# Patient Record
Sex: Female | Born: 1975
Health system: Southern US, Community
[De-identification: ages and names within clinical notes are randomized; demographics above are authoritative.]

## PROBLEM LIST (undated history)

## (undated) DIAGNOSIS — K661 Hemoperitoneum: Secondary | ICD-10-CM

## (undated) DIAGNOSIS — O00101 Right tubal pregnancy without intrauterine pregnancy: Secondary | ICD-10-CM

## (undated) DIAGNOSIS — I889 Nonspecific lymphadenitis, unspecified: Principal | ICD-10-CM

## (undated) HISTORY — PX: TONSILLECTOMY: SUR1361

## (undated) HISTORY — DX: Nonspecific lymphadenitis, unspecified: I88.9

## (undated) HISTORY — PX: BREAST SURGERY: SHX581

---

## 2005-10-31 ENCOUNTER — Ambulatory Visit (HOSPITAL_COMMUNITY): Admission: RE | Admit: 2005-10-31 | Discharge: 2005-10-31 | Payer: Self-pay | Admitting: Obstetrics and Gynecology

## 2005-11-21 ENCOUNTER — Encounter: Admission: RE | Admit: 2005-11-21 | Discharge: 2005-11-21 | Payer: Self-pay | Admitting: Obstetrics and Gynecology

## 2006-05-11 ENCOUNTER — Inpatient Hospital Stay (HOSPITAL_COMMUNITY): Admission: AD | Admit: 2006-05-11 | Discharge: 2006-05-11 | Payer: Self-pay | Admitting: Obstetrics and Gynecology

## 2006-05-14 ENCOUNTER — Inpatient Hospital Stay (HOSPITAL_COMMUNITY): Admission: AD | Admit: 2006-05-14 | Discharge: 2006-05-14 | Payer: Self-pay | Admitting: *Deleted

## 2006-12-10 ENCOUNTER — Ambulatory Visit (HOSPITAL_COMMUNITY): Admission: RE | Admit: 2006-12-10 | Discharge: 2006-12-10 | Payer: Self-pay | Admitting: Obstetrics and Gynecology

## 2007-01-07 ENCOUNTER — Ambulatory Visit (HOSPITAL_COMMUNITY): Admission: RE | Admit: 2007-01-07 | Discharge: 2007-01-07 | Payer: Self-pay | Admitting: Obstetrics and Gynecology

## 2007-03-17 ENCOUNTER — Inpatient Hospital Stay (HOSPITAL_COMMUNITY): Admission: AD | Admit: 2007-03-17 | Discharge: 2007-03-17 | Payer: Self-pay | Admitting: Obstetrics and Gynecology

## 2007-12-02 ENCOUNTER — Inpatient Hospital Stay (HOSPITAL_COMMUNITY): Admission: AD | Admit: 2007-12-02 | Discharge: 2007-12-02 | Payer: Self-pay | Admitting: Obstetrics and Gynecology

## 2007-12-13 ENCOUNTER — Encounter: Admission: RE | Admit: 2007-12-13 | Discharge: 2007-12-13 | Payer: Self-pay | Admitting: Family Medicine

## 2010-01-23 ENCOUNTER — Emergency Department (HOSPITAL_COMMUNITY): Admission: EM | Admit: 2010-01-23 | Discharge: 2010-01-23 | Payer: Self-pay | Admitting: Emergency Medicine

## 2010-08-07 ENCOUNTER — Encounter: Payer: Self-pay | Admitting: Obstetrics and Gynecology

## 2011-04-13 LAB — POCT PREGNANCY, URINE: Preg Test, Ur: NEGATIVE

## 2011-04-13 LAB — URINALYSIS, ROUTINE W REFLEX MICROSCOPIC
Bilirubin Urine: NEGATIVE
Bilirubin Urine: NEGATIVE
Glucose, UA: NEGATIVE
Ketones, ur: NEGATIVE
Ketones, ur: NEGATIVE
Nitrite: NEGATIVE
Urobilinogen, UA: 0.2
pH: 5.5
pH: 6.5

## 2011-04-13 LAB — URINE MICROSCOPIC-ADD ON

## 2011-04-13 LAB — RAPID STREP SCREEN (MED CTR MEBANE ONLY)

## 2012-10-27 ENCOUNTER — Emergency Department (HOSPITAL_COMMUNITY)
Admission: EM | Admit: 2012-10-27 | Discharge: 2012-10-27 | Disposition: A | Discharge status: Home / Self Care | Payer: 59 | Attending: Emergency Medicine | Admitting: Emergency Medicine

## 2012-10-27 ENCOUNTER — Encounter (HOSPITAL_COMMUNITY): Payer: Self-pay | Admitting: Emergency Medicine

## 2012-10-27 ENCOUNTER — Emergency Department (HOSPITAL_COMMUNITY): Payer: 59

## 2012-10-27 DIAGNOSIS — R509 Fever, unspecified: Secondary | ICD-10-CM | POA: Insufficient documentation

## 2012-10-27 DIAGNOSIS — Z3202 Encounter for pregnancy test, result negative: Secondary | ICD-10-CM | POA: Insufficient documentation

## 2012-10-27 DIAGNOSIS — R071 Chest pain on breathing: Secondary | ICD-10-CM | POA: Insufficient documentation

## 2012-10-27 DIAGNOSIS — R0789 Other chest pain: Secondary | ICD-10-CM

## 2012-10-27 DIAGNOSIS — R630 Anorexia: Secondary | ICD-10-CM | POA: Insufficient documentation

## 2012-10-27 LAB — BASIC METABOLIC PANEL
BUN: 6 mg/dL (ref 6–23)
Chloride: 97 mEq/L (ref 96–112)
Creatinine, Ser: 0.79 mg/dL (ref 0.50–1.10)
GFR calc Af Amer: >90 mL/min (ref >90)
Glucose, Bld: 101 mg/dL — ABNORMAL HIGH (ref 70–99)

## 2012-10-27 LAB — CBC WITH DIFFERENTIAL/PLATELET
Basophils Absolute: 0 10*3/uL (ref 0.0–0.1)
Basophils Relative: 1 % (ref 0–1)
Eosinophils Absolute: 0 10*3/uL (ref 0.0–0.7)
Eosinophils Relative: 0 % (ref 0–5)
HCT: 36.3 % (ref 36.0–46.0)
MCH: 28.4 pg (ref 26.0–34.0)
MCHC: 35.5 g/dL (ref 30.0–36.0)
Monocytes Absolute: 0.5 10*3/uL (ref 0.1–1.0)
Neutro Abs: 1.3 10*3/uL — ABNORMAL LOW (ref 1.7–7.7)
RDW: 13 % (ref 11.5–15.5)

## 2012-10-27 MED ORDER — IOHEXOL 300 MG/ML  SOLN
100.0000 mL | Freq: Once | INTRAMUSCULAR | Status: AC | PRN
  Administered 2012-10-27: 100 mL via INTRAVENOUS

## 2012-10-27 MED ORDER — ACETAMINOPHEN 500 MG PO TABS
1000.0000 mg | ORAL_TABLET | Freq: Once | ORAL | Status: AC
  Administered 2012-10-27: 1000 mg via ORAL
  Filled 2012-10-27: qty 2

## 2012-10-27 NOTE — ED Notes (Signed)
Pt. Stated, i've had a lump to come up on the rt. Side of my neck since Tuesday.  I've been running a fever , no appetite.

## 2012-10-27 NOTE — ED Notes (Signed)
CT called to pick up pt. IV placed.

## 2012-10-27 NOTE — ED Notes (Signed)
Attempted to place SL x 1.  Pt cried for 15 minutes.  Unable to find access greater than 22.  IV team paged.

## 2012-10-27 NOTE — ED Provider Notes (Signed)
History     CSN: [id number]  Arrival date & time 10/27/12  1044   First MD Initiated Contact with Patient 10/27/12 1124      Chief Complaint  Patient presents with  . Mass    (Consider location/radiation/quality/duration/timing/severity/associated sxs/prior treatment) HPI.....Marland Kitchensensation of lump in the right superior medial chest since Tuesday with associated fever.  No sore throat, earache, stiff neck, neuro deficits, cough, anterior chest pain.  Palpation makes symptoms worse. Severity is moderate. Patient has diminished appetite but is ambulatory and alert.  History reviewed. No pertinent past medical history.  Past Surgical History  Procedure Laterality Date  . Breast surgery      breast reduction    No family history on file.  History  Substance Use Topics  . Smoking status: Not on file  . Smokeless tobacco: Not on file  . Alcohol Use: Yes    OB History   Grav Para Term Preterm Abortions TAB SAB Ect Mult Living                  Review of Systems  All other systems reviewed and are negative.    Allergies  Penicillins  Home Medications   Current Outpatient Rx  Name  Route  Sig  Dispense  Refill  . cephALEXin (KEFLEX) 500 MG capsule   Oral   Take 500 mg by mouth 3 (three) times daily. For 7 days started 4.7.14 ending 4.14.14         . cholecalciferol (VITAMIN D) 1000 UNITS tablet   Oral   Take 1,000 Units by mouth daily.         . Prenatal Vit-Fe Fumarate-FA (PRENATAL MULTIVITAMIN) TABS   Oral   Take 1 tablet by mouth daily at 12 noon.           BP 107/78  Pulse 123  Temp(Src) 99 F (37.2 C) (Oral)  Resp 20  SpO2 99%  LMP 10/13/2012  Physical Exam  Nursing note and vitals reviewed. Constitutional: She is oriented to person, place, and time. She appears well-developed and well-nourished.  HENT:  Head: Normocephalic and atraumatic.  Eyes: Conjunctivae and EOM are normal. Pupils are equal, round, and reactive to light.  Neck: Normal  range of motion. Neck supple.  Cardiovascular: Normal rate, regular rhythm and normal heart sounds.   Pulmonary/Chest: Effort normal and breath sounds normal.  Chest wall examined.   Site of tenderness is just lateral to the right sternocleidomastoid muscle at the insertion to the clavicle and posterior to the clavicle.  Abdominal: Soft. Bowel sounds are normal.  Musculoskeletal: Normal range of motion.  Neurological: She is alert and oriented to person, place, and time.  Skin: Skin is warm and dry.  Psychiatric: She has a normal mood and affect.    ED Course  Procedures (including critical care time)  Labs Reviewed  BASIC METABOLIC PANEL - Abnormal; Notable for the following:    Sodium 132 (*)    Glucose, Bld 101 (*)    All other components within normal limits  CBC WITH DIFFERENTIAL - Abnormal; Notable for the following:    Neutrophils Relative 32 (*)    Neutro Abs 1.3 (*)    Lymphocytes Relative 56 (*)    All other components within normal limits  PREGNANCY, URINE   Ct Soft Tissue Neck W Contrast  10/27/2012  *RADIOLOGY REPORT*  Clinical Data: Right-sided neck swelling.  CT NECK WITH CONTRAST  Technique:  Multidetector CT imaging of the neck was performed with  intravenous contrast.  Contrast: OMNIPAQUE IOHEXOL 300 MG/ML  SOLN  Comparison: None.  Findings: Major and minor salivary glands unremarkable.  No mucosal lesion or fluid accumulation in the neck.  No pathologic adenopathy.  No vascular occlusion.  No significant cellulitis or visible thrombophlebitis.  No acute osseous findings.  Retention cyst left sphenoid sinus 8 x 13 mm without air-fluid level. Normal thyroid No acute sinus disease.  No acute osseous findings.  IMPRESSION: Unremarkable CT neck with contrast.  No mass or significant inflammatory process is evident.   Original Report Authenticated By: Davonna Belling, M.D.    Ct Chest W Contrast  10/27/2012  *RADIOLOGY REPORT*  Clinical Data: Soft tissue mass involving the  superior medial right chest.  Right sided neck swelling.  CT CHEST WITH CONTRAST  Technique:  Multidetector CT imaging of the chest was performed following the standard protocol during bolus administration of intravenous contrast.  Contrast: OMNIPAQUE IOHEXOL 300 MG/ML IV.  Comparison: No prior chest CT.  Neck CT performed concurrently.  Findings: Pulmonary parenchyma clear without evidence of localized airspace consolidation, interstitial disease, or parenchymal nodules or masses.  No pleural effusions.  Central airways patent without significant bronchial wall thickening.  Heart size normal.  No pericardial effusion.  No visible coronary atherosclerosis.  No visible atherosclerosis involving the thoracic or upper abdominal aorta or their visualized branches.  No significant mediastinal, hilar, or axillary lymphadenopathy. Visualized thyroid gland normal in appearance.  Beam hardening streak artifact involving the upper abdomen as the patient was unable to raise the arms.  Despite this limitation, visualized upper abdomen unremarkable.  Bone window images unremarkable.  IMPRESSION: Normal examination.   Original Report Authenticated By: Hulan Saas, M.D.      1. Fever   2. Chest wall tenderness       MDM  White count is normal.   CT scan of the neck and chest were negative for mass or abscess.  Discussed case with Dr. Cliffton Asters infectious disease. Patient will followup in his clinic. Patient understands to return to emergency department anytime if worse.        Donnetta Hutching, MD 10/27/12 [phone]

## 2012-10-29 ENCOUNTER — Ambulatory Visit (INDEPENDENT_AMBULATORY_CARE_PROVIDER_SITE_OTHER): Payer: 59 | Admitting: Infectious Diseases

## 2012-10-29 ENCOUNTER — Encounter: Payer: Self-pay | Admitting: Infectious Diseases

## 2012-10-29 VITALS — BP 114/81 | HR 125 | Temp 98.6°F | Ht 70.0 in | Wt 216.0 lb

## 2012-10-29 DIAGNOSIS — R509 Fever, unspecified: Secondary | ICD-10-CM

## 2012-10-29 LAB — RPR

## 2012-10-29 LAB — COMPREHENSIVE METABOLIC PANEL
ALT: 32 U/L (ref 0–35)
Alkaline Phosphatase: 32 U/L — ABNORMAL LOW (ref 39–117)
Creat: 0.92 mg/dL (ref 0.50–1.10)
Sodium: 135 mEq/L (ref 135–145)
Total Bilirubin: 0.3 mg/dL (ref 0.3–1.2)
Total Protein: 8 g/dL (ref 6.0–8.3)

## 2012-10-29 LAB — TSH: TSH: 0.86 u[IU]/mL (ref 0.350–4.500)

## 2012-10-29 NOTE — Progress Notes (Signed)
  Subjective:    Patient ID: Nicole Alexander, female    DOB: 1976-03-21, 37 y.o.   MRN: [id number]  HPI  37yo F with FUO for the last week. Had fever up to 103 this AM. She had a R cervical LN noted and was seen in urgent care on 4-9. Had influenza/strep thoat (-) and was put on Kephlex. Swelling in neck has improved but LN still hurts.   Was seen in ED on -12 and had CT neck and chest (-) as well as lab working revealing  Mild decrease in NA and mild increase in Lymphocytes.   Review of Systems  Constitutional: Positive for fever, appetite change and unexpected weight change.  HENT: Negative for mouth sores and trouble swallowing.   Respiratory: Negative for cough and shortness of breath.   Gastrointestinal: Positive for diarrhea. Negative for nausea.  Genitourinary: Negative for difficulty urinating and menstrual problem.  Musculoskeletal: Negative for myalgias and arthralgias.  Hematological: Positive for adenopathy.  lost 7# Feels like she has a "coated tongue"     Objective:   Physical Exam  Constitutional: She appears well-developed and well-nourished.  HENT:  Mouth/Throat: No oropharyngeal exudate.  Eyes: EOM are normal. Pupils are equal, round, and reactive to light.  Neck: Neck supple.  Cardiovascular: Normal rate, regular rhythm and normal heart sounds.   Pulmonary/Chest: Effort normal and breath sounds normal.  Abdominal: Soft. Bowel sounds are normal. There is no tenderness.  Musculoskeletal: Normal range of motion. She exhibits no edema and no tenderness.  Lymphadenopathy:    She has cervical adenopathy.       Right cervical: Superficial cervical adenopathy present.    She has axillary adenopathy.       Right axillary: Pectoral adenopathy present.       Left axillary: No pectoral and no lateral adenopathy present.         Assessment & Plan:

## 2012-10-29 NOTE — Addendum Note (Signed)
Addended by: Jersey Espinoza C on: 10/29/2012 02:41 PM   Modules accepted: Orders

## 2012-10-29 NOTE — Addendum Note (Signed)
Addended by: Mariea Clonts D on: 10/29/2012 03:25 PM   Modules accepted: Orders

## 2012-10-29 NOTE — Assessment & Plan Note (Signed)
The etiology of her syndrome is unclear. There are several syndromes which can give fever and LAN- acute HIV, Mono (EBV, CMV, Toxo), SLE. She states she has had HIV (-) recently, will repeat this. Will screen her broadly for these as well as check BCx. Explained to her if above w/u is (-), would consider LN bx. rtc 1-2 weeks

## 2012-10-30 LAB — ANA: Anti Nuclear Antibody(ANA): NEGATIVE

## 2012-10-30 LAB — EPSTEIN-BARR VIRUS VCA, IGM: EBV VCA IgM: <10 U/mL (ref <36.0)

## 2012-10-30 LAB — CBC WITH DIFFERENTIAL/PLATELET
Basophils Relative: 1 % (ref 0–1)
Eosinophils Absolute: 0 10*3/uL (ref 0.0–0.7)
Eosinophils Relative: 0 % (ref 0–5)
MCH: 27.2 pg (ref 26.0–34.0)
MCHC: 33.7 g/dL (ref 30.0–36.0)
MCV: 80.8 fL (ref 78.0–100.0)
Neutrophils Relative %: 26 % — ABNORMAL LOW (ref 43–77)
Platelets: 237 10*3/uL (ref 150–400)
RDW: 13.8 % (ref 11.5–15.5)

## 2012-10-30 LAB — EPSTEIN-BARR VIRUS VCA, IGG: EBV VCA IgG: >750 U/mL — ABNORMAL HIGH (ref <18.0)

## 2012-10-30 LAB — EPSTEIN-BARR VIRUS EARLY D ANTIGEN ANTIBODY, IGG: EBV EA IgG: <5 U/mL (ref <9.0)

## 2012-10-30 LAB — HIV-1 RNA QUANT-NO REFLEX-BLD: HIV-1 RNA Quant, Log: <1.3 {Log} (ref <1.30)

## 2012-10-31 LAB — CMV IGM: CMV IgM: <8 AU/mL (ref <30.00)

## 2012-11-04 LAB — CULTURE, BLOOD (SINGLE): Organism ID, Bacteria: NO GROWTH

## 2012-11-05 ENCOUNTER — Telehealth: Payer: Self-pay | Admitting: *Deleted

## 2012-11-05 ENCOUNTER — Encounter: Payer: Self-pay | Admitting: *Deleted

## 2012-11-05 NOTE — Telephone Encounter (Signed)
Pt's labs are unremarkable with the exception of past exposure to EBV (mono virus). If she still has swollen lymphnode would send her for biopsy.

## 2012-11-05 NOTE — Telephone Encounter (Signed)
Patient calling for lab results from last Monday.  Per patient, her symptoms remain (temperature of 101-102, no appetite, weak but drinking fluids, tongue still coated).  Patient notified that her cultures were negative.  Please advise if she needs additional lab results read to her. Andree Coss, RN

## 2012-11-06 NOTE — Telephone Encounter (Signed)
Patient notified.  Lymph nodes are tender, but no longer swollen.  Will f/u with her previously scheduled appointment Monday.

## 2012-11-12 ENCOUNTER — Encounter: Payer: Self-pay | Admitting: Infectious Diseases

## 2012-11-12 ENCOUNTER — Ambulatory Visit (INDEPENDENT_AMBULATORY_CARE_PROVIDER_SITE_OTHER): Payer: 59 | Admitting: Infectious Diseases

## 2012-11-12 VITALS — BP 118/86 | HR 84 | Temp 98.5°F | Ht 69.0 in | Wt 217.0 lb

## 2012-11-12 DIAGNOSIS — R509 Fever, unspecified: Secondary | ICD-10-CM

## 2012-11-12 LAB — CBC WITH DIFFERENTIAL/PLATELET
Basophils Absolute: 0.1 10*3/uL (ref 0.0–0.1)
Basophils Relative: 1 % (ref 0–1)
Eosinophils Absolute: 0.3 10*3/uL (ref 0.0–0.7)
Eosinophils Relative: 3 % (ref 0–5)
HCT: 34.8 % — ABNORMAL LOW (ref 36.0–46.0)
MCHC: 33.9 g/dL (ref 30.0–36.0)
MCV: 80.2 fL (ref 78.0–100.0)
Monocytes Absolute: 0.7 10*3/uL (ref 0.1–1.0)
Neutro Abs: 3.5 10*3/uL (ref 1.7–7.7)
RDW: 14 % (ref 11.5–15.5)

## 2012-11-12 NOTE — Progress Notes (Signed)
  Subjective:    Patient ID: Nicole Alexander, female    DOB: 1975/09/06, 37 y.o.   MRN: [id number]  HPI 36yo F with FUO. Had fever up to 103 prior to eval in ID clinic on 10-29-12. She had a R cervical LN noted and was seen in urgent care on 4-9. Had influenza/strep thoat (-) and was put on Kephlex. Swelling in neck has improved but LN still hurts.  Was seen in ED on 4-12 and had CT neck and chest (-) as well as lab working revealing Mild decrease in neutrophils and mild increase in Lymphocytes.  Today she feels better except she has no appetite. Her LN has resolved. Low energy levels.  Sleeping +/-. Restless.   Had mammo earlier this year.   Review of Systems  Constitutional: Negative for fever and chills.  Gastrointestinal: Negative for diarrhea and constipation.  Genitourinary: Negative for difficulty urinating.       Objective:   Physical Exam  Constitutional: She appears well-developed and well-nourished. No distress.  HENT:  Mouth/Throat: No oropharyngeal exudate.  Eyes: EOM are normal. Pupils are equal, round, and reactive to light.  Neck: Neck supple.  Cardiovascular: Normal rate, regular rhythm and normal heart sounds.   Pulmonary/Chest: Effort normal and breath sounds normal.  Abdominal: Soft. Bowel sounds are normal. There is no tenderness.  Lymphadenopathy:    She has no cervical adenopathy.          Assessment & Plan:

## 2012-11-12 NOTE — Assessment & Plan Note (Signed)
She has improved. The only thing I have to blame are her very elevated EBV Ig (although not EA and not IgM). It is possible she could have a primary blood d/o (her ANC was 900). Will repeat her CBC with diff today. If still abn, will give her anbx in the am for her dental procedure. Will also have her seen by heme.

## 2012-11-13 ENCOUNTER — Telehealth: Payer: Self-pay | Admitting: Licensed Clinical Social Worker

## 2012-11-13 NOTE — Telephone Encounter (Signed)
Her WBC has returned to normal. She can procede with surgery, normal peri-procedure anbx

## 2012-11-13 NOTE — Telephone Encounter (Signed)
Patient called wanting lab results and to know if she can have dental surgery. Please advise

## 2012-11-24 ENCOUNTER — Emergency Department (HOSPITAL_COMMUNITY): Payer: 59

## 2012-11-24 ENCOUNTER — Inpatient Hospital Stay (HOSPITAL_COMMUNITY)
Admission: EM | Admit: 2012-11-24 | Discharge: 2012-11-29 | DRG: 418 | Disposition: A | Discharge status: Home / Self Care | Payer: 59 | Attending: General Surgery | Admitting: General Surgery

## 2012-11-24 ENCOUNTER — Encounter (HOSPITAL_COMMUNITY): Payer: Self-pay | Admitting: Emergency Medicine

## 2012-11-24 DIAGNOSIS — K8042 Calculus of bile duct with acute cholecystitis without obstruction: Secondary | ICD-10-CM | POA: Diagnosis present

## 2012-11-24 DIAGNOSIS — K810 Acute cholecystitis: Secondary | ICD-10-CM

## 2012-11-24 DIAGNOSIS — K859 Acute pancreatitis without necrosis or infection, unspecified: Principal | ICD-10-CM | POA: Diagnosis present

## 2012-11-24 DIAGNOSIS — R509 Fever, unspecified: Secondary | ICD-10-CM | POA: Diagnosis present

## 2012-11-24 LAB — COMPREHENSIVE METABOLIC PANEL
AST: 198 U/L — ABNORMAL HIGH (ref 0–37)
Albumin: 3.6 g/dL (ref 3.5–5.2)
Calcium: 9.2 mg/dL (ref 8.4–10.5)
Creatinine, Ser: 0.77 mg/dL (ref 0.50–1.10)
Total Protein: 7.7 g/dL (ref 6.0–8.3)

## 2012-11-24 LAB — POCT PREGNANCY, URINE: Preg Test, Ur: NEGATIVE

## 2012-11-24 LAB — URINALYSIS, ROUTINE W REFLEX MICROSCOPIC
Nitrite: NEGATIVE
Specific Gravity, Urine: 1.01 (ref 1.005–1.030)
Urobilinogen, UA: 1 mg/dL (ref 0.0–1.0)

## 2012-11-24 LAB — URINE MICROSCOPIC-ADD ON

## 2012-11-24 LAB — LACTIC ACID, PLASMA: Lactic Acid, Venous: 1.6 mmol/L (ref 0.5–2.2)

## 2012-11-24 MED ORDER — MORPHINE SULFATE 4 MG/ML IJ SOLN
4.0000 mg | Freq: Once | INTRAMUSCULAR | Status: DC
  Administered 2012-11-24: 4 mg via INTRAVENOUS
  Filled 2012-11-24: qty 1

## 2012-11-24 MED ORDER — SODIUM CHLORIDE 0.9 % IV BOLUS (SEPSIS)
1000.0000 mL | Freq: Once | INTRAVENOUS | Status: AC
  Administered 2012-11-25: 1000 mL via INTRAVENOUS

## 2012-11-24 MED ORDER — ONDANSETRON HCL 4 MG/2ML IJ SOLN
4.0000 mg | Freq: Once | INTRAMUSCULAR | Status: AC
  Administered 2012-11-24: 4 mg via INTRAVENOUS
  Filled 2012-11-24: qty 2

## 2012-11-24 MED ORDER — HYDROMORPHONE HCL PF 1 MG/ML IJ SOLN
1.0000 mg | Freq: Once | INTRAMUSCULAR | Status: DC
  Filled 2012-11-24: qty 1

## 2012-11-24 NOTE — ED Notes (Signed)
States she went to PCP yesterday due to abdominal pain and acid reflux. Lab work was draw, PCP office called her today and told her that her liver enzymes were elevated, to come to ED if pain worsens.

## 2012-11-24 NOTE — ED Provider Notes (Signed)
History     CSN: [id number]  Arrival date & time 11/24/12  1900   First MD Initiated Contact with Patient 11/24/12 2021      Chief Complaint  Patient presents with  . Abdominal Pain    (Consider location/radiation/quality/duration/timing/severity/associated sxs/prior treatment) The history is provided by the patient and medical records. No language interpreter was used.    Nicole Alexander is a 37 y.o. female  with a no medical hx presents to the Emergency Department complaining of gradual, persistent, progressively worsening RUQ abd pain onset 5 days ago with worsening today.  Pt saw PCP yesterday and was given medications for GERD.  PCP contacted patient last night to inform patient that her liver enzymes were elevated and if her pain increased she was to present to the emergency department. Patient with increasing pain today located in the right upper quadrant and epigastric area, rated at a 10 out of 10 without radiation. Patient with associated nausea and vomiting for several days and states she is able to hold down light foods but heavy meals and decrease and his vomiting almost immediately.  By the time patient presented to the emergency Department nothing makes her pain better. Pt denies fever, chills, headache, neck pain chest pain, shortness of breath, diarrhea, weakness, dizziness, syncope, dysuria, hematuria, vaginal discharge, vaginal bleeding.    History reviewed. No pertinent past medical history.  Past Surgical History  Procedure Laterality Date  . Breast surgery      breast reduction    Family History  Problem Relation Age of Onset  . Heart disease Mother     PTCA/stent  . Hypertension Mother   . Diabetes Mother   . Hyperlipidemia Mother   . Cancer Father     lung  . Hypertension Father   . Diabetes Father   . Hyperlipidemia Father   . Hypertension Brother     History  Substance Use Topics  . Smoking status: Never Smoker   . Smokeless tobacco:  Never Used  . Alcohol Use: Yes     Comment: occasional    OB History   Grav Para Term Preterm Abortions TAB SAB Ect Mult Living                  Review of Systems  Constitutional: Negative for fever, diaphoresis, appetite change, fatigue and unexpected weight change.  HENT: Negative for mouth sores, trouble swallowing, neck pain and neck stiffness.   Respiratory: Negative for cough, chest tightness, shortness of breath, wheezing and stridor.   Cardiovascular: Negative for chest pain and palpitations.  Gastrointestinal: Positive for nausea, vomiting and abdominal pain. Negative for diarrhea, constipation, blood in stool, abdominal distention and rectal pain.  Genitourinary: Negative for dysuria, urgency, frequency, hematuria, flank pain and difficulty urinating.  Musculoskeletal: Negative for back pain.  Skin: Negative for rash.  Neurological: Negative for weakness.  Hematological: Negative for adenopathy.  Psychiatric/Behavioral: Negative for confusion.  All other systems reviewed and are negative.    Allergies  Penicillins  Home Medications   Current Outpatient Rx  Name  Route  Sig  Dispense  Refill  . cholecalciferol (VITAMIN D) 1000 UNITS tablet   Oral   Take 1,000 Units by mouth daily.         Marland Kitchen lubiprostone (AMITIZA) 8 MCG capsule   Oral   Take 8 mcg by mouth 2 (two) times daily with a meal.         . Prenatal Vit-Fe Fumarate-FA (PRENATAL MULTIVITAMIN) TABS  Oral   Take 1 tablet by mouth daily at 12 noon.         . Probiotic Product (ALIGN) 4 MG CAPS   Oral   Take 1 capsule by mouth 2 (two) times daily.           BP 128/78  Pulse 83  Temp(Src) 97.8 F (36.6 C) (Oral)  Resp 16  SpO2 100%  LMP 11/17/2012  Physical Exam  Nursing note and vitals reviewed. Constitutional: She appears well-developed and well-nourished. She appears distressed.  Ill appearing   HENT:  Head: Normocephalic and atraumatic.  Mouth/Throat: Oropharynx is clear and  moist.  Eyes: Conjunctivae are normal. No scleral icterus.  Cardiovascular: Normal rate, regular rhythm, normal heart sounds and intact distal pulses.   No murmur heard. Pulmonary/Chest: Effort normal and breath sounds normal. No respiratory distress. She has no wheezes. She has no rales. She exhibits no tenderness.  Abdominal: Soft. Normal appearance and bowel sounds are normal. She exhibits no distension and no mass. There is tenderness in the right upper quadrant, epigastric area and left upper quadrant. There is guarding and positive Murphy's sign. There is no rigidity, no rebound, no CVA tenderness and no tenderness at McBurney's point.  Significant tenderness to the RUQ and epigastric region on palpation  Neurological: She is alert.  Skin: Skin is warm and dry.  Psychiatric: She has a normal mood and affect.    ED Course  Procedures (including critical care time)  Labs Reviewed  COMPREHENSIVE METABOLIC PANEL - Abnormal; Notable for the following:    Glucose, Bld 165 (*)    BUN 4 (*)    AST 198 (*)    ALT 590 (*)    Alkaline Phosphatase 133 (*)    Total Bilirubin 2.1 (*)    All other components within normal limits  LIPASE, BLOOD - Abnormal; Notable for the following:    Lipase >3000 (*)    All other components within normal limits  URINALYSIS, ROUTINE W REFLEX MICROSCOPIC - Abnormal; Notable for the following:    Color, Urine AMBER (*)    APPearance CLOUDY (*)    Bilirubin Urine MODERATE (*)    Leukocytes, UA SMALL (*)    All other components within normal limits  URINE MICROSCOPIC-ADD ON - Abnormal; Notable for the following:    Squamous Epithelial / LPF FEW (*)    Bacteria, UA FEW (*)    All other components within normal limits  URINE CULTURE  LACTIC ACID, PLASMA  POCT PREGNANCY, URINE   Dg Chest 2 View  11/24/2012  *RADIOLOGY REPORT*  Clinical Data: Upper abdominal pain.  CHEST - 2 VIEW  Comparison: CT chest 10/27/2012.  Findings: Lungs are clear.  Heart size is  normal.  No pneumothorax or pleural fluid.  IMPRESSION: Negative chest.   Original Report Authenticated By: Holley Dexter, M.D.    US Abdomen Complete  11/25/2012  *RADIOLOGY REPORT*  Clinical Data:  Abdominal pain especially right upper quadrant, nausea, vomiting  ULTRASOUND ABDOMEN:  Technique:  Sonography of upper abdominal structures was performed.  Comparison:  None  Gallbladder:  Multiple shadowing calculi within gallbladder up to 13 mm diameter.  Mild gallbladder wall thickening.  No definite pericholecystic fluid.  Sonographic Eulah Pont sign is present.  Common bile duct:  Dilated 10 mm diameter.  No discrete visualized intraluminal stone is seen.  Liver:  Echogenic, likely fatty infiltration, though this can be seen with cirrhosis and certain infiltrative disorders.  Question mild focal sparing adjacent to  the gallbladder fossa.  No discrete hepatic mass or nodularity.  Hepatopetal portal venous flow.  IVC:  Normal appearance  Pancreas:  Normal appearance  Spleen:  Limited visualization due to high position under diaphragm, approximately 4 cm length.  Right kidney:  11.7 cm length. Normal morphology without mass or hydronephrosis.  Left kidney:  11.2 cm length. Normal morphology without mass or hydronephrosis.  Aorta:  Predominately obscured by bowel gas  Other:  No free fluid  IMPRESSION: Cholelithiasis or gallbladder wall thickening and presence of a sonographic Murphy sign consistent with acute cholecystitis. Dilated CBD 10 mm diameter, cannot exclude distal obstruction by mass or calculus; recommend correlation with LFTs. Probable fatty infiltration of liver as above.   Original Report Authenticated By: Ulyses Southward, M.D.      1. Acute cholecystitis   2. Pancreatitis       MDM  Nicole Alexander presents with abdominal pain, RUQ in nature.  Pt with intense pain, significant TTP. Hx and PE concerning for gallstones, pancreatitis, and/or acute cholecystitis.  Pt afebrile, without  tachycardia or hypoxia on exam.  Will give pain control, antiemetics and keep NPO pending labs and imaging.  Pt with lipase >3000 indicative of pancreatitis.  CBC with mildly elevated white blood cell count of 11.5. CMP with elevated transaminases, alk phos and bilirubin.  [place] abd with evidence of acute cholecystitis along with a dilated common bile duct concerning for choledocolithiasis.  UA without evidence of a UTI, lactic acid WNL.  CXR unremarkable.  I personally reviewed the imaging tests through PACS system.  I reviewed available ER/hospitalization records through the EMR.  Pt will need admission and general surgery consult.  Pt does not meet SIRS or Sepsis criteria.    Pt pain controlled at this time.  Discussed with Dr Magnus Ivan of general surgery who will admit.            Dahlia Client Kobie Whidby, PA-C 11/25/12 0127  Dierdre Forth, PA-C 11/25/12 1610

## 2012-11-24 NOTE — ED Notes (Signed)
Pt states abdominal pain bilateral upper quadrants underneath ribs.

## 2012-11-25 DIAGNOSIS — K859 Acute pancreatitis without necrosis or infection, unspecified: Secondary | ICD-10-CM

## 2012-11-25 DIAGNOSIS — K8020 Calculus of gallbladder without cholecystitis without obstruction: Secondary | ICD-10-CM

## 2012-11-25 LAB — COMPREHENSIVE METABOLIC PANEL
ALT: 500 U/L — ABNORMAL HIGH (ref 0–35)
Calcium: 9.1 mg/dL (ref 8.4–10.5)
Creatinine, Ser: 0.63 mg/dL (ref 0.50–1.10)
GFR calc Af Amer: >90 mL/min (ref >90)
Glucose, Bld: 118 mg/dL — ABNORMAL HIGH (ref 70–99)
Sodium: 137 mEq/L (ref 135–145)
Total Protein: 7 g/dL (ref 6.0–8.3)

## 2012-11-25 LAB — CBC WITH DIFFERENTIAL/PLATELET
Basophils Absolute: 0 10*3/uL (ref 0.0–0.1)
Basophils Relative: 0 % (ref 0–1)
Hemoglobin: 11.5 g/dL — ABNORMAL LOW (ref 12.0–15.0)
MCHC: 33.3 g/dL (ref 30.0–36.0)
Monocytes Relative: 8 % (ref 3–12)
Neutro Abs: 7.8 10*3/uL — ABNORMAL HIGH (ref 1.7–7.7)
Neutrophils Relative %: 68 % (ref 43–77)

## 2012-11-25 LAB — CBC
Hemoglobin: 10.8 g/dL — ABNORMAL LOW (ref 12.0–15.0)
MCH: 27.5 pg (ref 26.0–34.0)
Platelets: 270 10*3/uL (ref 150–400)
RBC: 3.93 MIL/uL (ref 3.87–5.11)
WBC: 10.8 10*3/uL — ABNORMAL HIGH (ref 4.0–10.5)

## 2012-11-25 MED ORDER — CIPROFLOXACIN IN D5W 400 MG/200ML IV SOLN
400.0000 mg | Freq: Two times a day (BID) | INTRAVENOUS | Status: DC
  Administered 2012-11-25 – 2012-11-29 (×9): 400 mg via INTRAVENOUS
  Filled 2012-11-25 (×10): qty 200

## 2012-11-25 MED ORDER — ONDANSETRON HCL 4 MG/2ML IJ SOLN
4.0000 mg | Freq: Four times a day (QID) | INTRAMUSCULAR | Status: DC | PRN
  Administered 2012-11-26 – 2012-11-28 (×4): 4 mg via INTRAVENOUS
  Filled 2012-11-25 (×3): qty 2

## 2012-11-25 MED ORDER — ENOXAPARIN SODIUM 40 MG/0.4ML ~~LOC~~ SOLN
40.0000 mg | SUBCUTANEOUS | Status: DC
  Administered 2012-11-26 – 2012-11-27 (×2): 40 mg via SUBCUTANEOUS
  Filled 2012-11-25 (×4): qty 0.4

## 2012-11-25 MED ORDER — PANTOPRAZOLE SODIUM 40 MG IV SOLR
40.0000 mg | Freq: Every day | INTRAVENOUS | Status: DC
  Administered 2012-11-25 – 2012-11-28 (×4): 40 mg via INTRAVENOUS
  Filled 2012-11-25 (×6): qty 40

## 2012-11-25 MED ORDER — HYDROMORPHONE HCL PF 1 MG/ML IJ SOLN
1.0000 mg | INTRAMUSCULAR | Status: DC | PRN
  Administered 2012-11-25 – 2012-11-29 (×12): 1 mg via INTRAVENOUS
  Filled 2012-11-25 (×11): qty 1

## 2012-11-25 MED ORDER — DIPHENHYDRAMINE HCL 50 MG/ML IJ SOLN
12.5000 mg | Freq: Four times a day (QID) | INTRAMUSCULAR | Status: DC | PRN
  Administered 2012-11-25 – 2012-11-28 (×8): 25 mg via INTRAVENOUS
  Filled 2012-11-25 (×8): qty 1

## 2012-11-25 MED ORDER — POTASSIUM CHLORIDE IN NACL 20-0.9 MEQ/L-% IV SOLN
INTRAVENOUS | Status: DC
  Administered 2012-11-25 – 2012-11-26 (×5): via INTRAVENOUS
  Administered 2012-11-26: 1000 mL via INTRAVENOUS
  Administered 2012-11-27 (×3): via INTRAVENOUS
  Filled 2012-11-25 (×19): qty 1000

## 2012-11-25 MED ORDER — HYDROMORPHONE HCL PF 1 MG/ML IJ SOLN
1.0000 mg | Freq: Once | INTRAMUSCULAR | Status: AC
  Administered 2012-11-25: 1 mg via INTRAVENOUS

## 2012-11-25 NOTE — H&P (Signed)
Nicole Alexander is an 37 y.o. female.   Chief Complaint: Right upper quadrant and epigastric abdominal pain HPI: This is a pleasant 37 year old female who presents with a several-day history of sharp and crampy right upper quadrant and epigastric abdominal pain. She has had some nausea and vomiting. She has also had fevers. She has just gotten over mononucleosis. Bowel movements are normal. The pain refers through to the back this moderate in intensity  History reviewed. No pertinent past medical history.  Past Surgical History  Procedure Laterality Date  . Breast surgery      breast reduction    Family History  Problem Relation Age of Onset  . Heart disease Mother     PTCA/stent  . Hypertension Mother   . Diabetes Mother   . Hyperlipidemia Mother   . Cancer Father     lung  . Hypertension Father   . Diabetes Father   . Hyperlipidemia Father   . Hypertension Brother    Social History:  reports that she has never smoked. She has never used smokeless tobacco. She reports that  drinks alcohol. She reports that she does not use illicit drugs.  Allergies:  Allergies  Allergen Reactions  . Penicillins Swelling    "swole up like a balloon"    Medications Prior to Admission  Medication Sig Dispense Refill  . cholecalciferol (VITAMIN D) 1000 UNITS tablet Take 1,000 Units by mouth daily.      Marland Kitchen lubiprostone (AMITIZA) 8 MCG capsule Take 8 mcg by mouth 2 (two) times daily with a meal.      . Prenatal Vit-Fe Fumarate-FA (PRENATAL MULTIVITAMIN) TABS Take 1 tablet by mouth daily at 12 noon.      . Probiotic Product (ALIGN) 4 MG CAPS Take 1 capsule by mouth 2 (two) times daily.        Results for orders placed during the hospital encounter of 11/24/12 (from the past 48 hour(s))  COMPREHENSIVE METABOLIC PANEL     Status: Abnormal   Collection Time    11/24/12  8:02 PM      Result Value Range   Sodium 139  135 - 145 mEq/L   Potassium 3.6  3.5 - 5.1 mEq/L   Chloride 103  96 - 112  mEq/L   CO2 28  19 - 32 mEq/L   Glucose, Bld 165 (*) 70 - 99 mg/dL   BUN 4 (*) 6 - 23 mg/dL   Creatinine, Ser 4.09  0.50 - 1.10 mg/dL   Calcium 9.2  8.4 - 81.1 mg/dL   Total Protein 7.7  6.0 - 8.3 g/dL   Albumin 3.6  3.5 - 5.2 g/dL   AST 914 (*) 0 - 37 U/L   ALT 590 (*) 0 - 35 U/L   Alkaline Phosphatase 133 (*) 39 - 117 U/L   Total Bilirubin 2.1 (*) 0.3 - 1.2 mg/dL   GFR calc non Af Amer >90  >90 mL/min   GFR calc Af Amer >90  >90 mL/min   Comment:            The eGFR has been calculated     using the CKD EPI equation.     This calculation has not been     validated in all clinical     situations.     eGFR's persistently     <90 mL/min signify     possible Chronic Kidney Disease.  LIPASE, BLOOD     Status: Abnormal   Collection Time  11/24/12  8:02 PM      Result Value Range   Lipase >3000 (*) 11 - 59 U/L  LACTIC ACID, PLASMA     Status: None   Collection Time    11/24/12  8:03 PM      Result Value Range   Lactic Acid, Venous 1.6  0.5 - 2.2 mmol/L  POCT PREGNANCY, URINE     Status: None   Collection Time    11/24/12  8:20 PM      Result Value Range   Preg Test, Ur NEGATIVE  NEGATIVE   Comment:            THE SENSITIVITY OF THIS     METHODOLOGY IS >24 mIU/mL  URINALYSIS, ROUTINE W REFLEX MICROSCOPIC     Status: Abnormal   Collection Time    11/24/12  9:49 PM      Result Value Range   Color, Urine AMBER (*) YELLOW   Comment: BIOCHEMICALS MAY BE AFFECTED BY COLOR   APPearance CLOUDY (*) CLEAR   Specific Gravity, Urine 1.010  1.005 - 1.030   pH 5.5  5.0 - 8.0   Glucose, UA NEGATIVE  NEGATIVE mg/dL   Hgb urine dipstick NEGATIVE  NEGATIVE   Bilirubin Urine MODERATE (*) NEGATIVE   Ketones, ur NEGATIVE  NEGATIVE mg/dL   Protein, ur NEGATIVE  NEGATIVE mg/dL   Urobilinogen, UA 1.0  0.0 - 1.0 mg/dL   Nitrite NEGATIVE  NEGATIVE   Leukocytes, UA SMALL (*) NEGATIVE  URINE MICROSCOPIC-ADD ON     Status: Abnormal   Collection Time    11/24/12  9:49 PM      Result Value  Range   Squamous Epithelial / LPF FEW (*) RARE   WBC, UA 3-6  <3 WBC/hpf   RBC / HPF 0-2  <3 RBC/hpf   Bacteria, UA FEW (*) RARE  CBC WITH DIFFERENTIAL     Status: Abnormal   Collection Time    11/25/12  1:35 AM      Result Value Range   WBC 11.5 (*) 4.0 - 10.5 K/uL   RBC 4.19  3.87 - 5.11 MIL/uL   Hemoglobin 11.5 (*) 12.0 - 15.0 g/dL   HCT 40.9 (*) 81.1 - 91.4 %   MCV 82.3  78.0 - 100.0 fL   MCH 27.4  26.0 - 34.0 pg   MCHC 33.3  30.0 - 36.0 g/dL   RDW 78.2  95.6 - 21.3 %   Platelets 324  150 - 400 K/uL   Neutrophils Relative 68  43 - 77 %   Neutro Abs 7.8 (*) 1.7 - 7.7 K/uL   Lymphocytes Relative 22  12 - 46 %   Lymphs Abs 2.5  0.7 - 4.0 K/uL   Monocytes Relative 8  3 - 12 %   Monocytes Absolute 0.9  0.1 - 1.0 K/uL   Eosinophils Relative 2  0 - 5 %   Eosinophils Absolute 0.2  0.0 - 0.7 K/uL   Basophils Relative 0  0 - 1 %   Basophils Absolute 0.0  0.0 - 0.1 K/uL   Dg Chest 2 View  11/24/2012  *RADIOLOGY REPORT*  Clinical Data: Upper abdominal pain.  CHEST - 2 VIEW  Comparison: CT chest 10/27/2012.  Findings: Lungs are clear.  Heart size is normal.  No pneumothorax or pleural fluid.  IMPRESSION: Negative chest.   Original Report Authenticated By: Holley Dexter, M.D.    US Abdomen Complete  11/25/2012  *RADIOLOGY REPORT*  Clinical Data:  Abdominal pain especially right upper quadrant, nausea, vomiting  ULTRASOUND ABDOMEN:  Technique:  Sonography of upper abdominal structures was performed.  Comparison:  None  Gallbladder:  Multiple shadowing calculi within gallbladder up to 13 mm diameter.  Mild gallbladder wall thickening.  No definite pericholecystic fluid.  Sonographic Eulah Pont sign is present.  Common bile duct:  Dilated 10 mm diameter.  No discrete visualized intraluminal stone is seen.  Liver:  Echogenic, likely fatty infiltration, though this can be seen with cirrhosis and certain infiltrative disorders.  Question mild focal sparing adjacent to the gallbladder fossa.  No  discrete hepatic mass or nodularity.  Hepatopetal portal venous flow.  IVC:  Normal appearance  Pancreas:  Normal appearance  Spleen:  Limited visualization due to high position under diaphragm, approximately 4 cm length.  Right kidney:  11.7 cm length. Normal morphology without mass or hydronephrosis.  Left kidney:  11.2 cm length. Normal morphology without mass or hydronephrosis.  Aorta:  Predominately obscured by bowel gas  Other:  No free fluid  IMPRESSION: Cholelithiasis or gallbladder wall thickening and presence of a sonographic Murphy sign consistent with acute cholecystitis. Dilated CBD 10 mm diameter, cannot exclude distal obstruction by mass or calculus; recommend correlation with LFTs. Probable fatty infiltration of liver as above.   Original Report Authenticated By: Ulyses Southward, M.D.     Review of Systems  Constitutional: Positive for fever.  HENT: Negative.   Eyes: Negative.   Respiratory: Negative.   Cardiovascular: Negative.   Gastrointestinal: Positive for nausea, vomiting and abdominal pain.  Genitourinary: Negative.   Musculoskeletal: Negative.   Skin: Negative.   Neurological: Negative.   Endo/Heme/Allergies: Negative.   Psychiatric/Behavioral: Negative.     Blood pressure 130/86, pulse 84, temperature 98.1 F (36.7 C), temperature source Oral, resp. rate 18, height 5\' 9"  (1.753 m), weight 212 lb 6.4 oz (96.344 kg), last menstrual period 11/17/2012, SpO2 99.00%. Physical Exam  Constitutional: She is oriented to person, place, and time. She appears well-developed and well-nourished. No distress.  HENT:  Head: Normocephalic and atraumatic.  Right Ear: External ear normal.  Left Ear: External ear normal.  Nose: Nose normal.  Mouth/Throat: Oropharynx is clear and moist. No oropharyngeal exudate.  Eyes: Conjunctivae are normal. Pupils are equal, round, and reactive to light. Right eye exhibits no discharge. Left eye exhibits no discharge. No scleral icterus.  Neck: Normal  range of motion. Neck supple. No tracheal deviation present.  Cardiovascular: Normal rate, regular rhythm, normal heart sounds and intact distal pulses.   No murmur heard. Respiratory: Effort normal and breath sounds normal. No respiratory distress. She has no wheezes. She has no rales.  GI: Soft. Bowel sounds are normal. There is tenderness. There is guarding.  There is mild tenderness with guarding in the epigastrium and right upper quadrant. The abdomen is fairly soft  Musculoskeletal: Normal range of motion. She exhibits no edema and no tenderness.  Lymphadenopathy:    She has no cervical adenopathy.  Neurological: She is alert and oriented to person, place, and time.  Skin: Skin is warm and dry. No rash noted. She is not diaphoretic. No erythema.  Psychiatric: Her behavior is normal. Judgment normal.     Assessment/Plan Gallstone pancreatitis with acute cholecystitis  She'll be admitted and started on IV antibiotics and IV rehydration along with bowel rest. She may need a gastroenterology consultation if her liver function tests remain elevated to see if an ERCP is warranted preoperatively. Once her pancreatitis has improved, we will proceed with  a laparoscopic cholecystectomy With cholangiogram. She understands is in agreement.  Himani Corona A 11/25/2012, 6:30 AM

## 2012-11-26 LAB — CBC
HCT: 29.6 % — ABNORMAL LOW (ref 36.0–46.0)
Hemoglobin: 9.9 g/dL — ABNORMAL LOW (ref 12.0–15.0)
MCV: 82.9 fL (ref 78.0–100.0)
RBC: 3.57 MIL/uL — ABNORMAL LOW (ref 3.87–5.11)
RDW: 14.8 % (ref 11.5–15.5)
WBC: 11.2 10*3/uL — ABNORMAL HIGH (ref 4.0–10.5)

## 2012-11-26 LAB — COMPREHENSIVE METABOLIC PANEL
Alkaline Phosphatase: 120 U/L — ABNORMAL HIGH (ref 39–117)
BUN: 5 mg/dL — ABNORMAL LOW (ref 6–23)
CO2: 23 mEq/L (ref 19–32)
Chloride: 105 mEq/L (ref 96–112)
Creatinine, Ser: 0.61 mg/dL (ref 0.50–1.10)
GFR calc Af Amer: >90 mL/min (ref >90)
GFR calc non Af Amer: >90 mL/min (ref >90)
Glucose, Bld: 78 mg/dL (ref 70–99)
Potassium: 3.9 mEq/L (ref 3.5–5.1)
Total Bilirubin: 0.7 mg/dL (ref 0.3–1.2)

## 2012-11-26 LAB — URINE CULTURE

## 2012-11-26 LAB — LIPASE, BLOOD: Lipase: 638 U/L — ABNORMAL HIGH (ref 11–59)

## 2012-11-26 NOTE — Progress Notes (Signed)
Patient ID: Nicole Alexander, female   DOB: [birth year], 37 y.o.   MRN: [id number]    Subjective: Pt feels better today.  Some pain, but much less.  Minimal nausea, but improved  Objective: Vital signs in last 24 hours: Temp:  [97.6 F (36.4 C)-98.2 F (36.8 C)] 97.7 F (36.5 C) (05/12 0500) Pulse Rate:  [75-88] 75 (05/12 0500) Resp:  [18-19] 18 (05/12 0500) BP: (114-135)/(72-84) 135/80 mmHg (05/12 0500) SpO2:  [100 %] 100 % (05/12 0500) Last BM Date: 11/24/12  Intake/Output from previous day: 05/11 0701 - 05/12 0700 In: 3895 [I.V.:3895] Out: 1500 [Urine:1500] Intake/Output this shift: Total I/O In: -  Out: 800 [Urine:800]  PE: Abd: soft, minimal epigastric pain, +BS, ND Heart: regular Lungs: CTAB  Lab Results:   Recent Labs  11/25/12 0931 11/26/12 0644  WBC 10.8* 11.2*  HGB 10.8* 9.9*  HCT 31.8* 29.6*  PLT 270 285   BMET  Recent Labs  11/25/12 0500 11/26/12 0644  NA 137 138  K 3.6 3.9  CL 102 105  CO2 26 23  GLUCOSE 118* 78  BUN 4* 5*  CREATININE 0.63 0.61  CALCIUM 9.1 8.7   PT/INR No results found for this basename: LABPROT, INR,  in the last 72 hours CMP     Component Value Date/Time   NA 138 11/26/2012 0644   K 3.9 11/26/2012 0644   CL 105 11/26/2012 0644   CO2 23 11/26/2012 0644   GLUCOSE 78 11/26/2012 0644   BUN 5* 11/26/2012 0644   CREATININE 0.61 11/26/2012 0644   CREATININE 0.92 10/29/2012 1519   CALCIUM 8.7 11/26/2012 0644   PROT 6.6 11/26/2012 0644   ALBUMIN 2.8* 11/26/2012 0644   AST 63* 11/26/2012 0644   ALT 316* 11/26/2012 0644   ALKPHOS 120* 11/26/2012 0644   BILITOT 0.7 11/26/2012 0644   GFRNONAA >90 11/26/2012 0644   GFRAA >90 11/26/2012 0644   Lipase     Component Value Date/Time   LIPASE 638* 11/26/2012 0644       Studies/Results: Dg Chest 2 View  11/24/2012  *RADIOLOGY REPORT*  Clinical Data: Upper abdominal pain.  CHEST - 2 VIEW  Comparison: CT chest 10/27/2012.  Findings: Lungs are clear.  Heart size is normal.  No  pneumothorax or pleural fluid.  IMPRESSION: Negative chest.   Original Report Authenticated By: Holley Dexter, M.D.    US Abdomen Complete  11/25/2012  *RADIOLOGY REPORT*  Clinical Data:  Abdominal pain especially right upper quadrant, nausea, vomiting  ULTRASOUND ABDOMEN:  Technique:  Sonography of upper abdominal structures was performed.  Comparison:  None  Gallbladder:  Multiple shadowing calculi within gallbladder up to 13 mm diameter.  Mild gallbladder wall thickening.  No definite pericholecystic fluid.  Sonographic Eulah Pont sign is present.  Common bile duct:  Dilated 10 mm diameter.  No discrete visualized intraluminal stone is seen.  Liver:  Echogenic, likely fatty infiltration, though this can be seen with cirrhosis and certain infiltrative disorders.  Question mild focal sparing adjacent to the gallbladder fossa.  No discrete hepatic mass or nodularity.  Hepatopetal portal venous flow.  IVC:  Normal appearance  Pancreas:  Normal appearance  Spleen:  Limited visualization due to high position under diaphragm, approximately 4 cm length.  Right kidney:  11.7 cm length. Normal morphology without mass or hydronephrosis.  Left kidney:  11.2 cm length. Normal morphology without mass or hydronephrosis.  Aorta:  Predominately obscured by bowel gas  Other:  No free fluid  IMPRESSION: Cholelithiasis or  gallbladder wall thickening and presence of a sonographic Murphy sign consistent with acute cholecystitis. Dilated CBD 10 mm diameter, cannot exclude distal obstruction by mass or calculus; recommend correlation with LFTs. Probable fatty infiltration of liver as above.   Original Report Authenticated By: Ulyses Southward, M.D.     Anti-infectives: Anti-infectives   Start     Dose/Rate Route Frequency Ordered Stop   11/25/12 0215  ciprofloxacin (CIPRO) IVPB 400 mg     400 mg 200 mL/hr over 60 Minutes Intravenous Every 12 hours 11/25/12 0203         Assessment/Plan  1. Gallstone pancreatitis  Plan: 1.  Will allow clear liquids today.  NPO after midnight tonight for possible OR tomorrow. 2. TB fell from 2 to 0.6 today.  Suspect she passed a stone.    LOS: 2 days    Isidra Mings E 11/26/2012, 9:18 AM Pager: [phone]

## 2012-11-26 NOTE — Progress Notes (Signed)
Feeling better, lipase improved somewhat.  Plan lap chole once pancreatitis sufficiently resolved.  Plan D/W patient. Patient examined and I agree with the assessment and plan  Violeta Gelinas, MD, MPH, FACS Pager: [phone]  11/26/2012 2:59 PM

## 2012-11-26 NOTE — Progress Notes (Signed)
Nutrition Brief Note  Patient identified on the Malnutrition Screening Tool (MST) Report  Body mass index is 31.35 kg/(m^2). Patient meets criteria for obesity based on current BMI.   Current diet order is clear liquids for surgery tomorrow. Labs and medications reviewed.   Pt admitted with gallstone pancreatitis with acute cholecystitis, planning for surgery tomorrow.  Pt has lost 4 lbs in the past 1 month (2% in 1 month) which is not clinically significant.  No nutrition interventions warranted at this time. If nutrition issues arise, please consult RD. RD to monitor for diet progression s/p resolve of pancreatitis.  Loyce Dys, MS RD LDN Clinical Inpatient Dietitian Pager: [phone] Weekend/After hours pager: [phone]

## 2012-11-26 NOTE — ED Provider Notes (Signed)
Medical screening examination/treatment/procedure(s) were performed by non-physician practitioner and as supervising physician I was immediately available for consultation/collaboration.   Carleene Cooper III, MD 11/26/12 [phone]

## 2012-11-27 LAB — LIPASE, BLOOD: Lipase: 199 U/L — ABNORMAL HIGH (ref 11–59)

## 2012-11-27 MED ORDER — ENOXAPARIN SODIUM 40 MG/0.4ML ~~LOC~~ SOLN
40.0000 mg | SUBCUTANEOUS | Status: DC
  Administered 2012-11-29: 40 mg via SUBCUTANEOUS
  Filled 2012-11-27 (×2): qty 0.4

## 2012-11-27 NOTE — Progress Notes (Signed)
Patient seen and examined.  Feeling better.  Still with some epigastric tenderness.  Lipase improving.

## 2012-11-27 NOTE — Progress Notes (Signed)
Patient ID: Nicole Alexander, female   DOB: 04/30/1976, 37 y.o.   MRN: [id number]    Subjective: Pt still with a little epigastric tenderness, tolerated clear liquids well yesterday.  Objective: Vital signs in last 24 hours: Temp:  [98.1 F (36.7 C)-98.6 F (37 C)] 98.6 F (37 C) (05/13 0622) Pulse Rate:  [73-78] 73 (05/13 0622) Resp:  [16-18] 16 (05/13 0622) BP: (115-146)/(82-90) 146/90 mmHg (05/13 0622) SpO2:  [100 %] 100 % (05/13 0622) Last BM Date: 11/24/12  Intake/Output from previous day: 05/12 0701 - 05/13 0700 In: 4025.8 [P.O.:420; I.V.:3405.8; IV Piggyback:200] Out: 5900 [Urine:5900] Intake/Output this shift:    PE: Abd: soft, mild epigastric and LUQ tenderness, +BS, ND Heart: regular Lungs: CTAB  Lab Results:   Recent Labs  11/25/12 0931 11/26/12 0644  WBC 10.8* 11.2*  HGB 10.8* 9.9*  HCT 31.8* 29.6*  PLT 270 285   BMET  Recent Labs  11/25/12 0500 11/26/12 0644  NA 137 138  K 3.6 3.9  CL 102 105  CO2 26 23  GLUCOSE 118* 78  BUN 4* 5*  CREATININE 0.63 0.61  CALCIUM 9.1 8.7   PT/INR No results found for this basename: LABPROT, INR,  in the last 72 hours CMP     Component Value Date/Time   NA 138 11/26/2012 0644   K 3.9 11/26/2012 0644   CL 105 11/26/2012 0644   CO2 23 11/26/2012 0644   GLUCOSE 78 11/26/2012 0644   BUN 5* 11/26/2012 0644   CREATININE 0.61 11/26/2012 0644   CREATININE 0.92 10/29/2012 1519   CALCIUM 8.7 11/26/2012 0644   PROT 6.6 11/26/2012 0644   ALBUMIN 2.8* 11/26/2012 0644   AST 63* 11/26/2012 0644   ALT 316* 11/26/2012 0644   ALKPHOS 120* 11/26/2012 0644   BILITOT 0.7 11/26/2012 0644   GFRNONAA >90 11/26/2012 0644   GFRAA >90 11/26/2012 0644   Lipase     Component Value Date/Time   LIPASE 199* 11/27/2012 0500       Studies/Results: No results found.  Anti-infectives: Anti-infectives   Start     Dose/Rate Route Frequency Ordered Stop   11/25/12 0215  ciprofloxacin (CIPRO) IVPB 400 mg     400 mg 200 mL/hr over 60  Minutes Intravenous Every 12 hours 11/25/12 0203         Assessment/Plan  1. Gallstone pancreatitis  Plan: 1. Lipase is still 199 and she has some tenderness still.  Likely needs another day of rest.  Will allow clear liquids, then make NPO p MN in anticipation for OR tomorrow.     LOS: 3 days    Anna Livers E 11/27/2012, 9:21 AM Pager: [phone]

## 2012-11-28 ENCOUNTER — Encounter (HOSPITAL_COMMUNITY): Payer: Self-pay | Admitting: Critical Care Medicine

## 2012-11-28 ENCOUNTER — Inpatient Hospital Stay (HOSPITAL_COMMUNITY): Payer: 59 | Admitting: Critical Care Medicine

## 2012-11-28 ENCOUNTER — Inpatient Hospital Stay (HOSPITAL_COMMUNITY): Payer: 59

## 2012-11-28 ENCOUNTER — Encounter (HOSPITAL_COMMUNITY): Admission: EM | Disposition: A | Discharge status: Home / Self Care | Payer: Self-pay | Source: Home / Self Care

## 2012-11-28 DIAGNOSIS — K8010 Calculus of gallbladder with chronic cholecystitis without obstruction: Secondary | ICD-10-CM

## 2012-11-28 HISTORY — PX: CHOLECYSTECTOMY: SHX55

## 2012-11-28 LAB — SURGICAL PCR SCREEN: Staphylococcus aureus: NEGATIVE

## 2012-11-28 SURGERY — LAPAROSCOPIC CHOLECYSTECTOMY WITH INTRAOPERATIVE CHOLANGIOGRAM
Anesthesia: General | Site: Abdomen | Wound class: Clean Contaminated

## 2012-11-28 MED ORDER — PROPOFOL 10 MG/ML IV BOLUS
INTRAVENOUS | Status: DC | PRN
  Administered 2012-11-28: 20 mg via INTRAVENOUS
  Administered 2012-11-28: 160 mg via INTRAVENOUS
  Administered 2012-11-28: 40 mg via INTRAVENOUS

## 2012-11-28 MED ORDER — HYDROMORPHONE HCL PF 1 MG/ML IJ SOLN
INTRAMUSCULAR | Status: AC
  Administered 2012-11-28: 0.5 mg via INTRAVENOUS
  Filled 2012-11-28: qty 1

## 2012-11-28 MED ORDER — BUPIVACAINE-EPINEPHRINE 0.25% -1:200000 IJ SOLN
INTRAMUSCULAR | Status: DC | PRN
  Administered 2012-11-28: 17 mL

## 2012-11-28 MED ORDER — 0.9 % SODIUM CHLORIDE (POUR BTL) OPTIME
TOPICAL | Status: DC | PRN
  Administered 2012-11-28: 1000 mL

## 2012-11-28 MED ORDER — OXYCODONE HCL 5 MG PO TABS
ORAL_TABLET | ORAL | Status: AC
  Filled 2012-11-28: qty 1

## 2012-11-28 MED ORDER — NEOSTIGMINE METHYLSULFATE 1 MG/ML IJ SOLN
INTRAMUSCULAR | Status: DC | PRN
  Administered 2012-11-28: 4 mg via INTRAVENOUS

## 2012-11-28 MED ORDER — OXYCODONE HCL 5 MG/5ML PO SOLN: 5.0000 mg | Freq: Once | ORAL | Status: AC | PRN

## 2012-11-28 MED ORDER — OXYCODONE HCL 5 MG PO TABS
5.0000 mg | ORAL_TABLET | ORAL | Status: DC | PRN
  Administered 2012-11-28 – 2012-11-29 (×5): 10 mg via ORAL
  Filled 2012-11-28 (×5): qty 2

## 2012-11-28 MED ORDER — SODIUM CHLORIDE 0.9 % IV SOLN
INTRAVENOUS | Status: DC | PRN
  Administered 2012-11-28: 11:00:00

## 2012-11-28 MED ORDER — LIDOCAINE HCL (CARDIAC) 20 MG/ML IV SOLN
INTRAVENOUS | Status: DC | PRN
  Administered 2012-11-28: 50 mg via INTRAVENOUS

## 2012-11-28 MED ORDER — SODIUM CHLORIDE 0.9 % IR SOLN
Status: DC | PRN
  Administered 2012-11-28: 1000 mL

## 2012-11-28 MED ORDER — MIDAZOLAM HCL 5 MG/5ML IJ SOLN
INTRAMUSCULAR | Status: DC | PRN
  Administered 2012-11-28: 2 mg via INTRAVENOUS

## 2012-11-28 MED ORDER — FENTANYL CITRATE 0.05 MG/ML IJ SOLN
INTRAMUSCULAR | Status: DC | PRN
  Administered 2012-11-28: 150 ug via INTRAVENOUS
  Administered 2012-11-28: 50 ug via INTRAVENOUS
  Administered 2012-11-28: 25 ug via INTRAVENOUS
  Administered 2012-11-28 (×3): 50 ug via INTRAVENOUS

## 2012-11-28 MED ORDER — BUPIVACAINE-EPINEPHRINE PF 0.25-1:200000 % IJ SOLN
INTRAMUSCULAR | Status: AC
  Filled 2012-11-28: qty 30

## 2012-11-28 MED ORDER — GLYCOPYRROLATE 0.2 MG/ML IJ SOLN
INTRAMUSCULAR | Status: DC | PRN
  Administered 2012-11-28: 0.6 mg via INTRAVENOUS

## 2012-11-28 MED ORDER — DEXAMETHASONE SODIUM PHOSPHATE 4 MG/ML IJ SOLN
INTRAMUSCULAR | Status: DC | PRN
  Administered 2012-11-28: 4 mg via INTRAVENOUS

## 2012-11-28 MED ORDER — ONDANSETRON HCL 4 MG/2ML IJ SOLN
INTRAMUSCULAR | Status: AC
  Filled 2012-11-28: qty 2

## 2012-11-28 MED ORDER — ROCURONIUM BROMIDE 100 MG/10ML IV SOLN
INTRAVENOUS | Status: DC | PRN
  Administered 2012-11-28: 50 mg via INTRAVENOUS

## 2012-11-28 MED ORDER — LACTATED RINGERS IV SOLN
INTRAVENOUS | Status: DC
  Administered 2012-11-28: 10:00:00 via INTRAVENOUS

## 2012-11-28 MED ORDER — OXYCODONE HCL 5 MG PO TABS
5.0000 mg | ORAL_TABLET | Freq: Once | ORAL | Status: AC | PRN
  Administered 2012-11-28: 5 mg via ORAL

## 2012-11-28 MED ORDER — ONDANSETRON HCL 4 MG/2ML IJ SOLN
INTRAMUSCULAR | Status: DC | PRN
  Administered 2012-11-28: 4 mg via INTRAVENOUS

## 2012-11-28 MED ORDER — HYDROMORPHONE HCL PF 1 MG/ML IJ SOLN
0.2500 mg | INTRAMUSCULAR | Status: DC | PRN
  Administered 2012-11-28 (×2): 0.5 mg via INTRAVENOUS

## 2012-11-28 SURGICAL SUPPLY — 48 items
ADH SKN CLS APL DERMABOND .7 (GAUZE/BANDAGES/DRESSINGS) ×1
APPLIER CLIP 5 13 M/L LIGAMAX5 (MISCELLANEOUS) ×4
APPLIER CLIP ROT 10 11.4 M/L (STAPLE)
APR CLP MED LRG 11.4X10 (STAPLE)
APR CLP MED LRG 5 ANG JAW (MISCELLANEOUS) ×2
BAG SPEC RTRVL LRG 6X4 10 (ENDOMECHANICALS) ×1
BLADE SURG ROTATE 9660 (MISCELLANEOUS) IMPLANT
CANISTER SUCTION 2500CC (MISCELLANEOUS) ×2 IMPLANT
CHLORAPREP W/TINT 26ML (MISCELLANEOUS) ×2 IMPLANT
CLIP APPLIE 5 13 M/L LIGAMAX5 (MISCELLANEOUS) ×1 IMPLANT
CLIP APPLIE ROT 10 11.4 M/L (STAPLE) IMPLANT
CLOTH BEACON ORANGE TIMEOUT ST (SAFETY) ×2 IMPLANT
COVER MAYO STAND STRL (DRAPES) ×2 IMPLANT
COVER SURGICAL LIGHT HANDLE (MISCELLANEOUS) ×2 IMPLANT
DECANTER SPIKE VIAL GLASS SM (MISCELLANEOUS) ×3 IMPLANT
DERMABOND ADVANCED (GAUZE/BANDAGES/DRESSINGS) ×1
DERMABOND ADVANCED .7 DNX12 (GAUZE/BANDAGES/DRESSINGS) ×1 IMPLANT
DRAPE C-ARM 42X72 X-RAY (DRAPES) ×2 IMPLANT
DRAPE UTILITY 15X26 W/TAPE STR (DRAPE) ×4 IMPLANT
ELECT REM PT RETURN 9FT ADLT (ELECTROSURGICAL) ×2
ELECTRODE REM PT RTRN 9FT ADLT (ELECTROSURGICAL) ×1 IMPLANT
FILTER SMOKE EVAC LAPAROSHD (FILTER) ×1 IMPLANT
GLOVE BIO SURGEON STRL SZ7.5 (GLOVE) ×1 IMPLANT
GLOVE BIO SURGEON STRL SZ8 (GLOVE) ×2 IMPLANT
GLOVE BIOGEL PI IND STRL 7.0 (GLOVE) IMPLANT
GLOVE BIOGEL PI IND STRL 8 (GLOVE) ×1 IMPLANT
GLOVE BIOGEL PI INDICATOR 7.0 (GLOVE) ×3
GLOVE BIOGEL PI INDICATOR 8 (GLOVE) ×2
GLOVE SURG SS PI 7.0 STRL IVOR (GLOVE) ×2 IMPLANT
GOWN PREVENTION PLUS XLARGE (GOWN DISPOSABLE) ×2 IMPLANT
GOWN STRL NON-REIN LRG LVL3 (GOWN DISPOSABLE) ×6 IMPLANT
KIT BASIN OR (CUSTOM PROCEDURE TRAY) ×2 IMPLANT
KIT ROOM TURNOVER OR (KITS) ×2 IMPLANT
NS IRRIG 1000ML POUR BTL (IV SOLUTION) ×2 IMPLANT
PAD ARMBOARD 7.5X6 YLW CONV (MISCELLANEOUS) ×2 IMPLANT
POUCH SPECIMEN RETRIEVAL 10MM (ENDOMECHANICALS) ×2 IMPLANT
SCISSORS LAP 5X35 DISP (ENDOMECHANICALS) ×1 IMPLANT
SET CHOLANGIOGRAPH 5 50 .035 (SET/KITS/TRAYS/PACK) ×2 IMPLANT
SET IRRIG TUBING LAPAROSCOPIC (IRRIGATION / IRRIGATOR) ×2 IMPLANT
SLEEVE ENDOPATH XCEL 5M (ENDOMECHANICALS) ×4 IMPLANT
SPECIMEN JAR SMALL (MISCELLANEOUS) ×2 IMPLANT
SUT VIC AB 4-0 PS2 27 (SUTURE) ×2 IMPLANT
TOWEL OR 17X24 6PK STRL BLUE (TOWEL DISPOSABLE) ×2 IMPLANT
TOWEL OR 17X26 10 PK STRL BLUE (TOWEL DISPOSABLE) ×2 IMPLANT
TRAY LAPAROSCOPIC (CUSTOM PROCEDURE TRAY) ×2 IMPLANT
TROCAR XCEL BLUNT TIP 100MML (ENDOMECHANICALS) ×2 IMPLANT
TROCAR XCEL NON-BLD 5MMX100MML (ENDOMECHANICALS) ×2 IMPLANT
WATER STERILE IRR 1000ML POUR (IV SOLUTION) IMPLANT

## 2012-11-28 NOTE — Anesthesia Procedure Notes (Signed)
Procedure Name: Intubation Date/Time: 11/28/2012 10:28 AM Performed by: Elon Alas Pre-anesthesia Checklist: Patient identified, Timeout performed, Emergency Drugs available, Suction available and Patient being monitored Patient Re-evaluated:Patient Re-evaluated prior to inductionOxygen Delivery Method: Circle system utilized Preoxygenation: Pre-oxygenation with 100% oxygen Intubation Type: IV induction Ventilation: Mask ventilation without difficulty Laryngoscope Size: Mac and 4 Grade View: Grade I Tube type: Oral Tube size: 7.0 mm Number of attempts: 1 Airway Equipment and Method: Stylet Placement Confirmation: positive ETCO2,  ETT inserted through vocal cords under direct vision and breath sounds checked- equal and bilateral Secured at: 22 cm Tube secured with: Tape Dental Injury: Teeth and Oropharynx as per pre-operative assessment

## 2012-11-28 NOTE — Anesthesia Postprocedure Evaluation (Signed)
  Anesthesia Post-op Note  Patient: Nicole Alexander  Procedure(s) Performed: Procedure(s): LAPAROSCOPIC CHOLECYSTECTOMY WITH INTRAOPERATIVE CHOLANGIOGRAM (N/A)  Patient Location: PACU  Anesthesia Type:General  Level of Consciousness: awake, alert  and oriented  Airway and Oxygen Therapy: Patient Spontanous Breathing  Post-op Pain: mild  Post-op Assessment: Post-op Vital signs reviewed, Patient's Cardiovascular Status Stable, Respiratory Function Stable, Patent Airway and No signs of Nausea or vomiting  Post-op Vital Signs: Reviewed and stable  Complications: No apparent anesthesia complications

## 2012-11-28 NOTE — Op Note (Signed)
11/24/2012 - 11/28/2012  11:27 AM  PATIENT:  Nicole Alexander  37 y.o. female  PRE-OPERATIVE DIAGNOSIS:  gallstone pancreatitis  POST-OPERATIVE DIAGNOSIS:  gallstone pancreatitis  PROCEDURE:  Procedure(s): LAPAROSCOPIC CHOLECYSTECTOMY WITH INTRAOPERATIVE CHOLANGIOGRAM  SURGEON:  Surgeon(s): Liz Malady, MD  PHYSICIAN ASSISTANT:   ASSISTANTS: Zola Button, PAC   ANESTHESIA:   local and general  EBL:  Total I/O In: 700 [I.V.:700] Out: 25 [Blood:25]  BLOOD ADMINISTERED:none  DRAINS: none   SPECIMEN:  Excision  DISPOSITION OF SPECIMEN:  PATHOLOGY  COUNTS:  YES  DICTATION: Reubin Milan Dictation  Patient has Biliary pancreatitis. Her pancreatitis has significantly resolved. She presents for cholecystectomy. She was identified in the preop holding area. Informed consent was obtained. She is on antibiotic protocol. She was brought to the operating room and general endotracheal anesthesia was administered by the anesthesia staff.  Abdomen was prepped and draped in sterile fashion. Time out procedure was done. Infra-umbilical region was infiltrated with quarter percent Marcaine. Infra-umbilical incision was made. Subcutaneous tissues were dissected down revealing the anterior fascia. This was divided along the midline. Peritoneal cavity was entered under direct vision. 0 Vicryl pressuring suture was placed on the fascial opening. Hassan trocar was inserted into the abdomen. Abdomen was insufflated with carbon dioxide in standard fashion. Under direct vision, 2 right-sided 5 mm ports were placed in a 5 mm epigastric port were placed. Local was used at each port site. Dome the gallbladder was retracted superior medially. The infundibulum was retracted inferolaterally. Dissection began laterally and progressed medially. Cystic duct was identified. Dissection continued until critical view was obtained between the cystic duct, the liver, and gallbladder. Once excellent visualization was  obtained, clip was placed on the infundibular cystic duct junction. Small nick was made in the cystic duct. Several small stones were milked back out of the cystic duct. Cook cholangiogram catheter was inserted. Intraoperative cholangiogram Was obtained demonstrating no common bile duct filling defects and good flow of contrast into the duodenum. Cholangiocatheter was removed. 3 clips were placed proximal the cystic duct and it was divided. Further dissection revealed cystic artery. This was clipped twice proximally once distally and divided. Gallbladder was taken off the liver bed with cautery. We did encounter a posterior branch of the cystic artery which was clipped twice proximally and divided distally with cautery. Gallbladder was taken to [facility] off the liver bed and placed in Endo Catch bag. It was removed via the infraumbilical port site. Liver bed was cauterized to get good hemostasis. Clips all remain in good position. Irrigation fluid returned clear. Ports were removed under direct vision. Pneumoperitoneum was released. Informed local fascia was closed by tying the 0 Vicryl pursestring suture with a secure closure. All 4 ones were copiously irrigated and the skin of each was closed with running 4 Vicryl subcuticular followed by Dermabond. All counts were correct. Patient tolerated procedure well without apparent complication was taken recovery in stable condition.  PATIENT DISPOSITION:  PACU - hemodynamically stable.   Delay start of Pharmacological VTE agent (>24hrs) due to surgical blood loss or risk of bleeding:  no  Violeta Gelinas, MD, MPH, FACS Pager: [phone]  5/14/201411:27 AM

## 2012-11-28 NOTE — Progress Notes (Signed)
  Subjective: Feels better, NPO since MN and ready for surgery today.  Objective: Vital signs in last 24 hours: Temp:  [97.7 F (36.5 C)-98.4 F (36.9 C)] 98.1 F (36.7 C) (05/14 0604) Pulse Rate:  [70-77] 71 (05/14 0604) Resp:  [16-18] 16 (05/14 0604) BP: (125-129)/(82-97) 125/82 mmHg (05/14 0604) SpO2:  [100 %] 100 % (05/14 0604) Last BM Date: 11/24/12  Afebrile, VSS, Lipase, 156  Intake/Output from previous day: 05/13 0701 - 05/14 0700 In: 480 [P.O.:480] Out: 5100 [Urine:5100] Intake/Output this shift:    General appearance: alert, cooperative and no distress GI: soft, non-tender; bowel sounds normal; no masses,  no organomegaly  Lab Results:   Recent Labs  11/25/12 0931 11/26/12 0644  WBC 10.8* 11.2*  HGB 10.8* 9.9*  HCT 31.8* 29.6*  PLT 270 285    BMET  Recent Labs  11/26/12 0644  NA 138  K 3.9  CL 105  CO2 23  GLUCOSE 78  BUN 5*  CREATININE 0.61  CALCIUM 8.7   PT/INR No results found for this basename: LABPROT, INR,  in the last 72 hours   Recent Labs Lab 11/24/12 2002 11/25/12 0500 11/26/12 0644  AST 198* 157* 63*  ALT 590* 500* 316*  ALKPHOS 133* 133* 120*  BILITOT 2.1* 2.2* 0.7  PROT 7.7 7.0 6.6  ALBUMIN 3.6 3.1* 2.8*     Lipase     Component Value Date/Time   LIPASE 156* 11/28/2012 0507     Studies/Results: No results found.  Medications: . ciprofloxacin  400 mg Intravenous Q12H  . enoxaparin (LOVENOX) injection  40 mg Subcutaneous Q24H  . pantoprazole (PROTONIX) IV  40 mg Intravenous QHS    Assessment/Plan 1. Gallstone pancreatitis  Plan:  Dr. Janee Morn will see and make a decision about surgery today.  LOS: 4 days    Sher Shampine 11/28/2012

## 2012-11-28 NOTE — Anesthesia Preprocedure Evaluation (Addendum)
Anesthesia Evaluation  Patient identified by MRN, date of birth, ID band Patient awake    Reviewed: Allergy & Precautions, H&P , NPO status , Patient's Chart, lab work & pertinent test results  Airway Mallampati: II TM Distance: >3 FB Neck ROM: Full    Dental no notable dental hx. (+) Dental Advisory Given and Teeth Intact   Pulmonary neg pulmonary ROS,  breath sounds clear to auscultation  Pulmonary exam normal       Cardiovascular negative cardio ROS  Rhythm:Regular Rate:Normal     Neuro/Psych negative neurological ROS  negative psych ROS   GI/Hepatic negative GI ROS, Neg liver ROS,   Endo/Other  negative endocrine ROSMorbid obesity  Renal/GU negative Renal ROS  negative genitourinary   Musculoskeletal   Abdominal   Peds  Hematology negative hematology ROS (+)   Anesthesia Other Findings   Reproductive/Obstetrics negative OB ROS                         Anesthesia Physical Anesthesia Plan  ASA: II  Anesthesia Plan: General   Post-op Pain Management:    Induction: Intravenous  Airway Management Planned: Oral ETT  Additional Equipment:   Intra-op Plan:   Post-operative Plan: Extubation in OR  Informed Consent: I have reviewed the patients History and Physical, chart, labs and discussed the procedure including the risks, benefits and alternatives for the proposed anesthesia with the patient or authorized representative who has indicated his/her understanding and acceptance.   Dental advisory given  Plan Discussed with: Anesthesiologist and Surgeon  Anesthesia Plan Comments:        Anesthesia Quick Evaluation

## 2012-11-28 NOTE — Preoperative (Signed)
Beta Blockers   Reason not to administer Beta Blockers:Not Applicable 

## 2012-11-28 NOTE — Transfer of Care (Signed)
Immediate Anesthesia Transfer of Care Note  Patient: Nicole Alexander  Procedure(s) Performed: Procedure(s): LAPAROSCOPIC CHOLECYSTECTOMY WITH INTRAOPERATIVE CHOLANGIOGRAM (N/A)  Patient Location: PACU  Anesthesia Type:General  Level of Consciousness: awake, alert  and oriented  Airway & Oxygen Therapy: Patient Spontanous Breathing and Patient connected to nasal cannula oxygen  Post-op Assessment: Report given to PACU RN, Post -op Vital signs reviewed and stable and Patient moving all extremities X 4  Post vital signs: Reviewed and stable  Complications: No apparent anesthesia complications

## 2012-11-28 NOTE — Progress Notes (Signed)
For lap chole IOC this AM. I discussed the procedure in detail.  We discussed the risks and benefits of a laparoscopic cholecystectomy and possible cholangiogram including, but not limited to bleeding, infection, injury to surrounding structures such as the intestine or liver, bile leak, retained gallstones, need to convert to an open procedure, prolonged diarrhea, blood clots such as  DVT, common bile duct injury, anesthesia risks, and possible need for additional procedures.  The likelihood of improvement in symptoms and return to the patient's normal status is good. We discussed the typical post-operative recovery course. She agrees

## 2012-11-29 MED ORDER — OXYCODONE HCL 5 MG PO TABS: 5.0000 mg | ORAL_TABLET | ORAL | Status: DC | PRN

## 2012-11-29 MED ORDER — PANTOPRAZOLE SODIUM 40 MG PO TBEC: 40.0000 mg | DELAYED_RELEASE_TABLET | Freq: Every day | ORAL | Status: DC

## 2012-11-29 MED ORDER — CIPROFLOXACIN HCL 500 MG PO TABS: 500.0000 mg | ORAL_TABLET | Freq: Two times a day (BID) | ORAL | Status: DC

## 2012-11-29 NOTE — Discharge Summary (Signed)
Nicole Scharf, MD, MPH, FACS Pager: [phone]  

## 2012-11-29 NOTE — Progress Notes (Signed)
Agree, plan for D/C Violeta Gelinas, MD, MPH, FACS Pager: [phone]

## 2012-11-29 NOTE — Progress Notes (Signed)
1 Day Post-Op  Subjective: Reports soreness, particularly around umbilical incision.  Tolerating clears well.  No nausea or vomiting.  Passing gas.  No bowel movement.  Walking to restroom and in hallway.  Objective: Vital signs in last 24 hours: Temp:  [97.6 F (36.4 C)-98.5 F (36.9 C)] 98.4 F (36.9 C) (05/15 0521) Pulse Rate:  [63-94] 94 (05/15 0521) Resp:  [8-31] 17 (05/15 0521) BP: (129-152)/(76-128) 132/85 mmHg (05/15 0521) SpO2:  [96 %-100 %] 98 % (05/15 0521) Last BM Date: 11/24/12   Intake/Output from previous day: 05/14 0701 - 05/15 0700 In: 2991.8 [P.O.:760; I.V.:1431.8; IV Piggyback:800] Out: 3925 [Urine:3900; Blood:25] Intake/Output this shift: Total I/O In: -  Out: 600 [Urine:600]  General appearance: alert, cooperative and no distress GI: soft, non-tender; bowel sounds normal; no masses,  no organomegaly, incisions clean, dry and intact  Lab Results:  No results found for this basename: WBC, HGB, HCT, PLT,  in the last 72 hours  BMET No results found for this basename: NA, K, CL, CO2, GLUCOSE, BUN, CREATININE, CALCIUM,  in the last 72 hours PT/INR No results found for this basename: LABPROT, INR,  in the last 72 hours   Recent Labs Lab 11/24/12 2002 11/25/12 0500 11/26/12 0644  AST 198* 157* 63*  ALT 590* 500* 316*  ALKPHOS 133* 133* 120*  BILITOT 2.1* 2.2* 0.7  PROT 7.7 7.0 6.6  ALBUMIN 3.6 3.1* 2.8*     Lipase     Component Value Date/Time   LIPASE 156* 11/28/2012 0507     Studies/Results: Dg Cholangiogram Operative  11/28/2012   *RADIOLOGY REPORT*  Clinical Data: Gallstone pancreatitis.  INTRAOPERATIVE CHOLANGIOGRAM  Technique:  Multiple fluoroscopic spot radiographs were obtained during intraoperative cholangiogram and are submitted for interpretation post-operatively.  Comparison: 11/24/2012.  Findings: The gallbladder has been removed and the cystic duct cannulated.  There   is good opacification of the common bile duct, common hepatic  duct, and right and left intrahepatic ducts.  There is prompt passage of contrast into the duodenum. Slight retrograde filling of the proximal pancreatic duct.  IMPRESSION: Negative operative cholangiogram.   No obstruction or filling defects to suggest residual CBD stones.   Original Report Authenticated By: Davonna Belling, M.D.    Medications: . ciprofloxacin  400 mg Intravenous Q12H  . enoxaparin (LOVENOX) injection  40 mg Subcutaneous Q24H  . pantoprazole (PROTONIX) IV  40 mg Intravenous QHS    Assessment/Plan 1. Gallstone pancreatitis - s/p laparoscopic cholecystectomy 5/14  Plan:  - Ambulate as tolerated - will give full liquid tray now and advance to regular diet for lunch - On day 5 of cipro, consider transitioning to oral cipro to complete 7 day course Patient anxious to go home.  She is passing gas and abdomen is not distended.  If she tolerates her regular diet at lunch, she could potentially go home this afternoon.  Discussed this with the patient and final decision will be made after lunch.   LOS: 5 days    BOOTH, Wilhemenia Camba 11/29/2012

## 2012-11-29 NOTE — Discharge Summary (Signed)
Physician Discharge Summary  Patient ID: SHALIE SCHREMP MRN: [id number] DOB/AGE: 22-Apr-1976 37 y.o.  Admit date: 11/24/2012 Discharge date: 11/29/2012  Admitting Diagnosis: Gallstone pancreatitis with acute cholecystitis  Discharge Diagnosis Patient Active Problem List   Diagnosis Date Noted  . FUO (fever of unknown origin) 10/29/2012  Gallstone pancreatitis, s/p lap chole  Consultants None  Imaging: Dg Cholangiogram Operative  11/28/2012   *RADIOLOGY REPORT*  Clinical Data: Gallstone pancreatitis.  INTRAOPERATIVE CHOLANGIOGRAM  Technique:  Multiple fluoroscopic spot radiographs were obtained during intraoperative cholangiogram and are submitted for interpretation post-operatively.  Comparison: 11/24/2012.  Findings: The gallbladder has been removed and the cystic duct cannulated.  There   is good opacification of the common bile duct, common hepatic duct, and right and left intrahepatic ducts.  There is prompt passage of contrast into the duodenum. Slight retrograde filling of the proximal pancreatic duct.  IMPRESSION: Negative operative cholangiogram.   No obstruction or filling defects to suggest residual CBD stones.   Original Report Authenticated By: Davonna Belling, M.D.    Procedures Laparoscopic cholecystectomy with intraoperative cholangiogram  Hospital Course:  Ms. Olazabal is a 37yo female with no significant past medical history who presented to [facility] with RUQ/epigastric abdominal pain, nausea/vomiting and fever.  Abdominal ultrasound revealed cholelithiasis and acute cholecystitis and dilated CBD 10mm diameter.  Patient was admitted and underwent procedure listed above.  Tolerated procedure well and was transferred to the floor.  Diet was advanced as tolerated.  On POD1, the patient was voiding well, tolerating diet, ambulating well, pain well controlled, vital signs stable, incisions c/d/i and felt stable for discharge home.  Patient will follow up in our office in 2  weeks and knows to call with questions or concerns.     Medication List    ASK your doctor about these medications       ALIGN 4 MG Caps  Take 1 capsule by mouth 2 (two) times daily.     cholecalciferol 1000 UNITS tablet  Commonly known as:  VITAMIN D  Take 1,000 Units by mouth daily.     lubiprostone 8 MCG capsule  Commonly known as:  AMITIZA  Take 8 mcg by mouth 2 (two) times daily with a meal.     prenatal multivitamin Tabs  Take 1 tablet by mouth daily at 12 noon.       Follow-Up: [facility] 12/18/12 at 11:00AM   Signed: Marchelle Folks A. Fabienne Bruns, PA-S [facility] [phone]  11/29/2012, 9:53 AM

## 2012-11-30 ENCOUNTER — Encounter (HOSPITAL_COMMUNITY): Payer: Self-pay | Admitting: General Surgery

## 2012-12-11 ENCOUNTER — Telehealth (INDEPENDENT_AMBULATORY_CARE_PROVIDER_SITE_OTHER): Payer: Self-pay

## 2012-12-11 NOTE — Telephone Encounter (Signed)
Patient states she is going to bring in her [employer] papers to be filled with her work today. She states she is doing good denies temp, drainage Advised to call if any changes.

## 2012-12-18 ENCOUNTER — Ambulatory Visit (INDEPENDENT_AMBULATORY_CARE_PROVIDER_SITE_OTHER): Payer: 59 | Admitting: Internal Medicine

## 2012-12-18 ENCOUNTER — Encounter (INDEPENDENT_AMBULATORY_CARE_PROVIDER_SITE_OTHER): Payer: Self-pay | Admitting: Internal Medicine

## 2012-12-18 ENCOUNTER — Encounter (INDEPENDENT_AMBULATORY_CARE_PROVIDER_SITE_OTHER): Payer: Self-pay | Admitting: General Surgery

## 2012-12-18 ENCOUNTER — Encounter (INDEPENDENT_AMBULATORY_CARE_PROVIDER_SITE_OTHER): Payer: 59

## 2012-12-18 VITALS — BP 116/72 | HR 96 | Temp 97.7°F | Resp 16 | Ht 69.0 in | Wt 209.0 lb

## 2012-12-18 DIAGNOSIS — K851 Biliary acute pancreatitis without necrosis or infection: Secondary | ICD-10-CM

## 2012-12-18 DIAGNOSIS — K859 Acute pancreatitis without necrosis or infection, unspecified: Secondary | ICD-10-CM

## 2012-12-18 NOTE — Progress Notes (Signed)
  Subjective: Pt returns to the clinic today after undergoing laparoscopic cholecystectomy on 11/28/12 by Dr. Janee Morn due to gallstone pancreatitis.  The patient is tolerating their diet well and is having no severe pain.  She does have tenderness at the umbilicus.  Bowel function is good.  No problems with the wounds.  Objective: Vital signs in last 24 hours: Reviewed  PE: Abd: soft, non-tender, +bs, incisions well healed, No redness around umbilicus, appears to be scar tissue around suture.  Lab Results:  No results found for this basename: WBC, HGB, HCT, PLT,  in the last 72 hours BMET No results found for this basename: NA, K, CL, CO2, GLUCOSE, BUN, CREATININE, CALCIUM,  in the last 72 hours PT/INR No results found for this basename: LABPROT, INR,  in the last 72 hours CMP     Component Value Date/Time   NA 138 11/26/2012 0644   K 3.9 11/26/2012 0644   CL 105 11/26/2012 0644   CO2 23 11/26/2012 0644   GLUCOSE 78 11/26/2012 0644   BUN 5* 11/26/2012 0644   CREATININE 0.61 11/26/2012 0644   CREATININE 0.92 10/29/2012 1519   CALCIUM 8.7 11/26/2012 0644   PROT 6.6 11/26/2012 0644   ALBUMIN 2.8* 11/26/2012 0644   AST 63* 11/26/2012 0644   ALT 316* 11/26/2012 0644   ALKPHOS 120* 11/26/2012 0644   BILITOT 0.7 11/26/2012 0644   GFRNONAA >90 11/26/2012 0644   GFRAA >90 11/26/2012 0644   Lipase     Component Value Date/Time   LIPASE 156* 11/28/2012 0507       Studies/Results: No results found.  Anti-infectives: Anti-infectives   None       Assessment/Plan  1.  S/P Laparoscopic Cholecystectomy: doing well, reassured her of her symptoms ok, likely just healing ridge, may resume regular activity without restrictions, Pt will follow up with [place] PRN and knows to call with questions or concerns.     WHITE, ELIZABETH 12/18/2012

## 2012-12-18 NOTE — Patient Instructions (Addendum)
May resume regular activity without restrictions. Follow up as needed. Call with questions or concerns. May return to work 12/20/12

## 2013-05-23 ENCOUNTER — Other Ambulatory Visit: Payer: Self-pay

## 2013-08-25 ENCOUNTER — Encounter (HOSPITAL_COMMUNITY): Payer: Self-pay | Admitting: Emergency Medicine

## 2013-08-25 ENCOUNTER — Emergency Department (HOSPITAL_COMMUNITY): Payer: 59

## 2013-08-25 ENCOUNTER — Inpatient Hospital Stay (HOSPITAL_COMMUNITY)
Admission: EM | Admit: 2013-08-25 | Discharge: 2013-09-04 | DRG: 871 | Disposition: A | Discharge status: Home / Self Care | Payer: 59 | Attending: Internal Medicine | Admitting: Internal Medicine

## 2013-08-25 ENCOUNTER — Other Ambulatory Visit (HOSPITAL_COMMUNITY): Payer: 59

## 2013-08-25 DIAGNOSIS — Z880 Allergy status to penicillin: Secondary | ICD-10-CM

## 2013-08-25 DIAGNOSIS — R7881 Bacteremia: Secondary | ICD-10-CM

## 2013-08-25 DIAGNOSIS — Z833 Family history of diabetes mellitus: Secondary | ICD-10-CM

## 2013-08-25 DIAGNOSIS — M25569 Pain in unspecified knee: Secondary | ICD-10-CM | POA: Diagnosis present

## 2013-08-25 DIAGNOSIS — M25529 Pain in unspecified elbow: Secondary | ICD-10-CM | POA: Diagnosis present

## 2013-08-25 DIAGNOSIS — D649 Anemia, unspecified: Secondary | ICD-10-CM | POA: Diagnosis not present

## 2013-08-25 DIAGNOSIS — B955 Unspecified streptococcus as the cause of diseases classified elsewhere: Secondary | ICD-10-CM | POA: Diagnosis present

## 2013-08-25 DIAGNOSIS — R6520 Severe sepsis without septic shock: Secondary | ICD-10-CM

## 2013-08-25 DIAGNOSIS — E876 Hypokalemia: Secondary | ICD-10-CM | POA: Diagnosis present

## 2013-08-25 DIAGNOSIS — E872 Acidosis, unspecified: Secondary | ICD-10-CM | POA: Diagnosis present

## 2013-08-25 DIAGNOSIS — R7401 Elevation of levels of liver transaminase levels: Secondary | ICD-10-CM

## 2013-08-25 DIAGNOSIS — R740 Nonspecific elevation of levels of transaminase and lactic acid dehydrogenase [LDH]: Secondary | ICD-10-CM

## 2013-08-25 DIAGNOSIS — IMO0002 Reserved for concepts with insufficient information to code with codable children: Secondary | ICD-10-CM

## 2013-08-25 DIAGNOSIS — N390 Urinary tract infection, site not specified: Secondary | ICD-10-CM | POA: Diagnosis present

## 2013-08-25 DIAGNOSIS — J029 Acute pharyngitis, unspecified: Secondary | ICD-10-CM

## 2013-08-25 DIAGNOSIS — E871 Hypo-osmolality and hyponatremia: Secondary | ICD-10-CM | POA: Diagnosis present

## 2013-08-25 DIAGNOSIS — B370 Candidal stomatitis: Secondary | ICD-10-CM | POA: Diagnosis not present

## 2013-08-25 DIAGNOSIS — D72829 Elevated white blood cell count, unspecified: Secondary | ICD-10-CM | POA: Diagnosis present

## 2013-08-25 DIAGNOSIS — E8729 Other acidosis: Secondary | ICD-10-CM | POA: Diagnosis present

## 2013-08-25 DIAGNOSIS — J189 Pneumonia, unspecified organism: Secondary | ICD-10-CM

## 2013-08-25 DIAGNOSIS — Z87891 Personal history of nicotine dependence: Secondary | ICD-10-CM

## 2013-08-25 DIAGNOSIS — R630 Anorexia: Secondary | ICD-10-CM | POA: Diagnosis present

## 2013-08-25 DIAGNOSIS — N179 Acute kidney failure, unspecified: Secondary | ICD-10-CM | POA: Diagnosis present

## 2013-08-25 DIAGNOSIS — I2690 Septic pulmonary embolism without acute cor pulmonale: Secondary | ICD-10-CM | POA: Diagnosis present

## 2013-08-25 DIAGNOSIS — Z8249 Family history of ischemic heart disease and other diseases of the circulatory system: Secondary | ICD-10-CM

## 2013-08-25 DIAGNOSIS — M25521 Pain in right elbow: Secondary | ICD-10-CM | POA: Diagnosis present

## 2013-08-25 DIAGNOSIS — A419 Sepsis, unspecified organism: Principal | ICD-10-CM | POA: Diagnosis present

## 2013-08-25 DIAGNOSIS — J9600 Acute respiratory failure, unspecified whether with hypoxia or hypercapnia: Secondary | ICD-10-CM | POA: Diagnosis present

## 2013-08-25 DIAGNOSIS — J020 Streptococcal pharyngitis: Secondary | ICD-10-CM | POA: Diagnosis present

## 2013-08-25 DIAGNOSIS — M542 Cervicalgia: Secondary | ICD-10-CM

## 2013-08-25 DIAGNOSIS — R000 Tachycardia, unspecified: Secondary | ICD-10-CM

## 2013-08-25 DIAGNOSIS — E860 Dehydration: Secondary | ICD-10-CM | POA: Diagnosis present

## 2013-08-25 DIAGNOSIS — I808 Phlebitis and thrombophlebitis of other sites: Secondary | ICD-10-CM | POA: Diagnosis present

## 2013-08-25 DIAGNOSIS — R6521 Severe sepsis with septic shock: Secondary | ICD-10-CM

## 2013-08-25 LAB — COMPREHENSIVE METABOLIC PANEL
ALT: 119 U/L — ABNORMAL HIGH (ref 0–35)
AST: 127 U/L — AB (ref 0–37)
Albumin: 2.3 g/dL — ABNORMAL LOW (ref 3.5–5.2)
Alkaline Phosphatase: 176 U/L — ABNORMAL HIGH (ref 39–117)
BUN: 43 mg/dL — AB (ref 6–23)
CALCIUM: 8.4 mg/dL (ref 8.4–10.5)
CO2: 17 meq/L — AB (ref 19–32)
CREATININE: 1.98 mg/dL — AB (ref 0.50–1.10)
Chloride: 88 mEq/L — ABNORMAL LOW (ref 96–112)
GFR, EST AFRICAN AMERICAN: 36 mL/min — AB (ref >90)
GFR, EST NON AFRICAN AMERICAN: 31 mL/min — AB (ref >90)
Glucose, Bld: 145 mg/dL — ABNORMAL HIGH (ref 70–99)
Potassium: 2.5 mEq/L — CL (ref 3.7–5.3)
Sodium: 128 mEq/L — ABNORMAL LOW (ref 137–147)
Total Bilirubin: 3 mg/dL — ABNORMAL HIGH (ref 0.3–1.2)
Total Protein: 7.3 g/dL (ref 6.0–8.3)

## 2013-08-25 LAB — CG4 I-STAT (LACTIC ACID): LACTIC ACID, VENOUS: 3.69 mmol/L — AB (ref 0.5–2.2)

## 2013-08-25 LAB — URINALYSIS, ROUTINE W REFLEX MICROSCOPIC
Glucose, UA: NEGATIVE mg/dL
Ketones, ur: 15 mg/dL — AB
Nitrite: NEGATIVE
PROTEIN: 100 mg/dL — AB
Specific Gravity, Urine: 1.022 (ref 1.005–1.030)
UROBILINOGEN UA: 4 mg/dL — AB (ref 0.0–1.0)
pH: 5 (ref 5.0–8.0)

## 2013-08-25 LAB — CBC
HCT: 27.7 % — ABNORMAL LOW (ref 36.0–46.0)
HEMATOCRIT: 30.6 % — AB (ref 36.0–46.0)
Hemoglobin: 11.3 g/dL — ABNORMAL LOW (ref 12.0–15.0)
Hemoglobin: 10.3 g/dL — ABNORMAL LOW (ref 12.0–15.0)
MCH: 28.9 pg (ref 26.0–34.0)
MCH: 29.1 pg (ref 26.0–34.0)
MCHC: 36.9 g/dL — AB (ref 30.0–36.0)
MCHC: 37.2 g/dL — ABNORMAL HIGH (ref 30.0–36.0)
MCV: 78.3 fL (ref 78.0–100.0)
MCV: 77.8 fL — ABNORMAL LOW (ref 78.0–100.0)
PLATELETS: 130 10*3/uL — AB (ref 150–400)
Platelets: 127 10*3/uL — ABNORMAL LOW (ref 150–400)
RBC: 3.91 MIL/uL (ref 3.87–5.11)
RBC: 3.56 MIL/uL — ABNORMAL LOW (ref 3.87–5.11)
RDW: 13.3 % (ref 11.5–15.5)
RDW: 13.4 % (ref 11.5–15.5)
WBC: 27.5 10*3/uL — ABNORMAL HIGH (ref 4.0–10.5)
WBC: 26.8 10*3/uL — ABNORMAL HIGH (ref 4.0–10.5)

## 2013-08-25 LAB — URINE MICROSCOPIC-ADD ON

## 2013-08-25 LAB — PROTIME-INR
INR: 1.25 (ref 0.00–1.49)
Prothrombin Time: 15.4 seconds — ABNORMAL HIGH (ref 11.6–15.2)

## 2013-08-25 LAB — CREATININE, SERUM
CREATININE: 1.44 mg/dL — AB (ref 0.50–1.10)
GFR calc Af Amer: 53 mL/min — ABNORMAL LOW (ref >90)
GFR calc non Af Amer: 46 mL/min — ABNORMAL LOW (ref >90)

## 2013-08-25 LAB — MRSA PCR SCREENING: MRSA BY PCR: NEGATIVE

## 2013-08-25 LAB — RAPID STREP SCREEN (MED CTR MEBANE ONLY): STREPTOCOCCUS, GROUP A SCREEN (DIRECT): NEGATIVE

## 2013-08-25 MED ORDER — ACETAMINOPHEN 650 MG RE SUPP: 650.0000 mg | Freq: Four times a day (QID) | RECTAL | Status: DC | PRN

## 2013-08-25 MED ORDER — LEVOFLOXACIN IN D5W 750 MG/150ML IV SOLN
750.0000 mg | Freq: Once | INTRAVENOUS | Status: AC
  Administered 2013-08-25: 750 mg via INTRAVENOUS
  Filled 2013-08-25: qty 150

## 2013-08-25 MED ORDER — HYDROCODONE-ACETAMINOPHEN 5-325 MG PO TABS
1.0000 | ORAL_TABLET | ORAL | Status: AC | PRN
  Administered 2013-08-25 (×2): 1 via ORAL
  Filled 2013-08-25 (×2): qty 1

## 2013-08-25 MED ORDER — ONDANSETRON HCL 4 MG PO TABS: 4.0000 mg | ORAL_TABLET | Freq: Four times a day (QID) | ORAL | Status: DC | PRN

## 2013-08-25 MED ORDER — HYDROCORTISONE NA SUCCINATE PF 100 MG IJ SOLR
50.0000 mg | Freq: Four times a day (QID) | INTRAMUSCULAR | Status: DC
  Administered 2013-08-25 – 2013-08-26 (×3): 50 mg via INTRAVENOUS
  Filled 2013-08-25 (×7): qty 1

## 2013-08-25 MED ORDER — ACETAMINOPHEN 500 MG PO TABS
1000.0000 mg | ORAL_TABLET | Freq: Once | ORAL | Status: AC
  Administered 2013-08-25: 1000 mg via ORAL
  Filled 2013-08-25: qty 2

## 2013-08-25 MED ORDER — DEXTROSE 5 % IV SOLN
1.0000 g | Freq: Once | INTRAVENOUS | Status: AC
  Administered 2013-08-25: 1 g via INTRAVENOUS
  Filled 2013-08-25: qty 10

## 2013-08-25 MED ORDER — SODIUM CHLORIDE 0.9 % IV BOLUS (SEPSIS): 1000.0000 mL | Freq: Once | INTRAVENOUS | Status: DC

## 2013-08-25 MED ORDER — POLYETHYLENE GLYCOL 3350 17 G PO PACK
17.0000 g | PACK | Freq: Every day | ORAL | Status: DC | PRN
  Filled 2013-08-25: qty 1

## 2013-08-25 MED ORDER — AZITHROMYCIN 500 MG PO TABS
500.0000 mg | ORAL_TABLET | ORAL | Status: DC
  Administered 2013-08-26 – 2013-08-27 (×2): 500 mg via ORAL
  Filled 2013-08-25 (×3): qty 1

## 2013-08-25 MED ORDER — SODIUM CHLORIDE 0.9 % IV SOLN
INTRAVENOUS | Status: DC
  Administered 2013-08-25: 18:00:00 via INTRAVENOUS
  Administered 2013-08-25: 1000 mL via INTRAVENOUS
  Administered 2013-08-26: 10:00:00 via INTRAVENOUS
  Administered 2013-08-26: 1000 mL via INTRAVENOUS
  Administered 2013-08-27: 12:00:00 via INTRAVENOUS
  Administered 2013-08-27 (×2): 1000 mL via INTRAVENOUS
  Administered 2013-08-28 – 2013-09-01 (×2): via INTRAVENOUS

## 2013-08-25 MED ORDER — HYDROCORTISONE SOD SUCCINATE 100 MG PF FOR IT USE: 50.0000 mg | Freq: Four times a day (QID) | INTRAMUSCULAR | Status: DC

## 2013-08-25 MED ORDER — MAGNESIUM OXIDE 400 (241.3 MG) MG PO TABS
400.0000 mg | ORAL_TABLET | Freq: Two times a day (BID) | ORAL | Status: AC
  Administered 2013-08-25 – 2013-08-26 (×2): 400 mg via ORAL
  Filled 2013-08-25 (×2): qty 1

## 2013-08-25 MED ORDER — ONDANSETRON HCL 4 MG/2ML IJ SOLN
4.0000 mg | Freq: Four times a day (QID) | INTRAMUSCULAR | Status: DC | PRN
  Administered 2013-08-25 – 2013-08-26 (×2): 4 mg via INTRAVENOUS
  Filled 2013-08-25 (×2): qty 2

## 2013-08-25 MED ORDER — ACETAMINOPHEN 325 MG PO TABS
650.0000 mg | ORAL_TABLET | Freq: Four times a day (QID) | ORAL | Status: DC | PRN
  Administered 2013-08-25 – 2013-08-31 (×4): 650 mg via ORAL
  Filled 2013-08-25 (×4): qty 2

## 2013-08-25 MED ORDER — POTASSIUM CHLORIDE CRYS ER 20 MEQ PO TBCR
40.0000 meq | EXTENDED_RELEASE_TABLET | Freq: Three times a day (TID) | ORAL | Status: AC
  Administered 2013-08-25 – 2013-08-26 (×3): 40 meq via ORAL
  Filled 2013-08-25 (×3): qty 2

## 2013-08-25 MED ORDER — SODIUM CHLORIDE 0.9 % IV BOLUS (SEPSIS)
1000.0000 mL | Freq: Once | INTRAVENOUS | Status: AC
  Administered 2013-08-25: 1000 mL via INTRAVENOUS

## 2013-08-25 MED ORDER — DEXTROSE 5 % IV SOLN
500.0000 mg | INTRAVENOUS | Status: DC
  Administered 2013-08-25: 500 mg via INTRAVENOUS

## 2013-08-25 MED ORDER — HEPARIN SODIUM (PORCINE) 5000 UNIT/ML IJ SOLN
5000.0000 [IU] | Freq: Three times a day (TID) | INTRAMUSCULAR | Status: DC
  Administered 2013-08-25 – 2013-09-03 (×29): 5000 [IU] via SUBCUTANEOUS
  Filled 2013-08-25 (×34): qty 1

## 2013-08-25 MED ORDER — SODIUM CHLORIDE 0.9 % IJ SOLN
3.0000 mL | Freq: Two times a day (BID) | INTRAMUSCULAR | Status: DC
  Administered 2013-08-27 – 2013-08-30 (×4): 3 mL via INTRAVENOUS

## 2013-08-25 MED ORDER — DEXTROSE 5 % IV SOLN
1.0000 g | INTRAVENOUS | Status: DC
  Administered 2013-08-26 – 2013-08-27 (×2): 1 g via INTRAVENOUS
  Filled 2013-08-25 (×3): qty 10

## 2013-08-25 NOTE — ED Notes (Signed)
Phlebotomy at bedside getting blood cultures.  

## 2013-08-25 NOTE — ED Notes (Signed)
Activated Level 2 Code Sepsis 

## 2013-08-25 NOTE — ED Notes (Signed)
Per micromedex rocephin compatible with levaquin.

## 2013-08-25 NOTE — ED Notes (Signed)
EDP made aware pt bp 91/63, tachypnea present. Reporting activate level 2 code sepsis.

## 2013-08-25 NOTE — ED Notes (Addendum)
Pt was diagnosed with a sinus infection by an UCC last week, started on abx. She is taking the abx but symptoms are not improving. She continues to have fevers, unable to sleep, swollen lymph nodes, and also started having diarrhea. She took tylenol for her fever of 103 around 0900

## 2013-08-25 NOTE — ED Notes (Signed)
Blood cultures have been obtained by phlebotomist, Estill Bamberg. WIll go ahead with antibiotics admin.

## 2013-08-25 NOTE — ED Notes (Signed)
Lactic Acid results reported to DR,Walden. ED-Lab.

## 2013-08-25 NOTE — H&P (Addendum)
[facility] History and Physical  Nicole Alexander TGG:[id number] DOB: September 08, 1975 DOA: 08/25/2013  Referring physician: Dr. Mingo Amber PCP: Antony Blackbird, MD   Chief Complaint: cough and SOB  HPI: Nicole Alexander is a 38 y.o. female  With no significant past medical history that comes in for cough and shortness of breath this started a week prior to admission. She went to urgent care was prescribed the prednisone tapered with no improvement. She relates that her cough and shortness of breath continued to progressively get worse or she can even walk to the bathroom without being short of breath. She also relates fevers that started 3 days prior to admission which improved with Tylenol. This morning she couldn't get out of bed so she decided to come to the ED. She relates no sick contacts productive sputum that has changed color.  In the ED: Basic metabolic panel was done that shows a sodium of 128 potassium is 2.5 a bicarbonate of 17 BUN 43 creatinine 1.9 lactic acid of 3.6, white count 27 hemoglobin of 11.3 a chest x-ray that shows a new infiltrate in the right middle lobe as we are consulted for further evaluation.   Review of Systems:  Constitutional:  No weight loss, night sweats, Fevers, chills, fatigue.  HEENT:  No headaches, Difficulty swallowing,Tooth/dental problems,Sore throat,  No sneezing, itching, ear ache, nasal congestion, post nasal drip,  Cardio-vascular:  No chest pain, Orthopnea, PND, swelling in lower extremities, anasarca, dizziness, palpitations  GI:  No heartburn, indigestion, abdominal pain, nausea, vomiting, diarrhea, change in bowel habits, loss of appetite  Resp:  Skin:  no rash or lesions.  GU:  no dysuria, change in color of urine, no urgency or frequency. No flank pain.  Musculoskeletal:  No joint pain or swelling. No decreased range of motion. No back pain.  Psych:  No change in mood or affect. No depression or anxiety. No memory loss.    History reviewed. No pertinent past medical history. Past Surgical History  Procedure Laterality Date  . Breast surgery      breast reduction  . Cholecystectomy N/A 11/28/2012    Procedure: LAPAROSCOPIC CHOLECYSTECTOMY WITH INTRAOPERATIVE CHOLANGIOGRAM;  Surgeon: Zenovia Jarred, MD;  Location: [place];  Service: General;  Laterality: N/A;   Social History:  reports that she has quit smoking. She has never used smokeless tobacco. She reports that she drinks alcohol. She reports that she does not use illicit drugs.  Allergies  Allergen Reactions  . Penicillins Swelling    "swole up like a balloon"    Family History  Problem Relation Age of Onset  . Heart disease Mother     PTCA/stent  . Hypertension Mother   . Diabetes Mother   . Hyperlipidemia Mother   . Cancer Father     lung  . Hypertension Father   . Diabetes Father   . Hyperlipidemia Father   . Hypertension Brother   . Cancer Maternal Grandfather     bone     Prior to Admission medications   Medication Sig Start Date End Date Taking? Authorizing Provider  clarithromycin (BIAXIN) 250 MG/5ML suspension Take 500 mg by mouth every 12 (twelve) hours. 10 day course started 08/20/13   Yes Historical Provider, MD  prednisoLONE 5 MG TABS tablet Take 5 mg by mouth daily. Taper decreasing by one tablet daily, 6, 5, 4, 3, 2, 1. 08/26/13 will be the last dose.   Yes Historical Provider, MD   Physical Exam: Filed Vitals:   08/25/13  1345  BP: 108/74  Pulse: 114  Temp:   Resp: 42    BP 108/74  Pulse 114  Temp(Src) 99 F (37.2 C) (Oral)  Resp 42  Ht 5\' 9"  (1.753 m)  Wt 98.431 kg (217 lb)  BMI 32.03 kg/m2  SpO2 100%  LMP 08/18/2013  General:  Appears calm and comfortable tired appearing Eyes: PERRL, normal lids, irises & conjunctiva ENT: grossly normal hearing, lips & tongue Neck: no LAD, masses or thyromegaly Cardiovascular: RRR, no m/r/g. No LE edema. Telemetry: SR, no arrhythmias  Respiratory: Good air movement with  crackles on the right Abdomen: soft, ntnd Skin: no rash or induration seen on limited exam Musculoskeletal: grossly normal tone BUE/BLE Psychiatric: grossly normal mood and affect, speech fluent and appropriate Neurologic: grossly non-focal.          Labs on Admission:  Basic Metabolic Panel:  Recent Labs Lab 08/25/13 1140  NA 128*  K 2.5*  CL 88*  CO2 17*  GLUCOSE 145*  BUN 43*  CREATININE 1.98*  CALCIUM 8.4   Liver Function Tests:  Recent Labs Lab 08/25/13 1140  AST 127*  ALT 119*  ALKPHOS 176*  BILITOT 3.0*  PROT 7.3  ALBUMIN 2.3*   No results found for this basename: LIPASE, AMYLASE,  in the last 168 hours No results found for this basename: AMMONIA,  in the last 168 hours CBC:  Recent Labs Lab 08/25/13 1140  WBC 27.5*  HGB 11.3*  HCT 30.6*  MCV 78.3  PLT 127*   Cardiac Enzymes: No results found for this basename: CKTOTAL, CKMB, CKMBINDEX, TROPONINI,  in the last 168 hours  BNP (last 3 results) No results found for this basename: PROBNP,  in the last 8760 hours CBG: No results found for this basename: GLUCAP,  in the last 168 hours  Radiological Exams on Admission: Dg Chest 2 View (if Patient Has Fever And/or Copd)  08/25/2013   CLINICAL DATA:  Chest pain and shortness of breath  EXAM: CHEST  2 VIEW  COMPARISON:  11/24/2012  FINDINGS: Cardiac shadow is within normal limits. The lungs are well aerated but a right middle lobe pneumonia is seen laterally. No other focal abnormality is seen. The  IMPRESSION: Right middle lobe pneumonia.   Electronically Signed   By: Inez Catalina M.D.   On: 08/25/2013 12:02    EKG: Independently reviewed.   Assessment/Plan Septic shock due to CAP (community acquired pneumonia)/ AKI (acute kidney injury)/ High anion gap metabolic acidosis/Leukocytosis: - Blood cultures check sputum cultures, start IV Rocephin and azithromycin. - Admit to step down monitor strict I.'s and O's was given 2 boluses of normal saline in the  emergency room, will give an additional bolus of normal saline and continue to 125 an hour. - She was previously on prednisone taper so core low-level we'll not be reliable we'll treat empirically with Solu-Cortef IV every 6 hours for next 2 days. - Check a PT and INR, Tylenol for fevers. - Zofran for nausea.   - Check a basic metabolic panel in the morning. - Check an HIV and urine Legionella.   Hyponatremia hypokalemia: - Is most likely due to decreased intravascular volume we'll give her IV fluids and check basic metabolic panel tomorrow morning.  - Repeat potassium replete magnesium checked tomorrow morning   Code Status: full Family Communication: husband Disposition Plan: inpatient  Time spent: 76 minutes  Charlynne Cousins [facility] Pager [phone]

## 2013-08-25 NOTE — ED Provider Notes (Signed)
CSN: [id number]     Arrival date & time 08/25/13  1039 History   First MD Initiated Contact with Patient 08/25/13 1108     Chief Complaint  Patient presents with  . URI   (Consider location/radiation/quality/duration/timing/severity/associated sxs/prior Treatment) Patient is a 38 y.o. female presenting with URI. The history is provided by the patient.  URI Presenting symptoms: congestion, fever (up to 103) and sore throat   Presenting symptoms: no cough and no rhinorrhea   Severity:  Severe Onset quality:  Gradual Timing:  Constant Progression:  Improving Chronicity:  New Relieved by:  Nothing Worsened by:  Nothing tried Ineffective treatments:  None tried Associated symptoms: headaches, neck pain (L sided), sinus pain and swollen glands (L submandibular)     History reviewed. No pertinent past medical history. Past Surgical History  Procedure Laterality Date  . Breast surgery      breast reduction  . Cholecystectomy N/A 11/28/2012    Procedure: LAPAROSCOPIC CHOLECYSTECTOMY WITH INTRAOPERATIVE CHOLANGIOGRAM;  Surgeon: Zenovia Jarred, MD;  Location: [facility];  Service: General;  Laterality: N/A;   Family History  Problem Relation Age of Onset  . Heart disease Mother     PTCA/stent  . Hypertension Mother   . Diabetes Mother   . Hyperlipidemia Mother   . Cancer Father     lung  . Hypertension Father   . Diabetes Father   . Hyperlipidemia Father   . Hypertension Brother   . Cancer Maternal Grandfather     bone   History  Substance Use Topics  . Smoking status: Never Smoker   . Smokeless tobacco: Never Used  . Alcohol Use: Yes     Comment: occasional   OB History   Grav Para Term Preterm Abortions TAB SAB Ect Mult Living                 Review of Systems  Constitutional: Positive for fever (up to 103).  HENT: Positive for congestion and sore throat. Negative for rhinorrhea and sinus pressure.   Respiratory: Negative for cough and shortness of breath.    Cardiovascular: Negative for chest pain and leg swelling.  Musculoskeletal: Positive for neck pain (L sided).  Neurological: Positive for headaches.  All other systems reviewed and are negative.    Allergies  Penicillins  Home Medications   Current Outpatient Rx  Name  Route  Sig  Dispense  Refill  . clarithromycin (BIAXIN) 250 MG/5ML suspension   Oral   Take 500 mg by mouth every 12 (twelve) hours. 10 day course started 08/20/13         . prednisoLONE 5 MG TABS tablet   Oral   Take 5 mg by mouth daily. Taper decreasing by one tablet daily, 6, 5, 4, 3, 2, 1. 08/26/13 will be the last dose.          BP 91/63  Pulse 123  Temp(Src) 99 F (37.2 C) (Oral)  Resp 30  Ht 5\' 9"  (1.753 m)  Wt 217 lb (98.431 kg)  BMI 32.03 kg/m2  SpO2 99%  LMP 08/18/2013 Physical Exam  Nursing note and vitals reviewed. Constitutional: She is oriented to person, place, and time. She appears well-developed and well-nourished. No distress.  HENT:  Head: Normocephalic and atraumatic.  Right Ear: Tympanic membrane and external ear normal.  Left Ear: Tympanic membrane normal.  Mouth/Throat: Posterior oropharyngeal edema (mild) present. No oropharyngeal exudate or posterior oropharyngeal erythema.  Eyes: EOM are normal. Pupils are equal, round, and reactive to  light.  Neck: Normal range of motion. Neck supple.  Cardiovascular: Normal rate and regular rhythm.  Exam reveals no friction rub.   No murmur heard. Pulmonary/Chest: Effort normal and breath sounds normal. No respiratory distress. She has no wheezes. She has no rales.  Abdominal: Soft. She exhibits no distension. There is no tenderness. There is no rebound.  Musculoskeletal: Normal range of motion. She exhibits no edema.  Lymphadenopathy:    She has cervical adenopathy (L sided, tender, submandibular).  Neurological: She is alert and oriented to person, place, and time.  Skin: No rash noted. She is not diaphoretic.    ED Course   Procedures (including critical care time) Labs Review Labs Reviewed  RAPID STREP SCREEN  CULTURE, BLOOD (ROUTINE X 2)  CULTURE, BLOOD (ROUTINE X 2)  CBC  URINALYSIS, ROUTINE W REFLEX MICROSCOPIC  COMPREHENSIVE METABOLIC PANEL   Imaging Review Dg Chest 2 View (if Patient Has Fever And/or Copd)  08/25/2013   CLINICAL DATA:  Chest pain and shortness of breath  EXAM: CHEST  2 VIEW  COMPARISON:  11/24/2012  FINDINGS: Cardiac shadow is within normal limits. The lungs are well aerated but a right middle lobe pneumonia is seen laterally. No other focal abnormality is seen. The  IMPRESSION: Right middle lobe pneumonia.   Electronically Signed   By: Inez Catalina M.D.   On: 08/25/2013 12:02      Date: 08/25/2013  Rate: 133  Rhythm: sinus tachycardia  QRS Axis: normal  Intervals: normal  ST/T Wave abnormalities: nonspecific T wave changes  Conduction Disutrbances:none  Narrative Interpretation:   Old EKG Reviewed: changes noted   CRITICAL CARE Performed by: Osvaldo Shipper   Total critical care time: 30 minutes  Critical care time was exclusive of separately billable procedures and treating other patients.  Critical care was necessary to treat or prevent imminent or life-threatening deterioration.  Critical care was time spent personally by me on the following activities: development of treatment plan with patient and/or surrogate as well as nursing, discussions with consultants, evaluation of patient's response to treatment, examination of patient, obtaining history from patient or surrogate, ordering and performing treatments and interventions, ordering and review of laboratory studies, ordering and review of radiographic studies, pulse oximetry and re-evaluation of patient's condition.  MDM   1. CAP (community acquired pneumonia)   2. Tachycardia   3. Sepsis    66F here with tachycardia, general malaise. She has been feeling bad for the past 5-6 days. This includes fever,  sore throat, sinus congestion. Seen at urgent care she might have a sinus infection. She was given clarithromycin by the urgent care doctor a few days after her initial presentation. She's had no relief with symptomatic treatment. Today, she is complaining of sinus pain, sore throat. She did have a sore throat causing swallowing difficulties for a few days,  that has improved since being placed on prednisone by the Urgent Care doctor also. Patient here with sinus tachycardia in the 140s. Normal blood pressures initially. No hypoxia. She is not tachypneic on my initial exam.  Initial exam clear lungs, mild trismus with a mildly sore throat. She has no evidence of PTA. Her TMs are clear. She is tender left-sided lymphadenopathy of her submandibular region. She has normal belly exam. An hour and fifteen minutes after my initial exam, there is a family that her blood pressures are now softer, 90s over 60s. Heart rate is improved to the 120s, with lower blood pressure and concerns to be  becoming septic. Level II sepsis initiated. Blood cultures drawn. Unknown source of sepsis at this time. We'll CT her neck to look for possible infection there. I reviewed her CXR, which shows Right middle lobe pneumonia. Will start CAP coverage with levaquin.  Will hold on CT scan due to renal insufficiency.     Osvaldo Shipper, MD 08/25/13 [phone]

## 2013-08-25 NOTE — ED Notes (Signed)
RN not able to establish a second IV. Floor RN made aware. Pt IV flowing well, antibiotics and IV fluid going at this time.

## 2013-08-26 LAB — BLOOD GAS, ARTERIAL
ACID-BASE DEFICIT: 6.1 mmol/L — AB (ref 0.0–2.0)
Bicarbonate: 17.5 mEq/L — ABNORMAL LOW (ref 20.0–24.0)
DRAWN BY: 31101
FIO2: 100 %
O2 SAT: 99.8 %
PATIENT TEMPERATURE: 98.6
TCO2: 18.3 mmol/L (ref 0–100)
pCO2 arterial: 26.9 mmHg — ABNORMAL LOW (ref 35.0–45.0)
pH, Arterial: 7.428 (ref 7.350–7.450)
pO2, Arterial: 348 mmHg — ABNORMAL HIGH (ref 80.0–100.0)

## 2013-08-26 LAB — COMPREHENSIVE METABOLIC PANEL
ALK PHOS: 159 U/L — AB (ref 39–117)
ALT: 100 U/L — ABNORMAL HIGH (ref 0–35)
AST: 71 U/L — ABNORMAL HIGH (ref 0–37)
Albumin: 2 g/dL — ABNORMAL LOW (ref 3.5–5.2)
BUN: 32 mg/dL — ABNORMAL HIGH (ref 6–23)
CALCIUM: 8.1 mg/dL — AB (ref 8.4–10.5)
CO2: 18 meq/L — AB (ref 19–32)
Chloride: 102 mEq/L (ref 96–112)
Creatinine, Ser: 1.14 mg/dL — ABNORMAL HIGH (ref 0.50–1.10)
GFR, EST AFRICAN AMERICAN: 70 mL/min — AB (ref >90)
GFR, EST NON AFRICAN AMERICAN: 61 mL/min — AB (ref >90)
GLUCOSE: 134 mg/dL — AB (ref 70–99)
POTASSIUM: 3.7 meq/L (ref 3.7–5.3)
Sodium: 136 mEq/L — ABNORMAL LOW (ref 137–147)
Total Bilirubin: 2.5 mg/dL — ABNORMAL HIGH (ref 0.3–1.2)
Total Protein: 7.1 g/dL (ref 6.0–8.3)

## 2013-08-26 LAB — CBC
HCT: 28.1 % — ABNORMAL LOW (ref 36.0–46.0)
HEMOGLOBIN: 10.3 g/dL — AB (ref 12.0–15.0)
MCH: 28.7 pg (ref 26.0–34.0)
MCHC: 36.7 g/dL — AB (ref 30.0–36.0)
MCV: 78.3 fL (ref 78.0–100.0)
Platelets: 136 10*3/uL — ABNORMAL LOW (ref 150–400)
RBC: 3.59 MIL/uL — AB (ref 3.87–5.11)
RDW: 13.6 % (ref 11.5–15.5)
WBC: 35.7 10*3/uL — ABNORMAL HIGH (ref 4.0–10.5)

## 2013-08-26 LAB — URINE CULTURE: Colony Count: >=100000

## 2013-08-26 LAB — INFLUENZA PANEL BY PCR (TYPE A & B)
H1N1 flu by pcr: NOT DETECTED
INFLAPCR: NEGATIVE
Influenza B By PCR: NEGATIVE

## 2013-08-26 LAB — HIV ANTIBODY (ROUTINE TESTING W REFLEX): HIV: NONREACTIVE

## 2013-08-26 MED ORDER — MORPHINE SULFATE 2 MG/ML IJ SOLN
INTRAMUSCULAR | Status: AC
  Administered 2013-08-26: 2 mg via INTRAVENOUS
  Filled 2013-08-26: qty 1

## 2013-08-26 MED ORDER — MORPHINE SULFATE 2 MG/ML IJ SOLN
2.0000 mg | Freq: Once | INTRAMUSCULAR | Status: AC
  Administered 2013-08-26: 2 mg via INTRAVENOUS

## 2013-08-26 NOTE — Progress Notes (Signed)
Utilization review completed.  

## 2013-08-26 NOTE — Progress Notes (Signed)
[facility]  Nicole Alexander TIR:[id number] DOB: 1976-05-09 DOA: 08/25/2013 PCP: Antony Blackbird, MD  Brief narrative: 38 year old female patient with no significant past medical history. Endorsed cough and shortness of breath for one week. Was initially treated at urgent care and was prescribed a prednisone taper but did not see any improvement in her symptoms. Her symptoms actually worsened to the point she could not even walk to the bathroom without significant dyspnea. She also noticed fevers 3 days prior to her presentation to [facility]. Upon arrival to the emergency department patient was not hypoxic but had significant tachypnea and was tachycardic with a heart rate of 142. She was given fluid challenges. In addition her sodium was 128 with potassium of 2.5 and a bicarbonate of 17. Her BUN was 43 and creatinine was 1.9 with a lactic acid of 3.6 and white count of 27,000. Chest x-ray revealed infiltrate right middle lung.  Assessment/Plan:  Sepsis -Sepsis physiology is improving but has not yet resolved (remains tachy, WBC markedly elevated) -Continue supportive care -Not hypotensive so discontinue stress dose steroids  Community acquired pneumonia -Currently without hypoxia but does endorse pleuritic-type chest discomfort -Continue antibiotics and supportive care -Rule out influenza as an initial precipitating event  Acute kidney injury -Improved after hydration so continue to follow  Dehydration with hyponatremia -Sodium increasing with rehydration -Since remains tachycardic with increased IV fluids to 150 cc per hour  Lactic acidosis  -Secondary to low perfusion from rsepsis - resolving  DVT prophylaxis: Subcutaneous heparin Code Status: Full Family Communication: No family at bedside Disposition Plan/Expected LOS: Stepdown  Consultants:  None  Procedures: None  Antibiotics: Zithromax 2/9 >>> Rocephin 2/9  >>>  HPI/Subjective: Alert and sitting up in chair. Endorses an improvement in respiratory status. Still anorexia. Having some pleuritic chest discomfort with inspiration.  Objective: Blood pressure 107/89, pulse 108, temperature 98.5 F (36.9 C), temperature source Oral, resp. rate 31, height 5\' 9"  (1.753 m), weight 217 lb 2.5 oz (98.5 kg), last menstrual period 08/18/2013, SpO2 95.00%.  Intake/Output Summary (Last 24 hours) at 08/26/13 1335 Last data filed at 08/26/13 1327  Gross per 24 hour  Intake 3602.92 ml  Output   2278 ml  Net 1324.92 ml   Exam: General: No acute respiratory distress Lungs: Right basilar and mid field crackles with no wheeze Cardiovascular: Tachycardic but regular with no appreciable rub or murmur - no peripheral edema Abdomen: Nontender, nondistended, soft, bowel sounds positive, no rebound, no ascites, no appreciable mass Musculoskeletal: No significant cyanosis, clubbing of bilateral lower extremities Neurological: Alert and oriented x 3, moves all extremities x 4 without focal neurological deficits, CN 2-12 intact  Scheduled Meds:  Scheduled Meds: . azithromycin  500 mg Oral Q24H  . cefTRIAXone (ROCEPHIN)  IV  1 g Intravenous Q24H  . heparin  5,000 Units Subcutaneous Q8H  . sodium chloride  3 mL Intravenous Q12H   Continuous Infusions: . sodium chloride 150 mL/hr at 08/26/13 0941    Data Reviewed: Basic Metabolic Panel:  Recent Labs Lab 08/25/13 1140 08/25/13 1725 08/26/13 0315  NA 128*  --  136*  K 2.5*  --  3.7  CL 88*  --  102  CO2 17*  --  18*  GLUCOSE 145*  --  134*  BUN 43*  --  32*  CREATININE 1.98* 1.44* 1.14*  CALCIUM 8.4  --  8.1*   Liver Function Tests:  Recent Labs Lab 08/25/13 1140  08/26/13 0315  AST 127* 71*  ALT 119* 100*  ALKPHOS 176* 159*  BILITOT 3.0* 2.5*  PROT 7.3 7.1  ALBUMIN 2.3* 2.0*   No results found for this basename: LIPASE, AMYLASE,  in the last 168 hours No results found for this basename:  AMMONIA,  in the last 168 hours  CBC:  Recent Labs Lab 08/25/13 1140 08/25/13 1725 08/26/13 0315  WBC 27.5* 26.8* 35.7*  HGB 11.3* 10.3* 10.3*  HCT 30.6* 27.7* 28.1*  MCV 78.3 77.8* 78.3  PLT 127* 130* 136*    Recent Results (from the past 240 hour(s))  RAPID STREP SCREEN     Status: None   Collection Time    08/25/13 12:28 PM      Result Value Range Status   Streptococcus, Group A Screen (Direct) NEGATIVE  NEGATIVE Final   Comment: (NOTE)     A Rapid Antigen test may result negative if the antigen level in the     sample is below the detection level of this test. The FDA has not     cleared this test as a stand-alone test therefore the rapid antigen     negative result has reflexed to a Group A Strep culture.  MRSA PCR SCREENING     Status: None   Collection Time    08/25/13  3:52 PM      Result Value Range Status   MRSA by PCR NEGATIVE  NEGATIVE Final   Comment:            The GeneXpert MRSA Assay (FDA     approved for NASAL specimens     only), is one component of a     comprehensive MRSA colonization     surveillance program. It is not     intended to diagnose MRSA     infection nor to guide or     monitor treatment for     MRSA infections.  CULTURE, BLOOD (ROUTINE X 2)     Status: None   Collection Time    08/25/13  4:04 PM      Result Value Range Status   Specimen Description BLOOD RIGHT HAND   Final   Special Requests BOTTLES DRAWN AEROBIC AND ANAEROBIC 3CC   Final   Culture  Setup Time     Final   Value: 08/25/2013 22:01     Performed at [facility]   Culture     Final   Value:        BLOOD CULTURE RECEIVED NO GROWTH TO DATE CULTURE WILL BE HELD FOR 5 DAYS BEFORE ISSUING A FINAL NEGATIVE REPORT     Performed at [facility]   Report Status PENDING   Incomplete  CULTURE, BLOOD (ROUTINE X 2)     Status: None   Collection Time    08/25/13  4:09 PM      Result Value Range Status   Specimen Description BLOOD LEFT HAND   Final   Special  Requests BOTTLES DRAWN AEROBIC ONLY 4CC   Final   Culture  Setup Time     Final   Value: 08/25/2013 21:59     Performed at [facility]   Culture     Final   Value:        BLOOD CULTURE RECEIVED NO GROWTH TO DATE CULTURE WILL BE HELD FOR 5 DAYS BEFORE ISSUING A FINAL NEGATIVE REPORT     Performed at [facility]   Report Status PENDING  Incomplete     Studies:  Recent x-ray studies have been reviewed in detail by the Attending Physician  Time spent : 35 minutes     Erin Hearing, ANP [facility] Office  [phone] Pager [phone]  **If unable to reach the above provider after paging please contact the [place] @ [phone]  On-Call/Text Page:      [email or web address]      password [email or web address]  If 7PM-7AM, please contact night-coverage [email or web address] Password [email or web address] 08/26/2013, 1:35 PM   LOS: 1 day   I have personally examined this patient and reviewed the entire database. I have reviewed the above note, made any necessary editorial changes, and agree with its content.  Cherene Altes, MD [facility]

## 2013-08-27 ENCOUNTER — Inpatient Hospital Stay (HOSPITAL_COMMUNITY): Payer: 59

## 2013-08-27 ENCOUNTER — Encounter (HOSPITAL_COMMUNITY): Payer: Self-pay | Admitting: *Deleted

## 2013-08-27 DIAGNOSIS — M542 Cervicalgia: Secondary | ICD-10-CM | POA: Diagnosis present

## 2013-08-27 DIAGNOSIS — I2690 Septic pulmonary embolism without acute cor pulmonale: Secondary | ICD-10-CM

## 2013-08-27 DIAGNOSIS — R7881 Bacteremia: Secondary | ICD-10-CM

## 2013-08-27 DIAGNOSIS — M25521 Pain in right elbow: Secondary | ICD-10-CM | POA: Diagnosis present

## 2013-08-27 DIAGNOSIS — B955 Unspecified streptococcus as the cause of diseases classified elsewhere: Secondary | ICD-10-CM | POA: Diagnosis present

## 2013-08-27 DIAGNOSIS — N390 Urinary tract infection, site not specified: Secondary | ICD-10-CM | POA: Diagnosis present

## 2013-08-27 DIAGNOSIS — I808 Phlebitis and thrombophlebitis of other sites: Secondary | ICD-10-CM

## 2013-08-27 DIAGNOSIS — J039 Acute tonsillitis, unspecified: Secondary | ICD-10-CM

## 2013-08-27 LAB — BASIC METABOLIC PANEL
BUN: 20 mg/dL (ref 6–23)
CALCIUM: 7.7 mg/dL — AB (ref 8.4–10.5)
CHLORIDE: 103 meq/L (ref 96–112)
CO2: 17 mEq/L — ABNORMAL LOW (ref 19–32)
CREATININE: 0.85 mg/dL (ref 0.50–1.10)
GFR calc Af Amer: >90 mL/min (ref >90)
GFR, EST NON AFRICAN AMERICAN: 86 mL/min — AB (ref >90)
Glucose, Bld: 116 mg/dL — ABNORMAL HIGH (ref 70–99)
Potassium: 3.6 mEq/L — ABNORMAL LOW (ref 3.7–5.3)
Sodium: 133 mEq/L — ABNORMAL LOW (ref 137–147)

## 2013-08-27 LAB — CULTURE, GROUP A STREP

## 2013-08-27 LAB — CBC
HEMATOCRIT: 26 % — AB (ref 36.0–46.0)
Hemoglobin: 9.6 g/dL — ABNORMAL LOW (ref 12.0–15.0)
MCH: 28.8 pg (ref 26.0–34.0)
MCHC: 36.9 g/dL — AB (ref 30.0–36.0)
MCV: 78.1 fL (ref 78.0–100.0)
PLATELETS: 169 10*3/uL (ref 150–400)
RBC: 3.33 MIL/uL — ABNORMAL LOW (ref 3.87–5.11)
RDW: 13.8 % (ref 11.5–15.5)
WBC: 35.1 10*3/uL — AB (ref 4.0–10.5)

## 2013-08-27 LAB — LEGIONELLA ANTIGEN, URINE: LEGIONELLA ANTIGEN, URINE: NEGATIVE

## 2013-08-27 LAB — STREP PNEUMONIAE URINARY ANTIGEN: STREP PNEUMO URINARY ANTIGEN: NEGATIVE

## 2013-08-27 MED ORDER — GUAIFENESIN ER 600 MG PO TB12
1200.0000 mg | ORAL_TABLET | Freq: Two times a day (BID) | ORAL | Status: DC
  Administered 2013-08-27 – 2013-09-04 (×18): 1200 mg via ORAL
  Filled 2013-08-27 (×20): qty 2

## 2013-08-27 MED ORDER — LORAZEPAM 0.5 MG PO TABS
0.5000 mg | ORAL_TABLET | Freq: Four times a day (QID) | ORAL | Status: DC | PRN
  Administered 2013-08-28: 0.5 mg via ORAL
  Filled 2013-08-27 (×2): qty 1

## 2013-08-27 MED ORDER — IOHEXOL 300 MG/ML  SOLN
75.0000 mL | Freq: Once | INTRAMUSCULAR | Status: AC | PRN
  Administered 2013-08-27: 75 mL via INTRAVENOUS

## 2013-08-27 MED ORDER — LEVALBUTEROL HCL 0.63 MG/3ML IN NEBU
0.6300 mg | INHALATION_SOLUTION | Freq: Four times a day (QID) | RESPIRATORY_TRACT | Status: DC | PRN
  Administered 2013-08-27 – 2013-08-30 (×5): 0.63 mg via RESPIRATORY_TRACT
  Filled 2013-08-27 (×5): qty 3

## 2013-08-27 MED ORDER — MORPHINE SULFATE 2 MG/ML IJ SOLN
1.0000 mg | INTRAMUSCULAR | Status: DC | PRN
  Administered 2013-08-27 – 2013-08-28 (×4): 1 mg via INTRAVENOUS
  Filled 2013-08-27 (×5): qty 1

## 2013-08-27 MED ORDER — DEXTROSE 5 % IV SOLN
1.0000 g | INTRAVENOUS | Status: DC
  Administered 2013-08-27 – 2013-09-01 (×6): 1 g via INTRAVENOUS
  Filled 2013-08-27 (×8): qty 10

## 2013-08-27 MED ORDER — CLINDAMYCIN PHOSPHATE 900 MG/50ML IV SOLN
900.0000 mg | Freq: Three times a day (TID) | INTRAVENOUS | Status: DC
  Administered 2013-08-27 – 2013-08-31 (×12): 900 mg via INTRAVENOUS
  Filled 2013-08-27 (×16): qty 50

## 2013-08-27 NOTE — Progress Notes (Signed)
Pt has increased resperstions up to mid 50's and HR sustained in the 120's and high of 130's sinus tact. Pt stated that she felt short of breath but still better than when she was admitted. Pt is breathless when talking. Lung sounds diminished though out and pt is taking short shallow breaths. Tylene Fantasia NP notified and assessed pt at bedside. Orders placed and followed. Will continue to monitor pt.

## 2013-08-27 NOTE — Progress Notes (Signed)
Positive blood culture result paged to K. Baltazar Najjar. Will continue to monitor pt.

## 2013-08-27 NOTE — Progress Notes (Signed)
[facility]  Shakhia Gramajo Ross-Clayton YSA:[id number] DOB: 1976-07-04 DOA: 08/25/2013 PCP: Antony Blackbird, MD  Brief narrative: 38 year old female patient with no significant past medical history. Endorsed cough and shortness of breath for one week. Was initially treated at urgent care and was prescribed a prednisone taper but did not see any improvement in her symptoms. Her symptoms actually worsened to the point she could not even walk to the bathroom without significant dyspnea. She also noticed fevers 3 days prior to her presentation to [facility]. Upon arrival to the emergency department patient was not hypoxic but had significant tachypnea and was tachycardic with a heart rate of 142. She was given fluid challenges. In addition her sodium was 128 with potassium of 2.5 and a bicarbonate of 17. Her BUN was 43 and creatinine was 1.9 with a lactic acid of 3.6 and white count of 27,000. Chest x-ray revealed infiltrate right middle lung.  Assessment/Plan:  Sepsis -Sepsis physiology is improving but has not yet resolved (remains tachy, WBC markedly elevated) -Continue supportive care -Not hypotensive so discontinue stress dose steroids  Community acquired pneumonia -Currently without hypoxia -had episode with tachypnea overnight that responded to nebs and morphine- ?? a degree on anxiety- CXR today with stable slightly more apparent right infiltrate -Continue antibiotics and supportive care -Influenza negative -check urinary strep and legionella -WBC remains very high despite dc of steroids 2/9  GPC bacteremia -consult ID -May need TTE to r/o endocarditis  Left neck pain/swelling -? Abscess so check CT neck -Radiologist called attending MD to say pt had what appears to be septic thrombophlebitis (non occlusive) of the IJ in setting of acute pharyngitis/tonsilitis with multiple septic pulmonary embolization (Lemierre syndrome)-blood cx's with GPCs in  chains -consult ID -Uptodate rec anticoagulation only in setting of extensive phlebitis  ? UTI -> 100,000 colonies multiple morphotypes  Acute kidney injury -Improved after hydration so continue to follow  Dehydration with hyponatremia -Sodium increasing with rehydration -Since remains tachycardic cont IV fluids at 150 cc per hour  Lactic acidosis  -Secondary to low perfusion from sepsis - resolving  Right elbow pain -reproducible with elbow flexed and raised above head c/w impingement syndrome- follow  DVT prophylaxis: Subcutaneous heparin Code Status: Full Family Communication: No family at bedside Disposition Plan/Expected LOS: SDU  Consultants:  ID  Procedures: None  Antibiotics: Zithromax 2/9 >>> Rocephin 2/9 >>>  HPI/Subjective: Alert and sitting up in chair. Endorsed pain in left jaw and neck area and elbow pain with activity/arm above head  Objective: Blood pressure 127/84, pulse 119, temperature 99 F (37.2 C), temperature source Oral, resp. rate 37, height 5\' 9"  (1.753 m), weight 217 lb 2.5 oz (98.5 kg), last menstrual period 08/18/2013, SpO2 97.00%.  Intake/Output Summary (Last 24 hours) at 08/27/13 1459 Last data filed at 08/27/13 1300  Gross per 24 hour  Intake   3920 ml  Output   1325 ml  Net   2595 ml   Exam: General: No acute respiratory distress ENT: exquisitely tender left neck esp at jaw line angle just below pinna-too tender to adequately assess for adenopathy Lungs: Right basilar and mid field crackles with no wheeze Cardiovascular: Tachycardic but regular with no appreciable rub or murmur - no peripheral edema Abdomen: Nontender, nondistended, soft, bowel sounds positive, no rebound, no ascites, no appreciable mass Musculoskeletal: No significant cyanosis, clubbing of bilateral lower extremities-right elbow without erythema or effusion- sharp severe pain reproducible with arm flexed and  raised above head Neurological: Alert and oriented x  3, moves all extremities x 4 without focal neurological deficits, CN 2-12 intact  Scheduled Meds:  Scheduled Meds: . azithromycin  500 mg Oral Q24H  . cefTRIAXone (ROCEPHIN)  IV  1 g Intravenous Q24H  . guaiFENesin  1,200 mg Oral BID  . heparin  5,000 Units Subcutaneous Q8H  . sodium chloride  3 mL Intravenous Q12H   Continuous Infusions: . sodium chloride 150 mL/hr at 08/27/13 1219    Data Reviewed: Basic Metabolic Panel:  Recent Labs Lab 08/25/13 1140 08/25/13 1725 08/26/13 0315 08/27/13 0240  NA 128*  --  136* 133*  K 2.5*  --  3.7 3.6*  CL 88*  --  102 103  CO2 17*  --  18* 17*  GLUCOSE 145*  --  134* 116*  BUN 43*  --  32* 20  CREATININE 1.98* 1.44* 1.14* 0.85  CALCIUM 8.4  --  8.1* 7.7*   Liver Function Tests:  Recent Labs Lab 08/25/13 1140 08/26/13 0315  AST 127* 71*  ALT 119* 100*  ALKPHOS 176* 159*  BILITOT 3.0* 2.5*  PROT 7.3 7.1  ALBUMIN 2.3* 2.0*   No results found for this basename: LIPASE, AMYLASE,  in the last 168 hours No results found for this basename: AMMONIA,  in the last 168 hours  CBC:  Recent Labs Lab 08/25/13 1140 08/25/13 1725 08/26/13 0315 08/27/13 0240  WBC 27.5* 26.8* 35.7* 35.1*  HGB 11.3* 10.3* 10.3* 9.6*  HCT 30.6* 27.7* 28.1* 26.0*  MCV 78.3 77.8* 78.3 78.1  PLT 127* 130* 136* 169    Recent Results (from the past 240 hour(s))  RAPID STREP SCREEN     Status: None   Collection Time    08/25/13 12:28 PM      Result Value Range Status   Streptococcus, Group A Screen (Direct) NEGATIVE  NEGATIVE Final   Comment: (NOTE)     A Rapid Antigen test may result negative if the antigen level in the     sample is below the detection level of this test. The FDA has not     cleared this test as a stand-alone test therefore the rapid antigen     negative result has reflexed to a Group A Strep culture.  CULTURE, GROUP A STREP     Status: None   Collection Time    08/25/13 12:28 PM      Result Value Range Status   Specimen  Description THROAT   Final   Special Requests NONE   Final   Culture     Final   Value: No Beta Hemolytic Streptococci Isolated     Performed at [facility]   Report Status 08/27/2013 FINAL   Final  URINE CULTURE     Status: None   Collection Time    08/25/13 12:28 PM      Result Value Range Status   Specimen Description URINE, RANDOM   Final   Special Requests NONE   Final   Culture  Setup Time     Final   Value: 08/25/2013 18:01     Performed at [facility]     Final   Value: >=100,000 COLONIES/ML     Performed at [facility]   Culture     Final   Value: Multiple bacterial morphotypes present, none predominant. Suggest appropriate recollection if clinically indicated.     Performed at [facility]  Report Status 08/26/2013 FINAL   Final  MRSA PCR SCREENING     Status: None   Collection Time    08/25/13  3:52 PM      Result Value Range Status   MRSA by PCR NEGATIVE  NEGATIVE Final   Comment:            The GeneXpert MRSA Assay (FDA     approved for NASAL specimens     only), is one component of a     comprehensive MRSA colonization     surveillance program. It is not     intended to diagnose MRSA     infection nor to guide or     monitor treatment for     MRSA infections.  CULTURE, BLOOD (ROUTINE X 2)     Status: None   Collection Time    08/25/13  4:04 PM      Result Value Range Status   Specimen Description BLOOD RIGHT HAND   Final   Special Requests BOTTLES DRAWN AEROBIC AND ANAEROBIC 3CC   Final   Culture  Setup Time     Final   Value: 08/25/2013 22:01     Performed at [facility]   Culture     Final   Value: Leslie IN CHAINS     Note: Gram Stain Report Called to,Read Back By and Verified With: HEATHER RICHARD ON 08/26/2013 AT 9:48P BY WILEJ     Performed at [facility]   Report Status PENDING   Incomplete  CULTURE, BLOOD (ROUTINE X 2)     Status: None   Collection Time     08/25/13  4:09 PM      Result Value Range Status   Specimen Description BLOOD LEFT HAND   Final   Special Requests BOTTLES DRAWN AEROBIC ONLY 4CC   Final   Culture  Setup Time     Final   Value: 08/25/2013 21:59     Performed at [facility]   Culture     Final   Value: GRAM POSITIVE COCCI IN CHAINS     Note: Gram Stain Report Called to,Read Back By and Verified With: Sharlyne Pacas [id number] [place]     Performed at [facility]   Report Status PENDING   Incomplete     Studies:  Recent x-ray studies have been reviewed in detail by the Attending Physician  Time spent : 35 minutes     Erin Hearing, ANP [facility] Office  [phone] Pager [phone]  **If unable to reach the above provider after paging please contact the [place] @ [phone]  On-Call/Text Page:      [email or web address]      password [email or web address]  If 7PM-7AM, please contact night-coverage [email or web address] Password [email or web address] 08/27/2013, 2:59 PM   LOS: 2 days    I have examined the patient, reviewed the chart and modified the above note which I agree with.   Nalani Andreen,MD [phone] 08/27/2013, 5:06 PM

## 2013-08-27 NOTE — Progress Notes (Signed)
Event: RN paged NP secondary to pt becoming more SOB and tachypneic. Stat ABG, non rebreather, MSO4 ordered and NP to bedside. S: pt says she has been SOB "on and off" since arrival but seems to be worse for past few minutes. Feels like she can't get "a deep breath". Denies chest pain. Some neck upper back discomfort. No pleuritic pain. Not really coughing much. No wheezing. O: Fairly well appearing young AAF in mild resp distress. Not toxic appearing. Alert and oriented. Tachypneic but shallow. No audible wheezing. O2 sats normal. On NRB now. Resp slowing with MSO4. Lungs CTA, diminished at bases. No wheezing. S1S2 without MRG. Tachycardic in the 120s.  A/P: 1. PNA/sepsis-ABG showed very high PO2. Remove NRB and use O2 1-2L per [place] to keep O2 sats over 92% or for comfort. Will continue morphine prn. Continue reassurance. Will follow. Continue other supportive treatments and will start Xopenex prn and Mucinex q12.  Clance Boll, NP [facility]

## 2013-08-27 NOTE — Consult Note (Addendum)
[facility] for Infectious Disease    Date of Admission:  08/25/2013           Day 3 azithromycin        Day 3 ceftriaxone       Reason for Consult: Streptococcal bacteremia and Lemierre syndrome    Referring Physician: Dr. Debbe Odea   Principal Problem:   Lemierre syndrome Active Problems:   Elbow pain, right   Gram-positive bacteremia   Sepsis   CAP (community acquired pneumonia)   AKI (acute kidney injury)   Dehydration with hyponatremia   High anion gap metabolic acidosis   Leukocytosis   UTI (urinary tract infection)   Neck pain on left side   . azithromycin  500 mg Oral Q24H  . cefTRIAXone (ROCEPHIN)  IV  1 g Intravenous Q24H  . guaiFENesin  1,200 mg Oral BID  . heparin  5,000 Units Subcutaneous Q8H  . sodium chloride  3 mL Intravenous Q12H    Recommendations: 1. Continue ceftriaxone and change azithromycin to clindamycin 2. Repeat blood cultures in the morning   Assessment: She has left sided tonsillitis complicated by probable streptococcal bacteremia and left internal jugular vein septic thrombophlebitis resulting in septic pulmonary emboli. This is all compatible with Lemierre syndrome.  I will change to IV ceftriaxone and clindamycin to give little better coverage for oral pharyngeal organisms including strep and anaerobes. She will need repeat blood cultures in the morning. I do not know the explanation for her right elbow pain. I think we should simply follow her at this time.    HPI: Nicole Alexander is a 38 y.o. female who has been in good health until about 10 days ago when she developed some sore throat. Her pain worsened and she began to have intermittent fevers and chills. She was seen a local urgent care center and treated with Sudafed and prednisone. She continued to have worsening pain associated with fever, cough and shortness of breath and was admitted 2 days ago. CT scan reveals left peritonsillar edema a nonocclusive left  internal jugular vein thrombophlebitis. Imaging of her upper chest also reveals bilateral nodular infiltrates compatible with septic thrombophlebitis. Both admission blood cultures are growing gram-positive cocci in chains. She is also complaining of acute right elbow pain that bothers her when she moves her arm above her head or behind her back.   Review of Systems: Pertinent items are noted in HPI.  History reviewed. No pertinent past medical history.  History  Substance Use Topics  . Smoking status: Former Smoker -- 0.50 packs/day  . Smokeless tobacco: Never Used  . Alcohol Use: Yes     Comment: occasional    Family History  Problem Relation Age of Onset  . Heart disease Mother     PTCA/stent  . Hypertension Mother   . Diabetes Mother   . Hyperlipidemia Mother   . Cancer Father     lung  . Hypertension Father   . Diabetes Father   . Hyperlipidemia Father   . Hypertension Brother   . Cancer Maternal Grandfather     bone   Allergies  Allergen Reactions  . Penicillins Swelling    "swole up like a balloon"    OBJECTIVE: Blood pressure 141/95, pulse 119, temperature 99.9 F (37.7 C), temperature source Oral, resp. rate 37, height 5\' 9"  (1.753 m), weight 98.5 kg (217 lb 2.5 oz), last menstrual period 08/18/2013, SpO2 97.00%. General: She is sitting up  on the side of the bed attempting to eat a sandwich Skin: No rash Neck: Tender on left side of neck Oral: No obvious oropharyngeal lesions noted but somewhat difficult to see given the limited ability to open her mouth widely Lungs: Clear Cor: Regular S1 and S2 with no murmurs Abdomen: Soft and nontender Joints extremities: No obvious swelling, warmth or redness of her right elbow. She suddenly winces in pain when she lifts her elbow above her head. The pain seems to resolve promptly when she lowers her arm.  Lab Results Lab Results  Component Value Date   WBC 35.1* 08/27/2013   HGB 9.6* 08/27/2013   HCT 26.0* 08/27/2013    MCV 78.1 08/27/2013   PLT 169 08/27/2013    Lab Results  Component Value Date   CREATININE 0.85 08/27/2013   BUN 20 08/27/2013   NA 133* 08/27/2013   K 3.6* 08/27/2013   CL 103 08/27/2013   CO2 17* 08/27/2013    Lab Results  Component Value Date   ALT 100* 08/26/2013   AST 71* 08/26/2013   ALKPHOS 159* 08/26/2013   BILITOT 2.5* 08/26/2013     Microbiology: Recent Results (from the past 240 hour(s))  RAPID STREP SCREEN     Status: None   Collection Time    08/25/13 12:28 PM      Result Value Range Status   Streptococcus, Group A Screen (Direct) NEGATIVE  NEGATIVE Final   Comment: (NOTE)     A Rapid Antigen test may result negative if the antigen level in the     sample is below the detection level of this test. The FDA has not     cleared this test as a stand-alone test therefore the rapid antigen     negative result has reflexed to a Group A Strep culture.  CULTURE, GROUP A STREP     Status: None   Collection Time    08/25/13 12:28 PM      Result Value Range Status   Specimen Description THROAT   Final   Special Requests NONE   Final   Culture     Final   Value: No Beta Hemolytic Streptococci Isolated     Performed at [facility]   Report Status 08/27/2013 FINAL   Final  URINE CULTURE     Status: None   Collection Time    08/25/13 12:28 PM      Result Value Range Status   Specimen Description URINE, RANDOM   Final   Special Requests NONE   Final   Culture  Setup Time     Final   Value: 08/25/2013 18:01     Performed at [facility] Count     Final   Value: >=100,000 COLONIES/ML     Performed at [facility]   Culture     Final   Value: Multiple bacterial morphotypes present, none predominant. Suggest appropriate recollection if clinically indicated.     Performed at [facility]   Report Status 08/26/2013 FINAL   Final  MRSA PCR SCREENING     Status: None   Collection Time    08/25/13  3:52 PM      Result Value Range Status    MRSA by PCR NEGATIVE  NEGATIVE Final   Comment:            The GeneXpert MRSA Assay (FDA     approved for NASAL specimens     only), is  one component of a     comprehensive MRSA colonization     surveillance program. It is not     intended to diagnose MRSA     infection nor to guide or     monitor treatment for     MRSA infections.  CULTURE, BLOOD (ROUTINE X 2)     Status: None   Collection Time    08/25/13  4:04 PM      Result Value Range Status   Specimen Description BLOOD RIGHT HAND   Final   Special Requests BOTTLES DRAWN AEROBIC AND ANAEROBIC 3CC   Final   Culture  Setup Time     Final   Value: 08/25/2013 22:01     Performed at [facility]   Culture     Final   Value: [place] IN CHAINS     Note: Gram Stain Report Called to,Read Back By and Verified With: HEATHER RICHARD ON 08/26/2013 AT 9:48P BY WILEJ     Performed at [facility]   Report Status PENDING   Incomplete  CULTURE, BLOOD (ROUTINE X 2)     Status: None   Collection Time    08/25/13  4:09 PM      Result Value Range Status   Specimen Description BLOOD LEFT HAND   Final   Special Requests BOTTLES DRAWN AEROBIC ONLY 4CC   Final   Culture  Setup Time     Final   Value: 08/25/2013 21:59     Performed at [facility]   Culture     Final   Value: GRAM POSITIVE COCCI IN CHAINS     Note: Gram Stain Report Called to,Read Back By and Verified WithSharlyne Pacas [id number] [place]     Performed at [facility]   Report Status PENDING   Incomplete    Michel Bickers, [place] for [facility] [phone] pager   [phone] cell 08/27/2013, 6:44 PM

## 2013-08-28 DIAGNOSIS — A491 Streptococcal infection, unspecified site: Secondary | ICD-10-CM

## 2013-08-28 LAB — BASIC METABOLIC PANEL
BUN: 12 mg/dL (ref 6–23)
CALCIUM: 7.7 mg/dL — AB (ref 8.4–10.5)
CO2: 17 mEq/L — ABNORMAL LOW (ref 19–32)
CREATININE: 0.7 mg/dL (ref 0.50–1.10)
Chloride: 101 mEq/L (ref 96–112)
GFR calc Af Amer: >90 mL/min (ref >90)
Glucose, Bld: 127 mg/dL — ABNORMAL HIGH (ref 70–99)
Potassium: 3.4 mEq/L — ABNORMAL LOW (ref 3.7–5.3)
Sodium: 132 mEq/L — ABNORMAL LOW (ref 137–147)

## 2013-08-28 LAB — CBC
HCT: 22.6 % — ABNORMAL LOW (ref 36.0–46.0)
Hemoglobin: 8.2 g/dL — ABNORMAL LOW (ref 12.0–15.0)
MCH: 28.3 pg (ref 26.0–34.0)
MCHC: 36.3 g/dL — AB (ref 30.0–36.0)
MCV: 77.9 fL — ABNORMAL LOW (ref 78.0–100.0)
Platelets: 200 10*3/uL (ref 150–400)
RBC: 2.9 MIL/uL — ABNORMAL LOW (ref 3.87–5.11)
RDW: 13.4 % (ref 11.5–15.5)
WBC: 33.5 10*3/uL — ABNORMAL HIGH (ref 4.0–10.5)

## 2013-08-28 LAB — LEGIONELLA ANTIGEN, URINE: LEGIONELLA ANTIGEN, URINE: NEGATIVE

## 2013-08-28 MED ORDER — PHENOL 1.4 % MT LIQD
1.0000 | OROMUCOSAL | Status: DC | PRN
  Administered 2013-08-29: 1 via OROMUCOSAL
  Filled 2013-08-28: qty 177

## 2013-08-28 MED ORDER — LEVALBUTEROL HCL 0.63 MG/3ML IN NEBU
0.6300 mg | INHALATION_SOLUTION | Freq: Two times a day (BID) | RESPIRATORY_TRACT | Status: DC
  Administered 2013-08-28 – 2013-08-30 (×4): 0.63 mg via RESPIRATORY_TRACT
  Filled 2013-08-28 (×7): qty 3

## 2013-08-28 MED ORDER — OXYCODONE HCL 5 MG PO TABS
5.0000 mg | ORAL_TABLET | ORAL | Status: DC | PRN
  Administered 2013-08-29: 5 mg via ORAL
  Filled 2013-08-28: qty 1

## 2013-08-28 NOTE — Progress Notes (Signed)
Patient ID: Nicole Alexander, female   DOB: [birth year], 38 y.o.   MRN: [id number]         [facility] for Infect[facility]    Date of Admission:  08/25/2013           Day 4 ceftriaxone        Day 1 clindamycin  Principal Problem:   Lemierre syndrome Active Problems:   Elbow pain, right   Gram-positive bacteremia   Sepsis   CAP (community acquired pneumonia)   AKI (acute kidney injury)   Dehydration with hyponatremia   High anion gap metabolic acidosis   Leukocytosis   UTI (urinary tract infection)   Neck pain on left side   . cefTRIAXone (ROCEPHIN)  IV  1 g Intravenous Q24H  . clindamycin (CLEOCIN) IV  900 mg Intravenous 3 times per day  . guaiFENesin  1,200 mg Oral BID  . heparin  5,000 Units Subcutaneous 3 times per day  . levalbuterol  0.63 mg Nebulization BID  . sodium chloride  3 mL Intravenous Q12H    Subjective: She is feeling a little bit better. It is a little easier to swallow liquids but she still has left-sided neck pain and right elbow pain.   History reviewed. No pertinent past medical history.  History  Substance Use Topics  . Smoking status: Former Smoker -- 0.50 packs/day  . Smokeless tobacco: Never Used  . Alcohol Use: Yes     Comment: occasional    Family History  Problem Relation Age of Onset  . Heart disease Mother     PTCA/stent  . Hypertension Mother   . Diabetes Mother   . Hyperlipidemia Mother   . Cancer Father     lung  . Hypertension Father   . Diabetes Father   . Hyperlipidemia Father   . Hypertension Brother   . Cancer Maternal Grandfather     bone    Allergies  Allergen Reactions  . Penicillins Swelling    "swole up like a balloon"    Objective: Temp:  [97.7 F (36.5 C)-100 F (37.8 C)] 97.7 F (36.5 C) (02/11 1141) Pulse Rate:  [106-122] 106 (02/11 0720) Resp:  [30-48] 30 (02/11 0720) BP: (121-141)/(75-95) 141/86 mmHg (02/11 0720) SpO2:  [94 %-99 %] 98 % (02/11 1031)  General: She is slightly groggy  after receiving pain medication Skin: No rash Neck: Tender on left side anteriorly Lungs: Clear Cor: Tachycardic but regular S1 and S2 with no murmurs Abdomen: Soft and nontender Joints and extremities: No unusual warmth, swelling or redness around her right elbow but she does have intermittent pain with range of motion it appears to be hard to predict.  Lab Results Lab Results  Component Value Date   WBC 33.5* 08/28/2013   HGB 8.2* 08/28/2013   HCT 22.6* 08/28/2013   MCV 77.9* 08/28/2013   PLT 200 08/28/2013    Lab Results  Component Value Date   CREATININE 0.70 08/28/2013   BUN 12 08/28/2013   NA 132* 08/28/2013   K 3.4* 08/28/2013   CL 101 08/28/2013   CO2 17* 08/28/2013    Lab Results  Component Value Date   ALT 100* 08/26/2013   AST 71* 08/26/2013   ALKPHOS 159* 08/26/2013   BILITOT 2.5* 08/26/2013      Microbiology: Recent Results (from the past 240 hour(s))  RAPID STREP SCREEN     Status: None   Collection Time    08/25/13 12:28 PM  Result Value Ref Range Status   Streptococcus, Group A Screen (Direct) NEGATIVE  NEGATIVE Final   Comment: (NOTE)     A Rapid Antigen test may result negative if the antigen level in the     sample is below the detection level of this test. The FDA has not     cleared this test as a stand-alone test therefore the rapid antigen     negative result has reflexed to a Group A Strep culture.  CULTURE, GROUP A STREP     Status: None   Collection Time    08/25/13 12:28 PM      Result Value Ref Range Status   Specimen Description THROAT   Final   Special Requests NONE   Final   Culture     Final   Value: No Beta Hemolytic Streptococci Isolated     Performed at [facility]   Report Status 08/27/2013 FINAL   Final  URINE CULTURE     Status: None   Collection Time    08/25/13 12:28 PM      Result Value Ref Range Status   Specimen Description URINE, RANDOM   Final   Special Requests NONE   Final   Culture  Setup Time     Final   Value:  08/25/2013 18:01     Performed at [facility]     Final   Value: >=100,000 COLONIES/ML     Performed at [facility]   Culture     Final   Value: Multiple bacterial morphotypes present, none predominant. Suggest appropriate recollection if clinically indicated.     Performed at [facility]   Report Status 08/26/2013 FINAL   Final  MRSA PCR SCREENING     Status: None   Collection Time    08/25/13  3:52 PM      Result Value Ref Range Status   MRSA by PCR NEGATIVE  NEGATIVE Final   Comment:            The GeneXpert MRSA Assay (FDA     approved for NASAL specimens     only), is one component of a     comprehensive MRSA colonization     surveillance program. It is not     intended to diagnose MRSA     infection nor to guide or     monitor treatment for     MRSA infections.  CULTURE, BLOOD (ROUTINE X 2)     Status: None   Collection Time    08/25/13  4:04 PM      Result Value Ref Range Status   Specimen Description BLOOD RIGHT HAND   Final   Special Requests BOTTLES DRAWN AEROBIC AND ANAEROBIC 3CC   Final   Culture  Setup Time     Final   Value: 08/25/2013 22:01     Performed at [facility]   Culture     Final   Value: [place] IN CHAINS     Note: Gram Stain Report Called to,Read Back By and Verified With: HEATHER RICHARD ON 08/26/2013 AT 9:48P BY WILEJ     Performed at [facility]   Report Status PENDING   Incomplete  CULTURE, BLOOD (ROUTINE X 2)     Status: None   Collection Time    08/25/13  4:09 PM      Result Value Ref Range Status   Specimen Description BLOOD LEFT HAND  Final   Special Requests BOTTLES DRAWN AEROBIC ONLY 4CC   Final   Culture  Setup Time     Final   Value: 08/25/2013 21:59     Performed at [facility]   Culture     Final   Value: GRAM POSITIVE COCCI IN CHAINS     Note: Gram Stain Report Called to,Read Back By and Verified With: Sharlyne Pacas [id number]      Performed at [facility]   Report Status PENDING   Incomplete    Studies/Results: Ct Soft Tissue Neck W Contrast  08/27/2013   CLINICAL DATA:  Pain and difficulty swallowing on the left side of the neck with fever. Question neck abscess.  EXAM: CT NECK WITH CONTRAST  TECHNIQUE: Multidetector CT imaging of the neck was performed using the standard protocol following the bolus administration of intravenous contrast.  CONTRAST:  95mL OMNIPAQUE IOHEXOL 300 MG/ML  SOLN  COMPARISON:  10/27/2012  FINDINGS: The visualized portion the brain is unremarkable. Orbits are normal in appearance. Mild bilateral frontal and ethmoid sinus mucosal thickening is present. Mucous retention cysts are noted in the left sphenoid and right maxillary sinuses, and there is trace left maxillary sinus mucosal thickening. Mastoid air cells are clear.  The nasopharynx is unremarkable. There is prominent asymmetric enlargement of the left palatine tonsil with mild surrounding inflammatory change. Mild retropharyngeal edema is present. Enhancement of the tonsillar soft tissues is mildly heterogeneous with some lower attenuation centrally but no discrete, organized fluid collection identified. Enlarged left level II lymph nodes measure up to 1.2 cm in short axis. Nonocclusive filling defects are present within the midportion of the left internal jugular vein (series 2, image 64).  The submandibular and parotid glands are unremarkable. The thyroid is grossly unremarkable. The larynx is unremarkable.  Multiple rounded opacities are present in both lung apices, predominantly peripheral in location and measuring up to 1.6 cm in diameter in the right lung apex. The osseous structures are unremarkable.  IMPRESSION: Enlargement of the left palatine tonsil, consistent with tonsillitis, with nonocclusive left internal jugular vein thrombus and multiple pulmonary opacities compatible with septic emboli. Overall findings are consistent with  Lemierre syndrome. There is some heterogeneous low attenuation within the tonsil and there is mild retropharyngeal edema, however no organized, drainable fluid collection is identified.  These results were called by telephone at the time of interpretation on 08/27/2013 at 3:14 PM to Dr. Wynelle Cleveland, who verbally acknowledged these results.   Electronically Signed   By: Logan Bores   On: 08/27/2013 15:15   Dg Chest Port 1 View  08/27/2013   CLINICAL DATA:  Right middle lobe pneumonia  EXAM: PORTABLE CHEST - 1 VIEW  COMPARISON:  August 25, 2013  FINDINGS: There is been partial but incomplete clearing of patchy infiltrate from the right middle lobe. Lungs elsewhere clear. Heart is mildly prominent with normal pulmonary vascularity. No adenopathy.  IMPRESSION: Partial but incomplete clearing of right middle lobe infiltrate. No new opacity.   Electronically Signed   By: Lowella Grip M.D.   On: 08/27/2013 08:26    Assessment: She has tonsillitis, streptococcal bacteremia and septic pulmonary emboli are compatible with Lemierre syndrome. I will continue current antibiotic therapy targeting strep and other upper airway aerobics and anaerobes. Do not think there is any indication for anticoagulation at this time for her nonocclusive left internal jugular vein thrombophlebitis. I still do not know the cause of her right elbow pain would not absolutely  rule out early septic arthritis.  Plan: 1. Continue ceftriaxone and clindamycin 2. Await final blood culture results  Michel Bickers, MD [facility] for [place] [phone] pager   [phone] cell 08/28/2013, 1:24 PM

## 2013-08-28 NOTE — Progress Notes (Signed)
[facility]  Olinka Alling Ross-Clayton [id number] DOB: 04/20/1976 DOA: 08/25/2013 PCP: Antony Blackbird, MD  Brief narrative: 38 year old female patient with no significant past medical history. Endorsed cough and shortness of breath for one week. Was initially treated at urgent care and was prescribed a prednisone taper but did not see any improvement in her symptoms. Her symptoms actually worsened to the point she could not even walk to the bathroom without significant dyspnea. She also noticed fevers 3 days prior to her presentation to [facility]. Upon arrival to the emergency department patient was not hypoxic but had significant tachypnea and was tachycardic with a heart rate of 142. She was given fluid challenges. In addition her sodium was 128 with potassium of 2.5 and a bicarbonate of 17. Her BUN was 43 and creatinine was 1.9 with a lactic acid of 3.6 and white count of 27,000. Chest x-ray revealed infiltrate right middle lung.  Assessment/Plan:  Sepsis -due to pharyngitis/tonsillitis with bacteremia -Sepsis physiology is improving but has not yet resolved (remains tachy, WBC markedly elevated) -Continue supportive care -Not hypotensive so discontinued stress dose steroids 2/9 -urinary strep and legionella negative  Acute respiratory failure due to septic emboli -remains on RA -suspect infiltrate seen on CXR not PNA but is emboli with likely surrounding infarct -Continue antibiotics and supportive care -Influenza negative -check urinary strep and legionella -WBC trending down slowly  GPC bacteremia - ID following -prelim with GPCs in chains so suspect strep -May need TTE to r/o endocarditis -repeat blood cx's obtained  Right elbow pain -There is point tenderness noted at the lateral aspect of the right elbow joint but no current erythema or appreciable effusion - there is concern of course that this joint could have been seeded with  the patient's bacteremia - we will continue antibiotics as noted above - should the patient develop an effusion or erythema of the joint or should the pain not improve with ongoing antibiotic therapy we will obtain an MRI of the joint and consider Orthopedic consultation  Left neck pain/swelling - due to septic thrombophlebitis (non occlusive) of the IJ in setting of acute pharyngitis/tonsilitis with multiple septic pulmonary embolization (Lemierre syndrome)-blood cx's with GPCs in chains -consult ID -UptoDate rec anticoagulation only in setting of extensive phlebitis -add prn Oxycodone to IV MSO4 -add Chloraseptic spray for sore throat  ? UTI -> 100,000 colonies multiple morphotypes  Acute kidney injury -Improved after hydration so continue to follow  Dehydration with hyponatremia -Sodium increased with rehydration -positive 5700 cc so decrease IVFs-oral intake improved  Lactic acidosis  -Secondary to low perfusion from sepsis - resolving  DVT prophylaxis: Subcutaneous heparin Code Status: Full Family Communication: No family at bedside Disposition Plan/Expected LOS: SDU  Consultants:  ID  Procedures: None  Antibiotics: Zithromax 2/9 >>> 2/10 Rocephin 2/9 >>> Clindamycin 2/10 >>>  HPI/Subjective: Alert and says still with sore throat but seems a little improved. No further resp sx's overnight  Objective: Blood pressure 137/85, pulse 106, temperature 97.7 F (36.5 C), temperature source Oral, resp. rate 30, height 5\' 9"  (1.753 m), weight 217 lb 2.5 oz (98.5 kg), last menstrual period 08/18/2013, SpO2 98.00%.  Intake/Output Summary (Last 24 hours) at 08/28/13 1457 Last data filed at 08/28/13 1206  Gross per 24 hour  Intake   4263 ml  Output   2450 ml  Net   1813 ml   Exam: General: No acute respiratory distress ENT: remains tender left neck  esp at jaw line angle just below pinna Lungs: Right basilar and mid field crackles with no wheeze Cardiovascular:  Tachycardic but regular with no appreciable rub or murmur - no peripheral edema Abdomen: Nontender, nondistended, soft, bowel sounds positive, no rebound, no ascites, no appreciable mass Musculoskeletal: No significant cyanosis, clubbing of bilateral lower extremities-right elbow without erythema or effusion- sharp severe pain reproducible with arm flexed and raised above head Neurological: Alert and oriented x 3, moves all extremities x 4 without focal neurological deficits, CN 2-12 intact  Scheduled Meds:  Scheduled Meds: . cefTRIAXone (ROCEPHIN)  IV  1 g Intravenous Q24H  . clindamycin (CLEOCIN) IV  900 mg Intravenous 3 times per day  . guaiFENesin  1,200 mg Oral BID  . heparin  5,000 Units Subcutaneous 3 times per day  . levalbuterol  0.63 mg Nebulization BID  . sodium chloride  3 mL Intravenous Q12H   Continuous Infusions: . sodium chloride 150 mL/hr at 08/28/13 0344    Data Reviewed: Basic Metabolic Panel:  Recent Labs Lab 08/25/13 1140 08/25/13 1725 08/26/13 0315 08/27/13 0240 08/28/13 0230  NA 128*  --  136* 133* 132*  K 2.5*  --  3.7 3.6* 3.4*  CL 88*  --  102 103 101  CO2 17*  --  18* 17* 17*  GLUCOSE 145*  --  134* 116* 127*  BUN 43*  --  32* 20 12  CREATININE 1.98* 1.44* 1.14* 0.85 0.70  CALCIUM 8.4  --  8.1* 7.7* 7.7*   Liver Function Tests:  Recent Labs Lab 08/25/13 1140 08/26/13 0315  AST 127* 71*  ALT 119* 100*  ALKPHOS 176* 159*  BILITOT 3.0* 2.5*  PROT 7.3 7.1  ALBUMIN 2.3* 2.0*   No results found for this basename: LIPASE, AMYLASE,  in the last 168 hours No results found for this basename: AMMONIA,  in the last 168 hours  CBC:  Recent Labs Lab 08/25/13 1140 08/25/13 1725 08/26/13 0315 08/27/13 0240 08/28/13 0230  WBC 27.5* 26.8* 35.7* 35.1* 33.5*  HGB 11.3* 10.3* 10.3* 9.6* 8.2*  HCT 30.6* 27.7* 28.1* 26.0* 22.6*  MCV 78.3 77.8* 78.3 78.1 77.9*  PLT 127* 130* 136* 169 200    Recent Results (from the past 240 hour(s))  RAPID  STREP SCREEN     Status: None   Collection Time    08/25/13 12:28 PM      Result Value Ref Range Status   Streptococcus, Group A Screen (Direct) NEGATIVE  NEGATIVE Final   Comment: (NOTE)     A Rapid Antigen test may result negative if the antigen level in the     sample is below the detection level of this test. The FDA has not     cleared this test as a stand-alone test therefore the rapid antigen     negative result has reflexed to a Group A Strep culture.  CULTURE, GROUP A STREP     Status: None   Collection Time    08/25/13 12:28 PM      Result Value Ref Range Status   Specimen Description THROAT   Final   Special Requests NONE   Final   Culture     Final   Value: No Beta Hemolytic Streptococci Isolated     Performed at [facility]   Report Status 08/27/2013 FINAL   Final  URINE CULTURE     Status: None   Collection Time    08/25/13 12:28 PM  Result Value Ref Range Status   Specimen Description URINE, RANDOM   Final   Special Requests NONE   Final   Culture  Setup Time     Final   Value: 08/25/2013 18:01     Performed at [facility]     Final   Value: >=100,000 COLONIES/ML     Performed at [facility]   Culture     Final   Value: Multiple bacterial morphotypes present, none predominant. Suggest appropriate recollection if clinically indicated.     Performed at [facility]   Report Status 08/26/2013 FINAL   Final  MRSA PCR SCREENING     Status: None   Collection Time    08/25/13  3:52 PM      Result Value Ref Range Status   MRSA by PCR NEGATIVE  NEGATIVE Final   Comment:            The GeneXpert MRSA Assay (FDA     approved for NASAL specimens     only), is one component of a     comprehensive MRSA colonization     surveillance program. It is not     intended to diagnose MRSA     infection nor to guide or     monitor treatment for     MRSA infections.  CULTURE, BLOOD (ROUTINE X 2)     Status: None    Collection Time    08/25/13  4:04 PM      Result Value Ref Range Status   Specimen Description BLOOD RIGHT HAND   Final   Special Requests BOTTLES DRAWN AEROBIC AND ANAEROBIC 3CC   Final   Culture  Setup Time     Final   Value: 08/25/2013 22:01     Performed at [facility]   Culture     Final   Value: [place] IN CHAINS     Note: Gram Stain Report Called to,Read Back By and Verified With: HEATHER RICHARD ON 08/26/2013 AT 9:48P BY WILEJ     Performed at [facility]   Report Status PENDING   Incomplete  CULTURE, BLOOD (ROUTINE X 2)     Status: None   Collection Time    08/25/13  4:09 PM      Result Value Ref Range Status   Specimen Description BLOOD LEFT HAND   Final   Special Requests BOTTLES DRAWN AEROBIC ONLY 4CC   Final   Culture  Setup Time     Final   Value: 08/25/2013 21:59     Performed at [facility]   Culture     Final   Value: GRAM POSITIVE COCCI IN CHAINS     Note: Gram Stain Report Called to,Read Back By and Verified With: Sharlyne Pacas [id number] [id number] [place]     Performed at [facility]   Report Status PENDING   Incomplete     Studies:  Recent x-ray studies have been reviewed in detail by the Attending Physician  Time spent : 35 minutes     Erin Hearing, ANP [facility] Office  [phone] Pager [phone]  **If unable to reach the above provider after paging please contact the [place] @ [phone]  On-Call/Text Page:      [email or web address]      password [email or web address]  If 7PM-7AM, please contact night-coverage [email or web address] Password [email or web address] 08/28/2013, 2:57 PM   LOS: 3 days    I have personally examined  this patient and reviewed the entire database. I have reviewed the above note, made any necessary editorial changes, and agree with its content.  Cherene Altes, MD [facility]

## 2013-08-28 NOTE — Progress Notes (Signed)
Respiratory therapy note- Patient c/o o right arm pain and throat pain, RN notified. HHNeb given at this time. Continue to monitor.

## 2013-08-29 DIAGNOSIS — R509 Fever, unspecified: Secondary | ICD-10-CM

## 2013-08-29 DIAGNOSIS — M542 Cervicalgia: Secondary | ICD-10-CM

## 2013-08-29 LAB — CBC
HCT: 21.4 % — ABNORMAL LOW (ref 36.0–46.0)
HEMOGLOBIN: 7.8 g/dL — AB (ref 12.0–15.0)
MCH: 28.6 pg (ref 26.0–34.0)
MCHC: 36.4 g/dL — AB (ref 30.0–36.0)
MCV: 78.4 fL (ref 78.0–100.0)
PLATELETS: 247 10*3/uL (ref 150–400)
RBC: 2.73 MIL/uL — AB (ref 3.87–5.11)
RDW: 13.2 % (ref 11.5–15.5)
WBC: 38 10*3/uL — ABNORMAL HIGH (ref 4.0–10.5)

## 2013-08-29 LAB — BASIC METABOLIC PANEL
BUN: 9 mg/dL (ref 6–23)
CO2: 18 mEq/L — ABNORMAL LOW (ref 19–32)
Calcium: 7.4 mg/dL — ABNORMAL LOW (ref 8.4–10.5)
Chloride: 101 mEq/L (ref 96–112)
Creatinine, Ser: 0.65 mg/dL (ref 0.50–1.10)
GFR calc Af Amer: >90 mL/min (ref >90)
GFR calc non Af Amer: >90 mL/min (ref >90)
GLUCOSE: 122 mg/dL — AB (ref 70–99)
POTASSIUM: 3.3 meq/L — AB (ref 3.7–5.3)
SODIUM: 133 meq/L — AB (ref 137–147)

## 2013-08-29 LAB — CULTURE, BLOOD (ROUTINE X 2)

## 2013-08-29 LAB — MAGNESIUM: Magnesium: 1.5 mg/dL (ref 1.5–2.5)

## 2013-08-29 MED ORDER — ZOLPIDEM TARTRATE 5 MG PO TABS
5.0000 mg | ORAL_TABLET | Freq: Every evening | ORAL | Status: DC | PRN
  Administered 2013-09-03: 5 mg via ORAL
  Filled 2013-08-29: qty 1

## 2013-08-29 MED ORDER — PANTOPRAZOLE SODIUM 40 MG PO TBEC
40.0000 mg | DELAYED_RELEASE_TABLET | Freq: Every day | ORAL | Status: DC
  Administered 2013-08-29 – 2013-09-04 (×7): 40 mg via ORAL
  Filled 2013-08-29 (×3): qty 1

## 2013-08-29 MED ORDER — MAGNESIUM SULFATE 40 MG/ML IJ SOLN
2.0000 g | Freq: Once | INTRAMUSCULAR | Status: AC
  Administered 2013-08-29: 2 g via INTRAVENOUS
  Filled 2013-08-29: qty 50

## 2013-08-29 MED ORDER — KETOROLAC TROMETHAMINE 15 MG/ML IJ SOLN
15.0000 mg | Freq: Four times a day (QID) | INTRAMUSCULAR | Status: AC
  Administered 2013-08-29 – 2013-09-03 (×20): 15 mg via INTRAVENOUS
  Filled 2013-08-29 (×29): qty 1

## 2013-08-29 MED ORDER — BOOST PLUS PO LIQD
237.0000 mL | Freq: Three times a day (TID) | ORAL | Status: DC
  Administered 2013-08-30 – 2013-09-03 (×7): 237 mL via ORAL
  Filled 2013-08-29 (×23): qty 237

## 2013-08-29 MED ORDER — POTASSIUM CHLORIDE CRYS ER 20 MEQ PO TBCR
40.0000 meq | EXTENDED_RELEASE_TABLET | ORAL | Status: AC
  Administered 2013-08-29 (×2): 40 meq via ORAL
  Filled 2013-08-29 (×2): qty 2

## 2013-08-29 MED ORDER — OXYCODONE HCL 5 MG PO TABS
5.0000 mg | ORAL_TABLET | ORAL | Status: DC | PRN
  Administered 2013-08-29 – 2013-09-03 (×11): 10 mg via ORAL
  Administered 2013-09-03: 5 mg via ORAL
  Administered 2013-09-04: 10 mg via ORAL
  Filled 2013-08-29 (×14): qty 2

## 2013-08-29 MED ORDER — BOOST / RESOURCE BREEZE PO LIQD
1.0000 | Freq: Three times a day (TID) | ORAL | Status: DC
  Administered 2013-08-29 – 2013-09-04 (×6): 1 via ORAL

## 2013-08-29 NOTE — Progress Notes (Signed)
[facility]  Nicole Alexander DDU:[id number] DOB: 11-30-1975 DOA: 08/25/2013 PCP: Antony Blackbird, MD  Brief narrative: 38 year old female patient with no significant past medical history. Endorsed cough and shortness of breath for one week. Was initially treated at urgent care and was prescribed a prednisone taper but did not see any improvement in her symptoms. Her symptoms actually worsened to the point she could not even walk to the bathroom without significant dyspnea. She also noticed fevers 3 days prior to her presentation to [facility]. Upon arrival to the emergency department patient was not hypoxic but had significant tachypnea and was tachycardic with a heart rate of 142. She was given fluid challenges. In addition her sodium was 128 with potassium of 2.5 and a bicarbonate of 17. Her BUN was 43 and creatinine was 1.9 with a lactic acid of 3.6 and white count of 27,000. Chest x-ray revealed infiltrate right middle lung.  Assessment/Plan:  Sepsis -due to pharyngitis/tonsillitis with bacteremia -Sepsis physiology is improving but has not yet resolved (remains tachy, WBC markedly elevated) -Continue supportive care -Not hypotensive so discontinued stress dose steroids 2/9 -urinary strep and legionella negative  Acute respiratory failure due to septic emboli -remains on RA -suspect infiltrate seen on CXR not PNA but is emboli with likely surrounding infarct -Continue antibiotics and supportive care -Influenza negative -check urinary strep and legionella -WBC trend up subtley-follow -ID documents consider repeating CT scan and consult ENT  Microaerophilic streptococci bacteremia - ID following -repeat blood cx's obtained 2/11   Hypokalemia -Replete -Magnesium level 1.5 so will give IV bolus and FU in am -check phosphorus in am  Right elbow pain/right knee pain -There is point tenderness noted at the lateral aspect of the right  elbow joint but no current erythema or appreciable effusion - there is concern of course that this joint could have been seeded with the patient's bacteremia - we will continue antibiotics as noted above - should the patient develop an effusion or erythema of the joint or should the pain not improve with ongoing antibiotic therapy we will obtain an MRI of the joint and consider Orthopedic consultation  Left neck pain/swelling - due to septic thrombophlebitis (non occlusive) of the IJ in setting of acute pharyngitis/tonsilitis with multiple septic pulmonary embolization (Lemierre syndrome)-blood cx's with GPCs in chains -consult ID -UptoDate rec anticoagulation only in setting of extensive phlebitis -add prn Oxycodone to IV MSO4-still with inadequate pain control so increase dosage from 520-10 mg every 4 hours as needed -Add scheduled Toradol with Pepcid -add Chloraseptic spray for sore throat  ? UTI -> 100,000 colonies multiple morphotypes  Acute kidney injury -Improved after hydration so continue to follow  Dehydration with hyponatremia -Sodium increased with rehydration -positive 5700 cc so decrease IVFs-oral intake improved -Markedly improved with adequate urinary output to decrease IV fluids to KVO -Add supplements with resource and/or boost  Lactic acidosis  -Secondary to low perfusion from sepsis - resolving  DVT prophylaxis: Subcutaneous heparin Code Status: Full Family Communication: No family at bedside Disposition Plan/Expected LOS: SDU  Consultants:  ID  Procedures: None  Antibiotics: Zithromax 2/9 >>> 2/10 Rocephin 2/9 >>> Clindamycin 2/10 >>>  HPI/Subjective: Alert primary complaint of continued sore throat and difficulty swallowing. Also now with some right knee discomfort  Objective: Blood pressure 126/96, pulse 112, temperature 99.3 F (37.4 C), temperature source Oral, resp. rate 38, height 5\' 9"  (1.753 m), weight 217 lb 2.5 oz (98.5 kg),  last menstrual  period 08/18/2013, SpO2 98.00%.  Intake/Output Summary (Last 24 hours) at 08/29/13 1326 Last data filed at 08/29/13 0900  Gross per 24 hour  Intake 3033.33 ml  Output   2277 ml  Net 756.33 ml   Exam: General: No acute respiratory distress ENT: remains tender left neck esp at jaw line angle just below pinna Lungs: Right basilar and mid field crackles with no wheeze Cardiovascular: Tachycardic but regular with no appreciable rub or murmur - no peripheral edema Abdomen: Nontender, nondistended, soft, bowel sounds positive, no rebound, no ascites, no appreciable mass Musculoskeletal: No significant cyanosis, clubbing of bilateral lower extremities-right elbow without erythema or effusion- right knee without reproducible pain on palpation and no erythema or effusion Neurological: Alert and oriented x 3, moves all extremities x 4 without focal neurological deficits, CN 2-12 intact  Scheduled Meds:  Scheduled Meds: . cefTRIAXone (ROCEPHIN)  IV  1 g Intravenous Q24H  . clindamycin (CLEOCIN) IV  900 mg Intravenous 3 times per day  . lactose free nutrition  237 mL Oral TID WC   Or  . feeding supplement (RESOURCE BREEZE)  1 Container Oral TID WC  . guaiFENesin  1,200 mg Oral BID  . heparin  5,000 Units Subcutaneous 3 times per day  . ketorolac  15 mg Intravenous 4 times per day  . levalbuterol  0.63 mg Nebulization BID  . pantoprazole  40 mg Oral Daily  . potassium chloride  40 mEq Oral Q4H  . sodium chloride  3 mL Intravenous Q12H   Continuous Infusions: . sodium chloride 20 mL/hr at 08/29/13 1051    Data Reviewed: Basic Metabolic Panel:  Recent Labs Lab 08/25/13 1140 08/25/13 1725 08/26/13 0315 08/27/13 0240 08/28/13 0230 08/29/13 0251  NA 128*  --  136* 133* 132* 133*  K 2.5*  --  3.7 3.6* 3.4* 3.3*  CL 88*  --  102 103 101 101  CO2 17*  --  18* 17* 17* 18*  GLUCOSE 145*  --  134* 116* 127* 122*  BUN 43*  --  32* 20 12 9   CREATININE 1.98* 1.44* 1.14* 0.85 0.70 0.65    CALCIUM 8.4  --  8.1* 7.7* 7.7* 7.4*   Liver Function Tests:  Recent Labs Lab 08/25/13 1140 08/26/13 0315  AST 127* 71*  ALT 119* 100*  ALKPHOS 176* 159*  BILITOT 3.0* 2.5*  PROT 7.3 7.1  ALBUMIN 2.3* 2.0*   No results found for this basename: LIPASE, AMYLASE,  in the last 168 hours No results found for this basename: AMMONIA,  in the last 168 hours  CBC:  Recent Labs Lab 08/25/13 1725 08/26/13 0315 08/27/13 0240 08/28/13 0230 08/29/13 0251  WBC 26.8* 35.7* 35.1* 33.5* 38.0*  HGB 10.3* 10.3* 9.6* 8.2* 7.8*  HCT 27.7* 28.1* 26.0* 22.6* 21.4*  MCV 77.8* 78.3 78.1 77.9* 78.4  PLT 130* 136* 169 200 247    Recent Results (from the past 240 hour(s))  RAPID STREP SCREEN     Status: None   Collection Time    08/25/13 12:28 PM      Result Value Ref Range Status   Streptococcus, Group A Screen (Direct) NEGATIVE  NEGATIVE Final   Comment: (NOTE)     A Rapid Antigen test may result negative if the antigen level in the     sample is below the detection level of this test. The FDA has not     cleared this test as a stand-alone test therefore the rapid antigen  negative result has reflexed to a Group A Strep culture.  CULTURE, GROUP A STREP     Status: None   Collection Time    08/25/13 12:28 PM      Result Value Ref Range Status   Specimen Description THROAT   Final   Special Requests NONE   Final   Culture     Final   Value: No Beta Hemolytic Streptococci Isolated     Performed at [facility]   Report Status 08/27/2013 FINAL   Final  URINE CULTURE     Status: None   Collection Time    08/25/13 12:28 PM      Result Value Ref Range Status   Specimen Description URINE, RANDOM   Final   Special Requests NONE   Final   Culture  Setup Time     Final   Value: 08/25/2013 18:01     Performed at [place]     Final   Value: >=100,000 COLONIES/ML     Performed at [facility]   Culture     Final   Value: Multiple bacterial  morphotypes present, none predominant. Suggest appropriate recollection if clinically indicated.     Performed at [facility]   Report Status 08/26/2013 FINAL   Final  MRSA PCR SCREENING     Status: None   Collection Time    08/25/13  3:52 PM      Result Value Ref Range Status   MRSA by PCR NEGATIVE  NEGATIVE Final   Comment:            The GeneXpert MRSA Assay (FDA     approved for NASAL specimens     only), is one component of a     comprehensive MRSA colonization     surveillance program. It is not     intended to diagnose MRSA     infection nor to guide or     monitor treatment for     MRSA infections.  CULTURE, BLOOD (ROUTINE X 2)     Status: None   Collection Time    08/25/13  4:04 PM      Result Value Ref Range Status   Specimen Description BLOOD RIGHT HAND   Final   Special Requests BOTTLES DRAWN AEROBIC AND ANAEROBIC 3CC   Final   Culture  Setup Time     Final   Value: 08/25/2013 22:01     Performed at [facility]   Culture     Final   Value: MICROAEROPHILIC STREPTOCOCCI     Note: Standardized susceptibility testing for this organism is not available.     Note: Gram Stain Report Called to,Read Back By and Verified With: HEATHER RICHARD ON 08/26/2013 AT 9:48P BY Dennard Nip     Performed at [facility]   Report Status 08/29/2013 FINAL   Final  CULTURE, BLOOD (ROUTINE X 2)     Status: None   Collection Time    08/25/13  4:09 PM      Result Value Ref Range Status   Specimen Description BLOOD LEFT HAND   Final   Special Requests BOTTLES DRAWN AEROBIC ONLY 4CC   Final   Culture  Setup Time     Final   Value: 08/25/2013 21:59     Performed at [facility]   Culture     Final   Value: MICROAEROPHILIC STREPTOCOCCI     Note: Standardized susceptibility testing  for this organism is not available.     Note: Gram Stain Report Called to,Read Back By and Verified With: Sharlyne Pacas [id number] [id number] [place]     Performed at [facility]    Report Status 08/29/2013 FINAL   Final  CULTURE, BLOOD (ROUTINE X 2)     Status: None   Collection Time    08/28/13  8:05 AM      Result Value Ref Range Status   Specimen Description BLOOD LEFT HAND   Final   Special Requests BOTTLES DRAWN AEROBIC ONLY 10CC   Final   Culture  Setup Time     Final   Value: 08/28/2013 14:27     Performed at [facility]   Culture     Final   Value:        BLOOD CULTURE RECEIVED NO GROWTH TO DATE CULTURE WILL BE HELD FOR 5 DAYS BEFORE ISSUING A FINAL NEGATIVE REPORT     Performed at [facility]   Report Status PENDING   Incomplete  CULTURE, BLOOD (ROUTINE X 2)     Status: None   Collection Time    08/28/13  8:10 AM      Result Value Ref Range Status   Specimen Description BLOOD RIGHT HAND   Final   Special Requests BOTTLES DRAWN AEROBIC AND ANAEROBIC 10CC   Final   Culture  Setup Time     Final   Value: 08/28/2013 14:27     Performed at [facility]   Culture     Final   Value:        BLOOD CULTURE RECEIVED NO GROWTH TO DATE CULTURE WILL BE HELD FOR 5 DAYS BEFORE ISSUING A FINAL NEGATIVE REPORT     Performed at [facility]   Report Status PENDING   Incomplete     Studies:  Recent x-ray studies have been reviewed in detail by the Attending Physician  Time spent : 35 minutes     Erin Hearing, ANP [facility] Office  [phone] Pager [phone]  **If unable to reach the above provider after paging please contact the [place] @ [phone]  On-Call/Text Page:      [email or web address]      password [email or web address]  If 7PM-7AM, please contact night-coverage [email or web address] Password [email or web address] 08/29/2013, 1:26 PM   LOS: 4 days   I have examined the patient, reviewed the chart and modified the above note which I agree with.   Shaqueena Mauceri,MD [id number] 08/29/2013, 5:35 PM

## 2013-08-29 NOTE — Progress Notes (Signed)
Utilization review completed.  

## 2013-08-29 NOTE — Progress Notes (Signed)
Patient ID: Nicole Alexander, female   DOB: [birth year], 38 y.o.   MRN: [id number]         [facility]    Date of Admission:  08/25/2013           Day 5 ceftriaxone        Day 2 clindamycin  Principal Problem:   Lemierre syndrome Active Problems:   Elbow pain, right   Gram-positive bacteremia   Sepsis   CAP (community acquired pneumonia)   AKI (acute kidney injury)   Dehydration with hyponatremia   High anion gap metabolic acidosis   Leukocytosis   UTI (urinary tract infection)   Neck pain on left side   . cefTRIAXone (ROCEPHIN)  IV  1 g Intravenous Q24H  . clindamycin (CLEOCIN) IV  900 mg Intravenous 3 times per day  . lactose free nutrition  237 mL Oral TID WC   Or  . feeding supplement (RESOURCE BREEZE)  1 Container Oral TID WC  . guaiFENesin  1,200 mg Oral BID  . heparin  5,000 Units Subcutaneous 3 times per day  . ketorolac  15 mg Intravenous 4 times per day  . levalbuterol  0.63 mg Nebulization BID  . pantoprazole  40 mg Oral Daily  . potassium chloride  40 mEq Oral Q4H  . sodium chloride  3 mL Intravenous Q12H    Subjective: She states that her right elbow pain is slightly better but she is having more difficulty swallowing and pain when swallowing. She is also now having a little bit of discomfort in her right knee.   History reviewed. No pertinent past medical history.  History  Substance Use Topics  . Smoking status: Former Smoker -- 0.50 packs/day  . Smokeless tobacco: Never Used  . Alcohol Use: Yes     Comment: occasional    Family History  Problem Relation Age of Onset  . Heart disease Mother     PTCA/stent  . Hypertension Mother   . Diabetes Mother   . Hyperlipidemia Mother   . Cancer Father     lung  . Hypertension Father   . Diabetes Father   . Hyperlipidemia Father   . Hypertension Brother   . Cancer Maternal Grandfather     bone    Allergies  Allergen Reactions  . Penicillins Swelling    "swole up  like a balloon"    Objective: Temp:  [98.8 F (37.1 C)-101.4 F (38.6 C)] 99.3 F (37.4 C) (02/12 1229) Pulse Rate:  [110-115] 112 (02/12 0800) Resp:  [25-43] 25 (02/12 0800) BP: (126-144)/(77-88) 136/88 mmHg (02/12 0800) SpO2:  [95 %-100 %] 96 % (02/12 0800)  General: She is slightly groggy after receiving pain medication Skin: No rash Neck: No change in tenderness on left side anteriorly Oral: Difficult exam because of limited ability for her to open her mouth Lungs: Clear Cor: Tachycardic but regular S1 and S2 with no murmurs Abdomen: Soft and nontender Joints and extremities: No unusual warmth, swelling or redness around her right elbow or right knee Lab Results Lab Results  Component Value Date   WBC 38.0* 08/29/2013   HGB 7.8* 08/29/2013   HCT 21.4* 08/29/2013   MCV 78.4 08/29/2013   PLT 247 08/29/2013    Lab Results  Component Value Date   CREATININE 0.65 08/29/2013   BUN 9 08/29/2013   NA 133* 08/29/2013   K 3.3* 08/29/2013   CL 101 08/29/2013   CO2 18* 08/29/2013  Lab Results  Component Value Date   ALT 100* 08/26/2013   AST 71* 08/26/2013   ALKPHOS 159* 08/26/2013   BILITOT 2.5* 08/26/2013      Microbiology: Recent Results (from the past 240 hour(s))  RAPID STREP SCREEN     Status: None   Collection Time    08/25/13 12:28 PM      Result Value Ref Range Status   Streptococcus, Group A Screen (Direct) NEGATIVE  NEGATIVE Final   Comment: (NOTE)     A Rapid Antigen test may result negative if the antigen level in the     sample is below the detection level of this test. The FDA has not     cleared this test as a stand-alone test therefore the rapid antigen     negative result has reflexed to a Group A Strep culture.  CULTURE, GROUP A STREP     Status: None   Collection Time    08/25/13 12:28 PM      Result Value Ref Range Status   Specimen Description THROAT   Final   Special Requests NONE   Final   Culture     Final   Value: No Beta Hemolytic Streptococci  Isolated     Performed at [facility]   Report Status 08/27/2013 FINAL   Final  URINE CULTURE     Status: None   Collection Time    08/25/13 12:28 PM      Result Value Ref Range Status   Specimen Description URINE, RANDOM   Final   Special Requests NONE   Final   Culture  Setup Time     Final   Value: 08/25/2013 18:01     Performed at [facility]     Final   Value: >=100,000 COLONIES/ML     Performed at [facility]   Culture     Final   Value: Multiple bacterial morphotypes present, none predominant. Suggest appropriate recollection if clinically indicated.     Performed at [facility]   Report Status 08/26/2013 FINAL   Final  MRSA PCR SCREENING     Status: None   Collection Time    08/25/13  3:52 PM      Result Value Ref Range Status   MRSA by PCR NEGATIVE  NEGATIVE Final   Comment:            The GeneXpert MRSA Assay (FDA     approved for NASAL specimens     only), is one component of a     comprehensive MRSA colonization     surveillance program. It is not     intended to diagnose MRSA     infection nor to guide or     monitor treatment for     MRSA infections.  CULTURE, BLOOD (ROUTINE X 2)     Status: None   Collection Time    08/25/13  4:04 PM      Result Value Ref Range Status   Specimen Description BLOOD RIGHT HAND   Final   Special Requests BOTTLES DRAWN AEROBIC AND ANAEROBIC 3CC   Final   Culture  Setup Time     Final   Value: 08/25/2013 22:01     Performed at [facility]   Culture     Final   Value: MICROAEROPHILIC STREPTOCOCCI     Note: Standardized susceptibility testing for this organism is not available.     Note:  Gram Stain Report Called to,Read Back By and Verified With: HEATHER RICHARD ON 08/26/2013 AT 9:48P BY Dennard Nip     Performed at [facility]   Report Status 08/29/2013 FINAL   Final  CULTURE, BLOOD (ROUTINE X 2)     Status: None   Collection Time    08/25/13  4:09 PM      Result  Value Ref Range Status   Specimen Description BLOOD LEFT HAND   Final   Special Requests BOTTLES DRAWN AEROBIC ONLY 4CC   Final   Culture  Setup Time     Final   Value: 08/25/2013 21:59     Performed at [facility]   Culture     Final   Value: MICROAEROPHILIC STREPTOCOCCI     Note: Standardized susceptibility testing for this organism is not available.     Note: Gram Stain Report Called to,Read Back By and Verified With: Sharlyne Pacas [id number] BRMEL     Performed at [facility]   Report Status 08/29/2013 FINAL   Final  CULTURE, BLOOD (ROUTINE X 2)     Status: None   Collection Time    08/28/13  8:05 AM      Result Value Ref Range Status   Specimen Description BLOOD LEFT HAND   Final   Special Requests BOTTLES DRAWN AEROBIC ONLY 10CC   Final   Culture  Setup Time     Final   Value: 08/28/2013 14:27     Performed at [facility]   Culture     Final   Value:        BLOOD CULTURE RECEIVED NO GROWTH TO DATE CULTURE WILL BE HELD FOR 5 DAYS BEFORE ISSUING A FINAL NEGATIVE REPORT     Performed at [facility]   Report Status PENDING   Incomplete  CULTURE, BLOOD (ROUTINE X 2)     Status: None   Collection Time    08/28/13  8:10 AM      Result Value Ref Range Status   Specimen Description BLOOD RIGHT HAND   Final   Special Requests BOTTLES DRAWN AEROBIC AND ANAEROBIC 10CC   Final   Culture  Setup Time     Final   Value: 08/28/2013 14:27     Performed at [facility]   Culture     Final   Value:        BLOOD CULTURE RECEIVED NO GROWTH TO DATE CULTURE WILL BE HELD FOR 5 DAYS BEFORE ISSUING A FINAL NEGATIVE REPORT     Performed at [facility]   Report Status PENDING   Incomplete    Studies/Results: No results found.  Assessment: She is having increased fever, leukocytosis and neck pain. We will need to consider repeat CT scan and ENT evaluation soon if she does not significantly better.  Plan: 1. Continue ceftriaxone and  clindamycin 2. Consider repeat CT scan and ENT evaluation  Michel Bickers, MD [facility] for [place] [phone] pager   [phone] cell 08/29/2013, 12:50 PM

## 2013-08-30 LAB — BASIC METABOLIC PANEL
BUN: 7 mg/dL (ref 6–23)
CALCIUM: 7.8 mg/dL — AB (ref 8.4–10.5)
CO2: 20 mEq/L (ref 19–32)
Chloride: 100 mEq/L (ref 96–112)
Creatinine, Ser: 0.68 mg/dL (ref 0.50–1.10)
GFR calc non Af Amer: >90 mL/min (ref >90)
Glucose, Bld: 97 mg/dL (ref 70–99)
POTASSIUM: 4.1 meq/L (ref 3.7–5.3)
SODIUM: 132 meq/L — AB (ref 137–147)

## 2013-08-30 LAB — CBC
HEMATOCRIT: 20.8 % — AB (ref 36.0–46.0)
Hemoglobin: 7.6 g/dL — ABNORMAL LOW (ref 12.0–15.0)
MCH: 29.1 pg (ref 26.0–34.0)
MCHC: 36.5 g/dL — AB (ref 30.0–36.0)
MCV: 79.7 fL (ref 78.0–100.0)
Platelets: 318 10*3/uL (ref 150–400)
RBC: 2.61 MIL/uL — ABNORMAL LOW (ref 3.87–5.11)
RDW: 13.7 % (ref 11.5–15.5)
WBC: 30 10*3/uL — ABNORMAL HIGH (ref 4.0–10.5)

## 2013-08-30 LAB — PHOSPHORUS: Phosphorus: 3.2 mg/dL (ref 2.3–4.6)

## 2013-08-30 LAB — MAGNESIUM: Magnesium: 1.9 mg/dL (ref 1.5–2.5)

## 2013-08-30 NOTE — Progress Notes (Signed)
[facility]  Nicole Alexander WNU:[id number] DOB: [birth year] DOA: 08/25/2013 PCP: Antony Blackbird, MD  Brief narrative: 38 year old female patient with no significant past medical history. Endorsed cough and shortness of breath for one week. Was initially treated at urgent care and was prescribed a prednisone taper but did not see any improvement in her symptoms. Her symptoms actually worsened to the point she could not even walk to the bathroom without significant dyspnea. She also noticed fevers 3 days prior to her presentation to [facility]. Upon arrival to the emergency department patient was not hypoxic but had significant tachypnea and was tachycardic with a heart rate of 142. She was given fluid challenges. In addition her sodium was 128 with potassium of 2.5 and a bicarbonate of 17. Her BUN was 43 and creatinine was 1.9 with a lactic acid of 3.6 and white count of 27,000. Chest x-ray revealed infiltrate right middle lung.  Assessment/Plan:  Sepsis -due to pharyngitis/tonsillitis with bacteremia -Sepsis physiology is improving but has not yet resolved (remains tachy, WBC markedly elevated) -Continue supportive care -Not hypotensive so discontinued stress dose steroids 2/9 -urinary strep and legionella negative  Acute respiratory failure due to septic emboli -remains on RA -suspect infiltrate seen on CXR not PNA but is emboli with likely surrounding infarct -Continue antibiotics and supportive care -Influenza negative -urinary strep and legionella neg -WBC trend down finally -consider repeating CT scan and consult ENT  Left neck pain/swelling 2/2 septic thrombophlebitis - due to septic thrombophlebitis (non occlusive) of the IJ in setting of acute pharyngitis/tonsilitis with multiple septic pulmonary embolization (Lemierre syndrome) -consult ID -UptoDate rec anticoagulation only in setting of extensive phlebitis -cont prn Oxycodone /  IV MSO4/IV Toradol (with Pepcid)  Microaerophilic streptococci bacteremia - ID following -likely narrow anbx's soon -repeat blood cx's obtained 2/11   Hypokalemia -Replete prn -Magnesium level 1.5 so given IV bolus and today 1.9 -check phosphorus in am  Right elbow pain/right knee pain -There is point tenderness noted at the lateral aspect of the right elbow joint but no current erythema or appreciable effusion - there is concern of course that this joint could have been seeded with the patient's bacteremia - we will continue antibiotics as noted above - should the patient develop an effusion or erythema of the joint or should the pain not improve with ongoing antibiotic therapy we will obtain an MRI of the joint and consider Orthopedic consultation  ? UTI -> 100,000 colonies multiple morphotypes  Acute kidney injury -resolved after hydration   Dehydration with hyponatremia -Sodium increased with rehydration -positive 5700 cc so decreased IVFs KVO -oral intake improved -cont supplements with resource and/or boost  Lactic acidosis  -Secondary to low perfusion from sepsis - resolving  DVT prophylaxis: Subcutaneous heparin Code Status: Full Family Communication: No family at bedside Disposition Plan/Expected LOS: Transfer to floor  Consultants:  ID  Procedures: None  Antibiotics: Zithromax 2/9 >>> 2/10 Rocephin 2/9 >>> Clindamycin 2/10 >>>  HPI/Subjective: Still with pain in left neck and painful swallowing. Tol liquid supplements.  Objective: Blood pressure 129/90, pulse 124, temperature 99.2 F (37.3 C), temperature source Oral, resp. rate 26, height 5\' 9"  (1.753 m), weight 217 lb 2.5 oz (98.5 kg), last menstrual period 08/18/2013, SpO2 99.00%.  Intake/Output Summary (Last 24 hours) at 08/30/13 1311 Last data filed at 08/30/13 0800  Gross per 24 hour  Intake    730 ml  Output    900 ml  Net   -170 ml   Exam: General: No acute respiratory distress ENT:  remains tender left neck esp at jaw line angle just below pinna Lungs: Right basilar and mid field crackles with no wheeze Cardiovascular: Tachycardic but regular with no appreciable rub or murmur - no peripheral edema Abdomen: Nontender, nondistended, soft, bowel sounds positive, no rebound, no ascites, no appreciable mass Musculoskeletal: No significant cyanosis, clubbing of bilateral lower extremities-right elbow without erythema or effusion- right knee without reproducible pain on palpation and no erythema or effusion Neurological: Alert and oriented x 3, moves all extremities x 4 without focal neurological deficits, CN 2-12 intact  Scheduled Meds:  Scheduled Meds: . cefTRIAXone (ROCEPHIN)  IV  1 g Intravenous Q24H  . clindamycin (CLEOCIN) IV  900 mg Intravenous 3 times per day  . lactose free nutrition  237 mL Oral TID WC   Or  . feeding supplement (RESOURCE BREEZE)  1 Container Oral TID WC  . guaiFENesin  1,200 mg Oral BID  . heparin  5,000 Units Subcutaneous 3 times per day  . ketorolac  15 mg Intravenous 4 times per day  . levalbuterol  0.63 mg Nebulization BID  . pantoprazole  40 mg Oral Daily  . sodium chloride  3 mL Intravenous Q12H   Continuous Infusions: . sodium chloride 20 mL/hr at 08/29/13 1051    Data Reviewed: Basic Metabolic Panel:  Recent Labs Lab 08/26/13 0315 08/27/13 0240 08/28/13 0230 08/29/13 0251 08/30/13 0433  NA 136* 133* 132* 133* 132*  K 3.7 3.6* 3.4* 3.3* 4.1  CL 102 103 101 101 100  CO2 18* 17* 17* 18* 20  GLUCOSE 134* 116* 127* 122* 97  BUN 32* 20 12 9 7   CREATININE 1.14* 0.85 0.70 0.65 0.68  CALCIUM 8.1* 7.7* 7.7* 7.4* 7.8*  MG  --   --   --  1.5 1.9  PHOS  --   --   --   --  3.2   Liver Function Tests:  Recent Labs Lab 08/25/13 1140 08/26/13 0315  AST 127* 71*  ALT 119* 100*  ALKPHOS 176* 159*  BILITOT 3.0* 2.5*  PROT 7.3 7.1  ALBUMIN 2.3* 2.0*   No results found for this basename: LIPASE, AMYLASE,  in the last 168  hours No results found for this basename: AMMONIA,  in the last 168 hours  CBC:  Recent Labs Lab 08/26/13 0315 08/27/13 0240 08/28/13 0230 08/29/13 0251 08/30/13 0433  WBC 35.7* 35.1* 33.5* 38.0* 30.0*  HGB 10.3* 9.6* 8.2* 7.8* 7.6*  HCT 28.1* 26.0* 22.6* 21.4* 20.8*  MCV 78.3 78.1 77.9* 78.4 79.7  PLT 136* 169 200 247 318    Recent Results (from the past 240 hour(s))  RAPID STREP SCREEN     Status: None   Collection Time    08/25/13 12:28 PM      Result Value Ref Range Status   Streptococcus, Group A Screen (Direct) NEGATIVE  NEGATIVE Final   Comment: (NOTE)     A Rapid Antigen test may result negative if the antigen level in the     sample is below the detection level of this test. The FDA has not     cleared this test as a stand-alone test therefore the rapid antigen     negative result has reflexed to a Group A Strep culture.  CULTURE, GROUP A STREP     Status: None   Collection Time    08/25/13 12:28 PM      Result  Value Ref Range Status   Specimen Description THROAT   Final   Special Requests NONE   Final   Culture     Final   Value: No Beta Hemolytic Streptococci Isolated     Performed at [facility]   Report Status 08/27/2013 FINAL   Final  URINE CULTURE     Status: None   Collection Time    08/25/13 12:28 PM      Result Value Ref Range Status   Specimen Description URINE, RANDOM   Final   Special Requests NONE   Final   Culture  Setup Time     Final   Value: 08/25/2013 18:01     Performed at [facility] Count     Final   Value: >=100,000 COLONIES/ML     Performed at [facility]   Culture     Final   Value: Multiple bacterial morphotypes present, none predominant. Suggest appropriate recollection if clinically indicated.     Performed at [facility]   Report Status 08/26/2013 FINAL   Final  MRSA PCR SCREENING     Status: None   Collection Time    08/25/13  3:52 PM      Result Value Ref Range Status   MRSA  by PCR NEGATIVE  NEGATIVE Final   Comment:            The GeneXpert MRSA Assay (FDA     approved for NASAL specimens     only), is one component of a     comprehensive MRSA colonization     surveillance program. It is not     intended to diagnose MRSA     infection nor to guide or     monitor treatment for     MRSA infections.  CULTURE, BLOOD (ROUTINE X 2)     Status: None   Collection Time    08/25/13  4:04 PM      Result Value Ref Range Status   Specimen Description BLOOD RIGHT HAND   Final   Special Requests BOTTLES DRAWN AEROBIC AND ANAEROBIC 3CC   Final   Culture  Setup Time     Final   Value: 08/25/2013 22:01     Performed at [facility]   Culture     Final   Value: MICROAEROPHILIC STREPTOCOCCI     Note: Standardized susceptibility testing for this organism is not available.     Note: Gram Stain Report Called to,Read Back By and Verified With: HEATHER RICHARD ON 08/26/2013 AT 9:48P BY Serafina Mitchell     Performed at [facility]   Report Status 08/29/2013 FINAL   Final  CULTURE, BLOOD (ROUTINE X 2)     Status: None   Collection Time    08/25/13  4:09 PM      Result Value Ref Range Status   Specimen Description BLOOD LEFT HAND   Final   Special Requests BOTTLES DRAWN AEROBIC ONLY 4CC   Final   Culture  Setup Time     Final   Value: 08/25/2013 21:59     Performed at [facility]   Culture     Final   Value: MICROAEROPHILIC STREPTOCOCCI     Note: Standardized susceptibility testing for this organism is not available.     Note: Gram Stain Report Called to,Read Back By and Verified With: Cindra Eves [id number] BRMEL     Performed at [facility]  Report Status 08/29/2013 FINAL   Final  CULTURE, BLOOD (ROUTINE X 2)     Status: None   Collection Time    08/28/13  8:05 AM      Result Value Ref Range Status   Specimen Description BLOOD LEFT HAND   Final   Special Requests BOTTLES DRAWN AEROBIC ONLY 10CC   Final   Culture  Setup Time     Final    Value: 08/28/2013 14:27     [facility]   Culture     Final   Value:        BLOOD CULTURE RECEIVED NO GROWTH TO DATE CULTURE WILL BE HELD FOR 5 DAYS BEFORE ISSUING A FINAL NEGATIVE REPORT     [facility]   Report Status PENDING   Incomplete  CULTURE, BLOOD (ROUTINE X 2)     Status: None   Collection Time    08/28/13  8:10 AM      Result Value Ref Range Status   Specimen Description BLOOD RIGHT HAND   Final   Special Requests BOTTLES DRAWN AEROBIC AND ANAEROBIC 10CC   Final   Culture  Setup Time     Final   Value: 08/28/2013 14:27     [facility]   Culture     Final   Value:        BLOOD CULTURE RECEIVED NO GROWTH TO DATE CULTURE WILL BE HELD FOR 5 DAYS BEFORE ISSUING A FINAL NEGATIVE REPORT     [facility]formed at [facility]   Report Status PENDING   Incomplete     Studies:  Recent x-ray studies have been reviewed in detail by the Attending Physician  Time spent : 35 minutes     Junious Silkllison Ellis, ANP [facility] Office  [phone] Pager [phone]  **If unable to reach the above provider after paging please contact the Flow Manager @ [phone]  On-Call/Text Page:      [email or web address]      password [email or web address]  If 7PM-7AM, please contact night-coverage [email or web address] Password [email or web address] 08/30/2013, 1:11 PM   LOS: 5 days    I have examined the patient, reviewed the chart and modified the above note which I agree with.   Delorse Shane,MD [phone] 08/30/2013, 4:03 PM

## 2013-08-30 NOTE — Progress Notes (Signed)
Patient ID: Nicole Alexander, female   DOB: 1976/06/26, 38 y.o.   MRN: [id number]         [facility]    Date of Admission:  08/25/2013           Day 6 ceftriaxone        Day 3 clindamycin Principal Problem:   Lemierre syndrome Active Problems:   Elbow pain, right   Gram-positive bacteremia   Sepsis   CAP (community acquired pneumonia)   AKI (acute kidney injury)   Dehydration with hyponatremia   High anion gap metabolic acidosis   Leukocytosis   UTI (urinary tract infection)   Neck pain on left side   . cefTRIAXone (ROCEPHIN)  IV  1 g Intravenous Q24H  . clindamycin (CLEOCIN) IV  900 mg Intravenous 3 times per day  . lactose free nutrition  237 mL Oral TID WC   Or  . feeding supplement (RESOURCE BREEZE)  1 Container Oral TID WC  . guaiFENesin  1,200 mg Oral BID  . heparin  5,000 Units Subcutaneous 3 times per day  . ketorolac  15 mg Intravenous 4 times per day  . levalbuterol  0.63 mg Nebulization BID  . pantoprazole  40 mg Oral Daily  . sodium chloride  3 mL Intravenous Q12H    Subjective: She is feeling a little bit better today.  History reviewed. No pertinent past medical history.  History  Substance Use Topics  . Smoking status: Former Smoker -- 0.50 packs/day  . Smokeless tobacco: Never Used  . Alcohol Use: Yes     Comment: occasional    Family History  Problem Relation Age of Onset  . Heart disease Mother     PTCA/stent  . Hypertension Mother   . Diabetes Mother   . Hyperlipidemia Mother   . Cancer Father     lung  . Hypertension Father   . Diabetes Father   . Hyperlipidemia Father   . Hypertension Brother   . Cancer Maternal Grandfather     bone    Allergies  Allergen Reactions  . Penicillins Swelling    "swole up like a balloon"    Objective: Temp:  [97.5 F (36.4 C)-99.3 F (37.4 C)] 98.4 F (36.9 C) (02/13 0400) Pulse Rate:  [103-124] 124 (02/13 0000) Resp:  [25-38] 26 (02/13 0000) BP:  (122-132)/(83-96) 129/90 mmHg (02/13 0750) SpO2:  [98 %-100 %] 99 % (02/13 0845)  General: She is sitting up Neck: Still tender the left side without obvious swelling Lungs: Clear Cor: Regular S1 and S2 with no murmurs  Lab Results Lab Results  Component Value Date   WBC 30.0* 08/30/2013   HGB 7.6* 08/30/2013   HCT 20.8* 08/30/2013   MCV 79.7 08/30/2013   PLT 318 08/30/2013    Lab Results  Component Value Date   CREATININE 0.68 08/30/2013   BUN 7 08/30/2013   NA 132* 08/30/2013   K 4.1 08/30/2013   CL 100 08/30/2013   CO2 20 08/30/2013    Lab Results  Component Value Date   ALT 100* 08/26/2013   AST 71* 08/26/2013   ALKPHOS 159* 08/26/2013   BILITOT 2.5* 08/26/2013      Microbiology: Recent Results (from the past 240 hour(s))  RAPID STREP SCREEN     Status: None   Collection Time    08/25/13 12:28 PM      Result Value Ref Range Status   Streptococcus, Group A Screen (Direct) NEGATIVE  NEGATIVE  Final   Comment: (NOTE)     A Rapid Antigen test may result negative if the antigen level in the     sample is below the detection level of this test. The FDA has not     cleared this test as a stand-alone test therefore the rapid antigen     negative result has reflexed to a Group A Strep culture.  CULTURE, GROUP A STREP     Status: None   Collection Time    08/25/13 12:28 PM      Result Value Ref Range Status   Specimen Description THROAT   Final   Special Requests NONE   Final   Culture     Final   Value: No Beta Hemolytic Streptococci Isolated     Performed at [facility]   Report Status 08/27/2013 FINAL   Final  URINE CULTURE     Status: None   Collection Time    08/25/13 12:28 PM      Result Value Ref Range Status   Specimen Description URINE, RANDOM   Final   Special Requests NONE   Final   Culture  Setup Time     Final   Value: 08/25/2013 18:01     Performed at [facility]     Final   Value: >=100,000 COLONIES/ML     Performed at [facility]   Culture     Final   Value: Multiple bacterial morphotypes present, none predominant. Suggest appropriate recollection if clinically indicated.     Performed at [facility]   Report Status 08/26/2013 FINAL   Final  MRSA PCR SCREENING     Status: None   Collection Time    08/25/13  3:52 PM      Result Value Ref Range Status   MRSA by PCR NEGATIVE  NEGATIVE Final   Comment:            The GeneXpert MRSA Assay (FDA     approved for NASAL specimens     only), is one component of a     comprehensive MRSA colonization     surveillance program. It is not     intended to diagnose MRSA     infection nor to guide or     monitor treatment for     MRSA infections.  CULTURE, BLOOD (ROUTINE X 2)     Status: None   Collection Time    08/25/13  4:04 PM      Result Value Ref Range Status   Specimen Description BLOOD RIGHT HAND   Final   Special Requests BOTTLES DRAWN AEROBIC AND ANAEROBIC 3CC   Final   Culture  Setup Time     Final   Value: 08/25/2013 22:01     Performed at [facility]   Culture     Final   Value: MICROAEROPHILIC STREPTOCOCCI     Note: Standardized susceptibility testing for this organism is not available.     Note: Gram Stain Report Called to,Read Back By and Verified With: HEATHER RICHARD ON 08/26/2013 AT 9:48P BY Dennard Nip     Performed at [facility]   Report Status 08/29/2013 FINAL   Final  CULTURE, BLOOD (ROUTINE X 2)     Status: None   Collection Time    08/25/13  4:09 PM      Result Value Ref Range Status   Specimen Description BLOOD LEFT HAND   Final  Special Requests BOTTLES DRAWN AEROBIC ONLY 4CC   Final   Culture  Setup Time     Final   Value: 08/25/2013 21:59     Performed at [facility]   Culture     Final   Value: MICROAEROPHILIC STREPTOCOCCI     Note: Standardized susceptibility testing for this organism is not available.     Note: Gram Stain Report Called to,Read Back By and Verified With: Sharlyne Pacas  [id number] BRMEL     Performed at [facility]   Report Status 08/29/2013 FINAL   Final  CULTURE, BLOOD (ROUTINE X 2)     Status: None   Collection Time    08/28/13  8:05 AM      Result Value Ref Range Status   Specimen Description BLOOD LEFT HAND   Final   Special Requests BOTTLES DRAWN AEROBIC ONLY 10CC   Final   Culture  Setup Time     Final   Value: 08/28/2013 14:27     Performed at [facility]   Culture     Final   Value:        BLOOD CULTURE RECEIVED NO GROWTH TO DATE CULTURE WILL BE HELD FOR 5 DAYS BEFORE ISSUING A FINAL NEGATIVE REPORT     Performed at [facility]   Report Status PENDING   Incomplete  CULTURE, BLOOD (ROUTINE X 2)     Status: None   Collection Time    08/28/13  8:10 AM      Result Value Ref Range Status   Specimen Description BLOOD RIGHT HAND   Final   Special Requests BOTTLES DRAWN AEROBIC AND ANAEROBIC 10CC   Final   Culture  Setup Time     Final   Value: 08/28/2013 14:27     Performed at [facility]   Culture     Final   Value:        BLOOD CULTURE RECEIVED NO GROWTH TO DATE CULTURE WILL BE HELD FOR 5 DAYS BEFORE ISSUING A FINAL NEGATIVE REPORT     Performed at [facility]   Report Status PENDING   Incomplete    Studies/Results: No results found.  Assessment: Her fever is down. Her white count is decreasing and she seems to be a little bit better today. Repeat blood cultures are negative. I will continue current antibiotics for now will consider dropping clindamycin soon.  Plan: 1. Continue current antibiotics  Michel Bickers, MD [facility] [place] [phone] pager   [phone] cell 08/30/2013, 11:27 AM

## 2013-08-31 LAB — CBC
HCT: 22.5 % — ABNORMAL LOW (ref 36.0–46.0)
Hemoglobin: 7.6 g/dL — ABNORMAL LOW (ref 12.0–15.0)
MCH: 27 pg (ref 26.0–34.0)
MCHC: 33.8 g/dL (ref 30.0–36.0)
MCV: 80.1 fL (ref 78.0–100.0)
PLATELETS: 372 10*3/uL (ref 150–400)
RBC: 2.81 MIL/uL — ABNORMAL LOW (ref 3.87–5.11)
RDW: 13.8 % (ref 11.5–15.5)
WBC: 27.7 10*3/uL — AB (ref 4.0–10.5)

## 2013-08-31 MED ORDER — NYSTATIN 100000 UNIT/ML MT SUSP
5.0000 mL | Freq: Four times a day (QID) | OROMUCOSAL | Status: DC
  Administered 2013-08-31 – 2013-09-04 (×16): 500000 [IU] via ORAL
  Filled 2013-08-31 (×19): qty 5

## 2013-08-31 NOTE — Progress Notes (Signed)
Patient ID: Nicole Alexander, female   DOB: 03/20/1976, 38 y.o.   MRN: [id number]         [facility]    Date of Admission:  08/25/2013            Day 7 ceftriaxone        Day 4 clindamycin Principal Problem:   Lemierre syndrome Active Problems:   Elbow pain, right   Gram-positive bacteremia   Sepsis   CAP (community acquired pneumonia)   AKI (acute kidney injury)   Dehydration with hyponatremia   High anion gap metabolic acidosis   Leukocytosis   UTI (urinary tract infection)   Neck pain on left side   . cefTRIAXone (ROCEPHIN)  IV  1 g Intravenous Q24H  . clindamycin (CLEOCIN) IV  900 mg Intravenous 3 times per day  . lactose free nutrition  237 mL Oral TID WC   Or  . feeding supplement (RESOURCE BREEZE)  1 Container Oral TID WC  . guaiFENesin  1,200 mg Oral BID  . heparin  5,000 Units Subcutaneous 3 times per day  . ketorolac  15 mg Intravenous 4 times per day  . nystatin  5 mL Oral QID  . pantoprazole  40 mg Oral Daily  . sodium chloride  3 mL Intravenous Q12H    Subjective: She still has some left-sided throat and neck pain but she is feeling better. She was able to eat some of her chicken sandwich today. She stood at the sink to bathe herself and has been walking in her room.  History reviewed. No pertinent past medical history.  History  Substance Use Topics  . Smoking status: Former Smoker -- 0.50 packs/day  . Smokeless tobacco: Never Used  . Alcohol Use: Yes     Comment: occasional    Family History  Problem Relation Age of Onset  . Heart disease Mother     PTCA/stent  . Hypertension Mother   . Diabetes Mother   . Hyperlipidemia Mother   . Cancer Father     lung  . Hypertension Father   . Diabetes Father   . Hyperlipidemia Father   . Hypertension Brother   . Cancer Maternal Grandfather     bone    Allergies  Allergen Reactions  . Penicillins Swelling    "swole up like a balloon"    Objective: Temp:  [98.6  F (37 C)-99.7 F (37.6 C)] 98.9 F (37.2 C) (02/14 0913) Pulse Rate:  [84-130] 130 (02/14 0913) Resp:  [18-24] 23 (02/14 0913) BP: (114-147)/(71-98) 146/80 mmHg (02/14 0913) SpO2:  [94 %-99 %] 99 % (02/14 0913)  General: She is more alert and smiling Skin: No rash Oral: Coated tongue but no thrush Neck: Stool tender on left side Lungs: Clear Cor: Tachycardic but regular S1 and S2 with no murmur Joints and extremities: Good range of motion of the right elbow and right knee without clear evidence of infection  Lab Results Lab Results  Component Value Date   WBC 27.7* 08/31/2013   HGB 7.6* 08/31/2013   HCT 22.5* 08/31/2013   MCV 80.1 08/31/2013   PLT 372 08/31/2013    Lab Results  Component Value Date   CREATININE 0.68 08/30/2013   BUN 7 08/30/2013   NA 132* 08/30/2013   K 4.1 08/30/2013   CL 100 08/30/2013   CO2 20 08/30/2013    Lab Results  Component Value Date   ALT 100* 08/26/2013   AST 71* 08/26/2013  ALKPHOS 159* 08/26/2013   BILITOT 2.5* 08/26/2013      Microbiology: Recent Results (from the past 240 hour(s))  RAPID STREP SCREEN     Status: None   Collection Time    08/25/13 12:28 PM      Result Value Ref Range Status   Streptococcus, Group A Screen (Direct) NEGATIVE  NEGATIVE Final   Comment: (NOTE)     A Rapid Antigen test may result negative if the antigen level in the     sample is below the detection level of this test. The FDA has not     cleared this test as a stand-alone test therefore the rapid antigen     negative result has reflexed to a Group A Strep culture.  CULTURE, GROUP A STREP     Status: None   Collection Time    08/25/13 12:28 PM      Result Value Ref Range Status   Specimen Description THROAT   Final   Special Requests NONE   Final   Culture     Final   Value: No Beta Hemolytic Streptococci Isolated     Performed at [facility]   Report Status 08/27/2013 FINAL   Final  URINE CULTURE     Status: None   Collection Time    08/25/13  12:28 PM      Result Value Ref Range Status   Specimen Description URINE, RANDOM   Final   Special Requests NONE   Final   Culture  Setup Time     Final   Value: 08/25/2013 18:01     Performed at [facility]     Final   Value: >=100,000 COLONIES/ML     Performed at [facility]   Culture     Final   Value: Multiple bacterial morphotypes present, none predominant. Suggest appropriate recollection if clinically indicated.     Performed at [facility]   Report Status 08/26/2013 FINAL   Final  MRSA PCR SCREENING     Status: None   Collection Time    08/25/13  3:52 PM      Result Value Ref Range Status   MRSA by PCR NEGATIVE  NEGATIVE Final   Comment:            The GeneXpert MRSA Assay (FDA     approved for NASAL specimens     only), is one component of a     comprehensive MRSA colonization     surveillance program. It is not     intended to diagnose MRSA     infection nor to guide or     monitor treatment for     MRSA infections.  CULTURE, BLOOD (ROUTINE X 2)     Status: None   Collection Time    08/25/13  4:04 PM      Result Value Ref Range Status   Specimen Description BLOOD RIGHT HAND   Final   Special Requests BOTTLES DRAWN AEROBIC AND ANAEROBIC 3CC   Final   Culture  Setup Time     Final   Value: 08/25/2013 22:01     Performed at [facility]   Culture     Final   Value: MICROAEROPHILIC STREPTOCOCCI     Note: Standardized susceptibility testing for this organism is not available.     Note: Gram Stain Report Called to,Read Back By and Verified With: HEATHER RICHARD ON 08/26/2013 AT 9:48P BY Dennard Nip  [facility]   Report Status 08/29/2013 FINAL   Final  CULTURE, BLOOD (ROUTINE X 2)     Status: None   Collection Time    08/25/13  4:09 PM      Result Value Ref Range Status   Specimen Description BLOOD LEFT HAND   Final   Special Requests BOTTLES DRAWN AEROBIC ONLY 4CC   Final   Culture  Setup Time      Final   Value: 08/25/2013 21:59     Performed at [facility]   Culture     Final   Value: MICROAEROPHILIC STREPTOCOCCI     Note: Standardized susceptibility testing for this organism is not available.     Note: Gram Stain Report Called to,Read Back By and Verified With: Cindra EvesHEATHER RICHARD [id number] BRMEL     Performed at [facility]   Report Status 08/29/2013 FINAL   Final  CULTURE, BLOOD (ROUTINE X 2)     Status: None   Collection Time    08/28/13  8:05 AM      Result Value Ref Range Status   Specimen Description BLOOD LEFT HAND   Final   Special Requests BOTTLES DRAWN AEROBIC ONLY 10CC   Final   Culture  Setup Time     Final   Value: 08/28/2013 14:27     Performed at [facility]   Culture     Final   Value:        BLOOD CULTURE RECEIVED NO GROWTH TO DATE CULTURE WILL BE HELD FOR 5 DAYS BEFORE ISSUING A FINAL NEGATIVE REPORT     Performed at [facility]   Report Status PENDING   Incomplete  CULTURE, BLOOD (ROUTINE X 2)     Status: None   Collection Time    08/28/13  8:10 AM      Result Value Ref Range Status   Specimen Description BLOOD RIGHT HAND   Final   Special Requests BOTTLES DRAWN AEROBIC AND ANAEROBIC 10CC   Final   Culture  Setup Time     Final   Value: 08/28/2013 14:27     Performed at [facility]   Culture     Final   Value:        BLOOD CULTURE RECEIVED NO GROWTH TO DATE CULTURE WILL BE HELD FOR 5 DAYS BEFORE ISSUING A FINAL NEGATIVE REPORT     Performed at [facility]   Report Status PENDING   Incomplete   Assessment: She is improving on therapy for tonsillitis, streptococcal bacteremia and Lemierre syndrome.  Plan: 1. Continue ceftriaxone 2. Discontinue clindamycin 3. I will followup on Monday, February 16  Cliffton AstersJohn Zaydn Gutridge, MD [facility] [phone] pager   [phone] cell 08/31/2013, 2:07 PM

## 2013-08-31 NOTE — Progress Notes (Signed)
Progress Note  Nicole Alexander PJS:[id number] DOB: 09-06-1975 DOA: 08/25/2013 PCP: Cain Saupe, MD  Brief narrative: 38 year old female patient with no significant past medical history. Endorsed cough and shortness of breath for one week. Was initially treated at urgent care and was prescribed a prednisone taper but did not see any improvement in her symptoms. Her symptoms actually worsened to the point she could not even walk to the bathroom without significant dyspnea. She also noticed fevers 3 days prior to her presentation to [facility]. Upon arrival to the emergency department patient was not hypoxic but had significant tachypnea and was tachycardic with a heart rate of 142. She was given fluid challenges. In addition her sodium was 128 with potassium of 2.5 and a bicarbonate of 17. Her BUN was 43 and creatinine was 1.9 with a lactic acid of 3.6 and white count of 27,000. Chest x-ray revealed infiltrate right middle lung.  Assessment/Plan:  Sepsis -due to pharyngitis/tonsillitis with bacteremia -Sepsis physiology is improving but has not yet resolved (remains tachy, WBC markedly elevated) -Continue supportive care -Not hypotensive so discontinued stress dose steroids 2/9 -urinary strep and legionella negative  Acute respiratory failure due to septic emboli -remains on RA -suspect infiltrate seen on CXR not PNA but is emboli with likely surrounding infarct -Continue antibiotics and supportive care -Influenza negative -urinary strep and legionella neg -WBC trend down finally -consider repeating CT scan and consult ENT if patient worsens  Left neck pain/swelling 2/2 septic thrombophlebitis - due to septic thrombophlebitis (non occlusive) of the IJ in setting of acute pharyngitis/tonsilitis with multiple septic pulmonary embolization (Lemierre syndrome) -consult ID -UptoDate rec anticoagulation only in setting of extensive phlebitis -cont prn Oxycodone / IV MSO4/IV  Toradol (with Pepcid)  Microaerophilic streptococci bacteremia - ID following -likely narrow anbx's soon -repeat blood cx's obtained 2/11   Hypokalemia -Replete prn   Right elbow pain/right knee pain -There is point tenderness noted at the lateral aspect of the right elbow joint but no current erythema or appreciable effusion - there is concern of course that this joint could have been seeded with the patient's bacteremia - we will continue antibiotics as noted above - should the patient develop an effusion or erythema of the joint or should the pain not improve with ongoing antibiotic therapy we will obtain an MRI of the joint and consider Orthopedic consultation  ? UTI -> 100,000 colonies multiple morphotypes  Acute kidney injury -resolved after hydration   Dehydration with hyponatremia -Sodium increased with rehydration -positive 5700 cc so decreased IVFs KVO -oral intake improved -cont supplements with resource and/or boost  Lactic acidosis  -Secondary to low perfusion from sepsis - resolving  DVT prophylaxis: Subcutaneous heparin Code Status: Full Family Communication: No family at bedside Disposition Plan/Expected LOS: 3-4 more days  Consultants:  ID  Procedures: None  Antibiotics: Zithromax 2/9 >>> 2/10 Rocephin 2/9 >>> Clindamycin 2/10 >>>  HPI/Subjective: Able to swallow but having pain No SOB  Objective: Blood pressure 146/80, pulse 130, temperature 98.9 F (37.2 C), temperature source Oral, resp. rate 23, height 5\' 9"  (1.753 m), weight 98.5 kg (217 lb 2.5 oz), last menstrual period 08/18/2013, SpO2 99.00%.  Intake/Output Summary (Last 24 hours) at 08/31/13 1023 Last data filed at 08/31/13 0900  Gross per 24 hour  Intake    582 ml  Output      0 ml  Net    582 ml   Exam: General: No acute respiratory distress ENT: remains tender  left neck esp at jaw line angle just below pinna Lungs: Right basilar and mid field crackles with no  wheeze Cardiovascular: Tachycardic but regular with no appreciable rub or murmur - no peripheral edema Abdomen: Nontender, nondistended, soft, bowel sounds positive, no rebound, no ascites, no appreciable mass Musculoskeletal: No significant cyanosis, clubbing of bilateral lower extremities-right elbow without erythema or effusion- right knee without reproducible pain on palpation and no erythema or effusion Neurological: Alert and oriented x 3, moves all extremities x 4 without focal neurological deficits, CN 2-12 intact  Scheduled Meds:  Scheduled Meds: . cefTRIAXone (ROCEPHIN)  IV  1 g Intravenous Q24H  . clindamycin (CLEOCIN) IV  900 mg Intravenous 3 times per day  . lactose free nutrition  237 mL Oral TID WC   Or  . feeding supplement (RESOURCE BREEZE)  1 Container Oral TID WC  . guaiFENesin  1,200 mg Oral BID  . heparin  5,000 Units Subcutaneous 3 times per day  . ketorolac  15 mg Intravenous 4 times per day  . pantoprazole  40 mg Oral Daily  . sodium chloride  3 mL Intravenous Q12H   Continuous Infusions: . sodium chloride 20 mL/hr at 08/29/13 1051    Data Reviewed: Basic Metabolic Panel:  Recent Labs Lab 08/26/13 0315 08/27/13 0240 08/28/13 0230 08/29/13 0251 08/30/13 0433  NA 136* 133* 132* 133* 132*  K 3.7 3.6* 3.4* 3.3* 4.1  CL 102 103 101 101 100  CO2 18* 17* 17* 18* 20  GLUCOSE 134* 116* 127* 122* 97  BUN 32* 20 12 9 7   CREATININE 1.14* 0.85 0.70 0.65 0.68  CALCIUM 8.1* 7.7* 7.7* 7.4* 7.8*  MG  --   --   --  1.5 1.9  PHOS  --   --   --   --  3.2   Liver Function Tests:  Recent Labs Lab 08/25/13 1140 08/26/13 0315  AST 127* 71*  ALT 119* 100*  ALKPHOS 176* 159*  BILITOT 3.0* 2.5*  PROT 7.3 7.1  ALBUMIN 2.3* 2.0*   No results found for this basename: LIPASE, AMYLASE,  in the last 168 hours No results found for this basename: AMMONIA,  in the last 168 hours  CBC:  Recent Labs Lab 08/27/13 0240 08/28/13 0230 08/29/13 0251 08/30/13 0433  08/31/13 0400  WBC 35.1* 33.5* 38.0* 30.0* 27.7*  HGB 9.6* 8.2* 7.8* 7.6* 7.6*  HCT 26.0* 22.6* 21.4* 20.8* 22.5*  MCV 78.1 77.9* 78.4 79.7 80.1  PLT 169 200 247 318 372    Recent Results (from the past 240 hour(s))  RAPID STREP SCREEN     Status: None   Collection Time    08/25/13 12:28 PM      Result Value Ref Range Status   Streptococcus, Group A Screen (Direct) NEGATIVE  NEGATIVE Final   Comment: (NOTE)     A Rapid Antigen test may result negative if the antigen level in the     sample is below the detection level of this test. The FDA has not     cleared this test as a stand-alone test therefore the rapid antigen     negative result has reflexed to a Group A Strep culture.  CULTURE, GROUP A STREP     Status: None   Collection Time    08/25/13 12:28 PM      Result Value Ref Range Status   Specimen Description THROAT   Final   Special Requests NONE   Final   Culture  Final   Value: No Beta Hemolytic Streptococci Isolated     Performed at [facility]   Report Status 08/27/2013 FINAL   Final  URINE CULTURE     Status: None   Collection Time    08/25/13 12:28 PM      Result Value Ref Range Status   Specimen Description URINE, RANDOM   Final   Special Requests NONE   Final   Culture  Setup Time     Final   Value: 08/25/2013 18:01     Performed at [place]     Final   Value: >=100,000 COLONIES/ML     Performed at [facility]   Culture     Final   Value: Multiple bacterial morphotypes present, none predominant. Suggest appropriate recollection if clinically indicated.     Performed at [facility]   Report Status 08/26/2013 FINAL   Final  MRSA PCR SCREENING     Status: None   Collection Time    08/25/13  3:52 PM      Result Value Ref Range Status   MRSA by PCR NEGATIVE  NEGATIVE Final   Comment:            The GeneXpert MRSA Assay (FDA     approved for NASAL specimens     only), is one component of a      comprehensive MRSA colonization     surveillance program. It is not     intended to diagnose MRSA     infection nor to guide or     monitor treatment for     MRSA infections.  CULTURE, BLOOD (ROUTINE X 2)     Status: None   Collection Time    08/25/13  4:04 PM      Result Value Ref Range Status   Specimen Description BLOOD RIGHT HAND   Final   Special Requests BOTTLES DRAWN AEROBIC AND ANAEROBIC 3CC   Final   Culture  Setup Time     Final   Value: 08/25/2013 22:01     Performed at [facility]   Culture     Final   Value: MICROAEROPHILIC STREPTOCOCCI     Note: Standardized susceptibility testing for this organism is not available.     Note: Gram Stain Report Called to,Read Back By and Verified With: HEATHER RICHARD ON 08/26/2013 AT 9:48P BY Dennard Nip     Performed at [facility]   Report Status 08/29/2013 FINAL   Final  CULTURE, BLOOD (ROUTINE X 2)     Status: None   Collection Time    08/25/13  4:09 PM      Result Value Ref Range Status   Specimen Description BLOOD LEFT HAND   Final   Special Requests BOTTLES DRAWN AEROBIC ONLY 4CC   Final   Culture  Setup Time     Final   Value: 08/25/2013 21:59     Performed at [facility]   Culture     Final   Value: MICROAEROPHILIC STREPTOCOCCI     Note: Standardized susceptibility testing for this organism is not available.     Note: Gram Stain Report Called to,Read Back By and Verified With: Sharlyne Pacas [id number] BRMEL     Performed at [facility]   Report Status 08/29/2013 FINAL   Final  CULTURE, BLOOD (ROUTINE X 2)     Status: None   Collection Time    08/28/13  8:05 AM      Result Value Ref Range Status   Specimen Description BLOOD LEFT HAND   Final   Special Requests BOTTLES DRAWN AEROBIC ONLY 10CC   Final   Culture  Setup Time     Final   Value: 08/28/2013 14:27     Performed at [facility]   Culture     Final   Value:        BLOOD CULTURE RECEIVED NO GROWTH TO DATE CULTURE WILL  BE HELD FOR 5 DAYS BEFORE ISSUING A FINAL NEGATIVE REPORT     Performed at [facility]   Report Status PENDING   Incomplete  CULTURE, BLOOD (ROUTINE X 2)     Status: None   Collection Time    08/28/13  8:10 AM      Result Value Ref Range Status   Specimen Description BLOOD RIGHT HAND   Final   Special Requests BOTTLES DRAWN AEROBIC AND ANAEROBIC 10CC   Final   Culture  Setup Time     Final   Value: 08/28/2013 14:27     Performed at [facility]   Culture     Final   Value:        BLOOD CULTURE RECEIVED NO GROWTH TO DATE CULTURE WILL BE HELD FOR 5 DAYS BEFORE ISSUING A FINAL NEGATIVE REPORT     Performed at [facility]   Report Status PENDING   Incomplete       Time spent : 35 minutes     Eulogio Bear DO [facility] Office  [phone] Pager [phone]  **If unable to reach the above provider after paging please contact the [place] @ [phone]  On-Call/Text Page:      [email or web address]      password [email or web address]  If 7PM-7AM, please contact night-coverage [email or web address] Password [email or web address] 08/31/2013, 10:23 AM   LOS: 6 days

## 2013-09-01 DIAGNOSIS — M25529 Pain in unspecified elbow: Secondary | ICD-10-CM

## 2013-09-01 LAB — CBC
HCT: 18.9 % — ABNORMAL LOW (ref 36.0–46.0)
HEMOGLOBIN: 6.7 g/dL — AB (ref 12.0–15.0)
MCH: 28.9 pg (ref 26.0–34.0)
MCHC: 35.4 g/dL (ref 30.0–36.0)
MCV: 81.5 fL (ref 78.0–100.0)
Platelets: 485 10*3/uL — ABNORMAL HIGH (ref 150–400)
RBC: 2.32 MIL/uL — AB (ref 3.87–5.11)
RDW: 13.8 % (ref 11.5–15.5)
WBC: 29.1 10*3/uL — AB (ref 4.0–10.5)

## 2013-09-01 LAB — BASIC METABOLIC PANEL
BUN: 4 mg/dL — ABNORMAL LOW (ref 6–23)
CHLORIDE: 102 meq/L (ref 96–112)
CO2: 22 meq/L (ref 19–32)
Calcium: 8.4 mg/dL (ref 8.4–10.5)
Creatinine, Ser: 0.68 mg/dL (ref 0.50–1.10)
GFR calc Af Amer: >90 mL/min (ref >90)
GFR calc non Af Amer: >90 mL/min (ref >90)
Glucose, Bld: 127 mg/dL — ABNORMAL HIGH (ref 70–99)
Potassium: 4.1 mEq/L (ref 3.7–5.3)
Sodium: 136 mEq/L — ABNORMAL LOW (ref 137–147)

## 2013-09-01 NOTE — Progress Notes (Signed)
CRITICAL VALUE ALERT  Critical value received:  hgb 6.7  Date of notification:  09/01/13  Time of notification:  19:56  Critical value read back: yes  Nurse who received alert:  Doreene Burke  MD notified (1st page):  Alfonso Patten Reidler, PA  Time of first page:  19:59  MD notified (2nd page):  Time of second page:  Responding MD:  Koleen Nimrod, PA  Time MD responded:  20:13

## 2013-09-01 NOTE — Progress Notes (Signed)
09/01/13 1200 nsg Pt PIV outdated. IV team tried 2x but unable to get PIV. Per patient she is a difficult stick. Dr. Eliseo Squires notified. New orders noted. MD notified that lab was unable to draw blood as well done 2x. New orders noted.

## 2013-09-01 NOTE — Progress Notes (Signed)
Progress Note  Nicole Alexander [id number] DOB: 29-Mar-1976 DOA: 08/25/2013 PCP: Antony Blackbird, MD  Brief narrative: 38 year old female patient with no significant past medical history. Endorsed cough and shortness of breath for one week. Was initially treated at urgent care and was prescribed a prednisone taper but did not see any improvement in her symptoms. Her symptoms actually worsened to the point she could not even walk to the bathroom without significant dyspnea. She also noticed fevers 3 days prior to her presentation to [facility]. Upon arrival to the emergency department patient was not hypoxic but had significant tachypnea and was tachycardic with a heart rate of 142. She was given fluid challenges. In addition her sodium was 128 with potassium of 2.5 and a bicarbonate of 17. Her BUN was 43 and creatinine was 1.9 with a lactic acid of 3.6 and white count of 27,000. Chest x-ray revealed infiltrate right middle lung.  Assessment/Plan:  Sepsis -due to pharyngitis/tonsillitis with bacteremia -Sepsis physiology is improving but has not yet resolved (remains tachy, WBC markedly elevated) -Continue supportive care -Not hypotensive so discontinued stress dose steroids 2/9 -urinary strep and legionella negative  Acute respiratory failure due to septic emboli -remains on RA -suspect infiltrate seen on CXR not PNA but is emboli with likely surrounding infarct -Continue antibiotics and supportive care -Influenza negative -urinary strep and legionella neg -WBC trend down finally -consider repeating CT scan and consult ENT if patient worsens- CT scan of neck with contrast  Left neck pain/swelling 2/2 septic thrombophlebitis - due to septic thrombophlebitis (non occlusive) of the IJ in setting of acute pharyngitis/tonsilitis with multiple septic pulmonary embolization (Lemierre syndrome) -consult ID -UptoDate rec anticoagulation only in setting of extensive  phlebitis -cont prn Oxycodone / IV MSO4/IV Toradol (with Pepcid)  Microaerophilic streptococci bacteremia - ID following -repeat blood cx's obtained 2/11   Hypokalemia -Replete prn   Right elbow pain/right knee pain -There is point tenderness noted at the lateral aspect of the right elbow joint but no current erythema or appreciable effusion - there is concern of course that this joint could have been seeded with the patient's bacteremia - we will continue antibiotics as noted above - should the patient develop an effusion or erythema of the joint or should the pain not improve with ongoing antibiotic therapy we will obtain an MRI of the joint and consider Orthopedic consultation  ? UTI -> 100,000 colonies multiple morphotypes  Acute kidney injury -resolved after hydration   Dehydration with hyponatremia -Sodium increased with rehydration -positive 5700 cc so decreased IVFs KVO -oral intake improved -cont supplements with resource and/or boost  Lactic acidosis  -Secondary to low perfusion from sepsis - resolving  Thrush -nystatin   DVT prophylaxis: Subcutaneous heparin Code Status: Full Family Communication: No family at bedside Disposition Plan/Expected LOS: 3-4 more days  Consultants:  ID  Procedures: None  Antibiotics: Zithromax 2/9 >>> 2/10 Rocephin 2/9 >>> Clindamycin 2/10 >>>2/14  HPI/Subjective: Able to swallow but having pain Mouth feeling better after nystatin added  Objective: Blood pressure 146/92, pulse 122, temperature 98.7 F (37.1 C), temperature source Oral, resp. rate 20, height 5\' 9"  (1.753 m), weight 98.5 kg (217 lb 2.5 oz), last menstrual period 08/18/2013, SpO2 93.00%.  Intake/Output Summary (Last 24 hours) at 09/01/13 1006 Last data filed at 09/01/13 0700  Gross per 24 hour  Intake 1894.67 ml  Output      0 ml  Net 1894.67 ml   Exam: General: No acute respiratory  distress ENT: remains tender left neck esp at jaw line angle just  below pinna Lungs: Right basilar and mid field crackles with no wheeze Cardiovascular: Tachycardic but regular with no appreciable rub or murmur - no peripheral edema Abdomen: Nontender, nondistended, soft, bowel sounds positive, no rebound, no ascites, no appreciable mass Musculoskeletal: No significant cyanosis, clubbing of bilateral lower extremities-right elbow without erythema or effusion- right knee without reproducible pain on palpation and no erythema or effusion Neurological: Alert and oriented x 3, moves all extremities x 4 without focal neurological deficits, CN 2-12 intact  Scheduled Meds:  Scheduled Meds: . cefTRIAXone (ROCEPHIN)  IV  1 g Intravenous Q24H  . lactose free nutrition  237 mL Oral TID WC   Or  . feeding supplement (RESOURCE BREEZE)  1 Container Oral TID WC  . guaiFENesin  1,200 mg Oral BID  . heparin  5,000 Units Subcutaneous 3 times per day  . ketorolac  15 mg Intravenous 4 times per day  . nystatin  5 mL Oral QID  . pantoprazole  40 mg Oral Daily  . sodium chloride  3 mL Intravenous Q12H   Continuous Infusions: . sodium chloride 20 mL/hr at 08/31/13 1900    Data Reviewed: Basic Metabolic Panel:  Recent Labs Lab 08/26/13 0315 08/27/13 0240 08/28/13 0230 08/29/13 0251 08/30/13 0433  NA 136* 133* 132* 133* 132*  K 3.7 3.6* 3.4* 3.3* 4.1  CL 102 103 101 101 100  CO2 18* 17* 17* 18* 20  GLUCOSE 134* 116* 127* 122* 97  BUN 32* 20 12 9 7   CREATININE 1.14* 0.85 0.70 0.65 0.68  CALCIUM 8.1* 7.7* 7.7* 7.4* 7.8*  MG  --   --   --  1.5 1.9  PHOS  --   --   --   --  3.2   Liver Function Tests:  Recent Labs Lab 08/25/13 1140 08/26/13 0315  AST 127* 71*  ALT 119* 100*  ALKPHOS 176* 159*  BILITOT 3.0* 2.5*  PROT 7.3 7.1  ALBUMIN 2.3* 2.0*   No results found for this basename: LIPASE, AMYLASE,  in the last 168 hours No results found for this basename: AMMONIA,  in the last 168 hours  CBC:  Recent Labs Lab 08/27/13 0240 08/28/13 0230  08/29/13 0251 08/30/13 0433 08/31/13 0400  WBC 35.1* 33.5* 38.0* 30.0* 27.7*  HGB 9.6* 8.2* 7.8* 7.6* 7.6*  HCT 26.0* 22.6* 21.4* 20.8* 22.5*  MCV 78.1 77.9* 78.4 79.7 80.1  PLT 169 200 247 318 372    Recent Results (from the past 240 hour(s))  RAPID STREP SCREEN     Status: None   Collection Time    08/25/13 12:28 PM      Result Value Ref Range Status   Streptococcus, Group A Screen (Direct) NEGATIVE  NEGATIVE Final   Comment: (NOTE)     A Rapid Antigen test may result negative if the antigen level in the     sample is below the detection level of this test. The FDA has not     cleared this test as a stand-alone test therefore the rapid antigen     negative result has reflexed to a Group A Strep culture.  CULTURE, GROUP A STREP     Status: None   Collection Time    08/25/13 12:28 PM      Result Value Ref Range Status   Specimen Description THROAT   Final   Special Requests NONE   Final   Culture  Final   Value: No Beta Hemolytic Streptococci Isolated     Performed at [facility]   Report Status 08/27/2013 FINAL   Final  URINE CULTURE     Status: None   Collection Time    08/25/13 12:28 PM      Result Value Ref Range Status   Specimen Description URINE, RANDOM   Final   Special Requests NONE   Final   Culture  Setup Time     Final   Value: 08/25/2013 18:01     Performed at [facility]     Final   Value: >=100,000 COLONIES/ML     Performed at [facility]   Culture     Final   Value: Multiple bacterial morphotypes present, none predominant. Suggest appropriate recollection if clinically indicated.     Performed at [facility]   Report Status 08/26/2013 FINAL   Final  MRSA PCR SCREENING     Status: None   Collection Time    08/25/13  3:52 PM      Result Value Ref Range Status   MRSA by PCR NEGATIVE  NEGATIVE Final   Comment:            The GeneXpert MRSA Assay (FDA     approved for NASAL specimens     only), is  one component of a     comprehensive MRSA colonization     surveillance program. It is not     intended to diagnose MRSA     infection nor to guide or     monitor treatment for     MRSA infections.  CULTURE, BLOOD (ROUTINE X 2)     Status: None   Collection Time    08/25/13  4:04 PM      Result Value Ref Range Status   Specimen Description BLOOD RIGHT HAND   Final   Special Requests BOTTLES DRAWN AEROBIC AND ANAEROBIC 3CC   Final   Culture  Setup Time     Final   Value: 08/25/2013 22:01     Performed at [facility]   Culture     Final   Value: MICROAEROPHILIC STREPTOCOCCI     Note: Standardized susceptibility testing for this organism is not available.     Note: Gram Stain Report Called to,Read Back By and Verified With: HEATHER RICHARD ON 08/26/2013 AT 9:48P BY Dennard Nip     Performed at [facility]   Report Status 08/29/2013 FINAL   Final  CULTURE, BLOOD (ROUTINE X 2)     Status: None   Collection Time    08/25/13  4:09 PM      Result Value Ref Range Status   Specimen Description BLOOD LEFT HAND   Final   Special Requests BOTTLES DRAWN AEROBIC ONLY 4CC   Final   Culture  Setup Time     Final   Value: 08/25/2013 21:59     Performed at [facility]   Culture     Final   Value: MICROAEROPHILIC STREPTOCOCCI     Note: Standardized susceptibility testing for this organism is not available.     Note: Gram Stain Report Called to,Read Back By and Verified With: Sharlyne Pacas [id number] BRMEL     Performed at [facility]   Report Status 08/29/2013 FINAL   Final  CULTURE, BLOOD (ROUTINE X 2)     Status: None   Collection Time    08/28/13  8:05 AM      Result Value Ref Range Status   Specimen Description BLOOD LEFT HAND   Final   Special Requests BOTTLES DRAWN AEROBIC ONLY 10CC   Final   Culture  Setup Time     Final   Value: 08/28/2013 14:27     Performed at [facility]   Culture     Final   Value:        BLOOD CULTURE RECEIVED NO  GROWTH TO DATE CULTURE WILL BE HELD FOR 5 DAYS BEFORE ISSUING A FINAL NEGATIVE REPORT     Performed at [facility]   Report Status PENDING   Incomplete  CULTURE, BLOOD (ROUTINE X 2)     Status: None   Collection Time    08/28/13  8:10 AM      Result Value Ref Range Status   Specimen Description BLOOD RIGHT HAND   Final   Special Requests BOTTLES DRAWN AEROBIC AND ANAEROBIC 10CC   Final   Culture  Setup Time     Final   Value: 08/28/2013 14:27     Performed at [facility]   Culture     Final   Value:        BLOOD CULTURE RECEIVED NO GROWTH TO DATE CULTURE WILL BE HELD FOR 5 DAYS BEFORE ISSUING A FINAL NEGATIVE REPORT     Performed at [facility]   Report Status PENDING   Incomplete       Time spent : 35 minutes     Eulogio Bear DO [facility] Office  [phone] Pager [phone]  **If unable to reach the above provider after paging please contact the [place] @ [phone]  On-Call/Text Page:      [email or web address]      password [email or web address]  If 7PM-7AM, please contact night-coverage [email or web address] Password [email or web address] 09/01/2013, 10:06 AM   LOS: 7 days

## 2013-09-02 LAB — BASIC METABOLIC PANEL
BUN: 4 mg/dL — AB (ref 6–23)
CALCIUM: 8.8 mg/dL (ref 8.4–10.5)
CHLORIDE: 102 meq/L (ref 96–112)
CO2: 24 meq/L (ref 19–32)
CREATININE: 0.71 mg/dL (ref 0.50–1.10)
GFR calc non Af Amer: >90 mL/min (ref >90)
Glucose, Bld: 95 mg/dL (ref 70–99)
Potassium: 4.2 mEq/L (ref 3.7–5.3)
Sodium: 136 mEq/L — ABNORMAL LOW (ref 137–147)

## 2013-09-02 LAB — IRON AND TIBC
Iron: 34 ug/dL — ABNORMAL LOW (ref 42–135)
Saturation Ratios: 16 % — ABNORMAL LOW (ref 20–55)
TIBC: 211 ug/dL — AB (ref 250–470)
UIBC: 177 ug/dL (ref 125–400)

## 2013-09-02 LAB — CBC
HEMATOCRIT: 19.3 % — AB (ref 36.0–46.0)
Hemoglobin: 6.7 g/dL — CL (ref 12.0–15.0)
MCH: 28.5 pg (ref 26.0–34.0)
MCHC: 34.7 g/dL (ref 30.0–36.0)
MCV: 82.1 fL (ref 78.0–100.0)
Platelets: 562 10*3/uL — ABNORMAL HIGH (ref 150–400)
RBC: 2.35 MIL/uL — ABNORMAL LOW (ref 3.87–5.11)
RDW: 14.1 % (ref 11.5–15.5)
WBC: 27.6 10*3/uL — ABNORMAL HIGH (ref 4.0–10.5)

## 2013-09-02 LAB — ABO/RH: ABO/RH(D): A POS

## 2013-09-02 LAB — PREPARE RBC (CROSSMATCH)

## 2013-09-02 LAB — TRANSFERRIN: Transferrin: 161 mg/dL — ABNORMAL LOW (ref 200–360)

## 2013-09-02 MED ORDER — LEVOFLOXACIN 750 MG PO TABS
750.0000 mg | ORAL_TABLET | Freq: Every day | ORAL | Status: DC
  Administered 2013-09-02 – 2013-09-04 (×3): 750 mg via ORAL
  Filled 2013-09-02 (×4): qty 1

## 2013-09-02 NOTE — Progress Notes (Signed)
Patient refused blood transfusion at this time.  Will wait until morning to decide. Koleen Nimrod, PA notified.

## 2013-09-02 NOTE — Progress Notes (Signed)
Progress Note  Nicole Alexander NIO:[id number] DOB: [birth year] DOA: 08/25/2013 PCP: Antony Blackbird, MD  Brief narrative: 37 year old female patient with no significant past medical history. Endorsed cough and shortness of breath for one week. Was initially treated at urgent care and was prescribed a prednisone taper but did not see any improvement in her symptoms. Her symptoms actually worsened to the point she could not even walk to the bathroom without significant dyspnea. She also noticed fevers 3 days prior to her presentation to [facility]. Upon arrival to the emergency department patient was not hypoxic but had significant tachypnea and was tachycardic with a heart rate of 142. She was given fluid challenges. In addition her sodium was 128 with potassium of 2.5 and a bicarbonate of 17. Her BUN was 43 and creatinine was 1.9 with a lactic acid of 3.6 and white count of 27,000. Chest x-ray revealed infiltrate right middle lung.  Assessment/Plan:  Sepsis -due to pharyngitis/tonsillitis with bacteremia -Sepsis physiology is improving but has not yet resolved (remains tachy, WBC markedly elevated) -Continue supportive care -Not hypotensive so discontinued stress dose steroids 2/9 -urinary strep and legionella negative  Acute respiratory failure due to septic emboli -remains on RA -suspect infiltrate seen on CXR not PNA but is emboli with likely surrounding infarct -Continue antibiotics and supportive care -Influenza negative -urinary strep and legionella neg -WBC trend down finally -consider repeating CT scan and consult ENT if patient worsens- CT scan of neck with contrast  Left neck pain/swelling 2/2 septic thrombophlebitis - due to septic thrombophlebitis (non occlusive) of the IJ in setting of acute pharyngitis/tonsilitis with multiple septic pulmonary embolization (Lemierre syndrome) -consult ID -UptoDate rec anticoagulation only in setting of extensive  phlebitis -cont prn Oxycodone / IV MSO4/IV Toradol (with Pepcid)  Microaerophilic streptococci bacteremia - ID following -repeat blood cx's obtained 2/11   Hypokalemia -Replete prn   Right elbow pain/right knee pain -There is point tenderness noted at the lateral aspect of the right elbow joint but no current erythema or appreciable effusion - there is concern of course that this joint could have been seeded with the patient's bacteremia - we will continue antibiotics as noted above - should the patient develop an effusion or erythema of the joint or should the pain not improve with ongoing antibiotic therapy we will obtain an MRI of the joint and consider Orthopedic consultation  ? UTI -> 100,000 colonies multiple morphotypes  Acute kidney injury -resolved after hydration   Anemia -acute disease with dilution -no apparent blood loss As < 7, will give 1 unit PRBC  Dehydration with hyponatremia -Sodium increased with rehydration -positive 5700 cc so decreased IVFs KVO -oral intake improved -cont supplements with resource and/or boost  Lactic acidosis  -Secondary to low perfusion from sepsis - resolving  Thrush -nystatin   DVT prophylaxis: Subcutaneous heparin Code Status: Full Family Communication: No family at bedside Disposition Plan/Expected LOS: 1-2 more days  Consultants:  ID  Procedures: None  Antibiotics: Zithromax 2/9 >>> 2/10 Rocephin 2/9 >>> Clindamycin 2/10 >>>2/14  HPI/Subjective: Neck feeling better Mouth feeling better after nystatin added  Objective: Blood pressure 151/97, pulse 112, temperature 98.1 F (36.7 C), temperature source Oral, resp. rate 22, height 5\' 9"  (1.753 m), weight 105.779 kg (233 lb 3.2 oz), last menstrual period 08/18/2013, SpO2 100.00%.  Intake/Output Summary (Last 24 hours) at 09/02/13 0817 Last data filed at 09/02/13 0700  Gross per 24 hour  Intake    650 ml  Output  0 ml  Net    650 ml   Exam: General: No  acute respiratory distress ENT: remains tender left neck esp at jaw line angle just below pinna Lungs: Right basilar and mid field crackles with no wheeze Cardiovascular: Tachycardic but regular with no appreciable rub or murmur - no peripheral edema Abdomen: Nontender, nondistended, soft, bowel sounds positive, no rebound, no ascites, no appreciable mass Musculoskeletal: No significant cyanosis, clubbing of bilateral lower extremities-right elbow without erythema or effusion- right knee without reproducible pain on palpation and no erythema or effusion Neurological: Alert and oriented x 3, moves all extremities x 4 without focal neurological deficits, CN 2-12 intact  Scheduled Meds:  Scheduled Meds: . cefTRIAXone (ROCEPHIN)  IV  1 g Intravenous Q24H  . lactose free nutrition  237 mL Oral TID WC   Or  . feeding supplement (RESOURCE BREEZE)  1 Container Oral TID WC  . guaiFENesin  1,200 mg Oral BID  . heparin  5,000 Units Subcutaneous 3 times per day  . ketorolac  15 mg Intravenous 4 times per day  . nystatin  5 mL Oral QID  . pantoprazole  40 mg Oral Daily  . sodium chloride  3 mL Intravenous Q12H   Continuous Infusions: . sodium chloride 20 mL/hr at 09/01/13 1900    Data Reviewed: Basic Metabolic Panel:  Recent Labs Lab 08/27/13 0240 08/28/13 0230 08/29/13 0251 08/30/13 0433 09/01/13 1915  NA 133* 132* 133* 132* 136*  K 3.6* 3.4* 3.3* 4.1 4.1  CL 103 101 101 100 102  CO2 17* 17* 18* 20 22  GLUCOSE 116* 127* 122* 97 127*  BUN 20 12 9 7  4*  CREATININE 0.85 0.70 0.65 0.68 0.68  CALCIUM 7.7* 7.7* 7.4* 7.8* 8.4  MG  --   --  1.5 1.9  --   PHOS  --   --   --  3.2  --    Liver Function Tests: No results found for this basename: AST, ALT, ALKPHOS, BILITOT, PROT, ALBUMIN,  in the last 168 hours No results found for this basename: LIPASE, AMYLASE,  in the last 168 hours No results found for this basename: AMMONIA,  in the last 168 hours  CBC:  Recent Labs Lab  08/29/13 0251 08/30/13 0433 08/31/13 0400 09/01/13 1915 09/02/13 0645  WBC 38.0* 30.0* 27.7* 29.1* 27.6*  HGB 7.8* 7.6* 7.6* 6.7* 6.7*  HCT 21.4* 20.8* 22.5* 18.9* 19.3*  MCV 78.4 79.7 80.1 81.5 82.1  PLT 247 318 372 485* 562*    Recent Results (from the past 240 hour(s))  RAPID STREP SCREEN     Status: None   Collection Time    08/25/13 12:28 PM      Result Value Ref Range Status   Streptococcus, Group A Screen (Direct) NEGATIVE  NEGATIVE Final   Comment: (NOTE)     A Rapid Antigen test may result negative if the antigen level in the     sample is below the detection level of this test. The FDA has not     cleared this test as a stand-alone test therefore the rapid antigen     negative result has reflexed to a Group A Strep culture.  CULTURE, GROUP A STREP     Status: None   Collection Time    08/25/13 12:28 PM      Result Value Ref Range Status   Specimen Description THROAT   Final   Special Requests NONE   Final   Culture  Final   Value: No Beta Hemolytic Streptococci Isolated     Performed at [facility]   Report Status 08/27/2013 FINAL   Final  URINE CULTURE     Status: None   Collection Time    08/25/13 12:28 PM      Result Value Ref Range Status   Specimen Description URINE, RANDOM   Final   Special Requests NONE   Final   Culture  Setup Time     Final   Value: 08/25/2013 18:01     Performed at [facility]     Final   Value: >=100,000 COLONIES/ML     Performed at [facility]   Culture     Final   Value: Multiple bacterial morphotypes present, none predominant. Suggest appropriate recollection if clinically indicated.     Performed at [facility]   Report Status 08/26/2013 FINAL   Final  MRSA PCR SCREENING     Status: None   Collection Time    08/25/13  3:52 PM      Result Value Ref Range Status   MRSA by PCR NEGATIVE  NEGATIVE Final   Comment:            The GeneXpert MRSA Assay (FDA     approved for  NASAL specimens     only), is one component of a     comprehensive MRSA colonization     surveillance program. It is not     intended to diagnose MRSA     infection nor to guide or     monitor treatment for     MRSA infections.  CULTURE, BLOOD (ROUTINE X 2)     Status: None   Collection Time    08/25/13  4:04 PM      Result Value Ref Range Status   Specimen Description BLOOD RIGHT HAND   Final   Special Requests BOTTLES DRAWN AEROBIC AND ANAEROBIC 3CC   Final   Culture  Setup Time     Final   Value: 08/25/2013 22:01     Performed at [facility]   Culture     Final   Value: MICROAEROPHILIC STREPTOCOCCI     Note: Standardized susceptibility testing for this organism is not available.     Note: Gram Stain Report Called to,Read Back By and Verified With: HEATHER RICHARD ON 08/26/2013 AT 9:48P BY Dennard Nip     Performed at [facility]   Report Status 08/29/2013 FINAL   Final  CULTURE, BLOOD (ROUTINE X 2)     Status: None   Collection Time    08/25/13  4:09 PM      Result Value Ref Range Status   Specimen Description BLOOD LEFT HAND   Final   Special Requests BOTTLES DRAWN AEROBIC ONLY 4CC   Final   Culture  Setup Time     Final   Value: 08/25/2013 21:59     Performed at [facility]   Culture     Final   Value: MICROAEROPHILIC STREPTOCOCCI     Note: Standardized susceptibility testing for this organism is not available.     Note: Gram Stain Report Called to,Read Back By and Verified With: Sharlyne Pacas [id number] BRMEL     Performed at [facility]   Report Status 08/29/2013 FINAL   Final  CULTURE, BLOOD (ROUTINE X 2)     Status: None   Collection Time    08/28/13  8:05 AM      Result Value Ref Range Status   Specimen Description BLOOD LEFT HAND   Final   Special Requests BOTTLES DRAWN AEROBIC ONLY 10CC   Final   Culture  Setup Time     Final   Value: 08/28/2013 14:27     Performed at [facility]   Culture     Final   Value:         BLOOD CULTURE RECEIVED NO GROWTH TO DATE CULTURE WILL BE HELD FOR 5 DAYS BEFORE ISSUING A FINAL NEGATIVE REPORT     Performed at [facility]   Report Status PENDING   Incomplete  CULTURE, BLOOD (ROUTINE X 2)     Status: None   Collection Time    08/28/13  8:10 AM      Result Value Ref Range Status   Specimen Description BLOOD RIGHT HAND   Final   Special Requests BOTTLES DRAWN AEROBIC AND ANAEROBIC 10CC   Final   Culture  Setup Time     Final   Value: 08/28/2013 14:27     Performed at [facility]   Culture     Final   Value:        BLOOD CULTURE RECEIVED NO GROWTH TO DATE CULTURE WILL BE HELD FOR 5 DAYS BEFORE ISSUING A FINAL NEGATIVE REPORT     Performed at [facility]   Report Status PENDING   Incomplete       Time spent : 35 minutes     Eulogio Bear DO [facility] Office  [phone] Pager [phone]  **If unable to reach the above provider after paging please contact the [facility] @ [phone]  On-Call/Text Page:      [email or web address]      password [email or web address]  If 7PM-7AM, please contact night-coverage [email or web address] Password [email or web address] 09/02/2013, 8:17 AM   LOS: 8 days

## 2013-09-02 NOTE — Progress Notes (Addendum)
Patient ID: Nicole Alexander, female   DOB: 05-Dec-1975, 38 y.o.   MRN: [id number]         [facility]    Date of Admission:  08/25/2013            Day 8 ceftriaxone         Principal Problem:   Lemierre syndrome Active Problems:   Elbow pain, right   Gram-positive bacteremia   Sepsis   CAP (community acquired pneumonia)   AKI (acute kidney injury)   Dehydration with hyponatremia   High anion gap metabolic acidosis   Leukocytosis   UTI (urinary tract infection)   Neck pain on left side   . cefTRIAXone (ROCEPHIN)  IV  1 g Intravenous Q24H  . lactose free nutrition  237 mL Oral TID WC   Or  . feeding supplement (RESOURCE BREEZE)  1 Container Oral TID WC  . guaiFENesin  1,200 mg Oral BID  . heparin  5,000 Units Subcutaneous 3 times per day  . ketorolac  15 mg Intravenous 4 times per day  . nystatin  5 mL Oral QID  . pantoprazole  40 mg Oral Daily  . sodium chloride  3 mL Intravenous Q12H    Subjective: She is feeling much better and eating solid food without much difficulty. She has no cough or shortness of breath. She has only minimal right elbow discomfort in her right knee is much better.  History reviewed. No pertinent past medical history.  History  Substance Use Topics  . Smoking status: Former Smoker -- 0.50 packs/day  . Smokeless tobacco: Never Used  . Alcohol Use: Yes     Comment: occasional    Family History  Problem Relation Age of Onset  . Heart disease Mother     PTCA/stent  . Hypertension Mother   . Diabetes Mother   . Hyperlipidemia Mother   . Cancer Father     lung  . Hypertension Father   . Diabetes Father   . Hyperlipidemia Father   . Hypertension Brother   . Cancer Maternal Grandfather     bone    Allergies  Allergen Reactions  . Penicillins Swelling    "swole up like a balloon"    Objective: Temp:  [98.1 F (36.7 C)-99.7 F (37.6 C)] 98.1 F (36.7 C) (02/15 2150) Pulse Rate:  [106-118] 112  (02/15 2150) Resp:  [22-23] 22 (02/15 2150) BP: (130-151)/(84-101) 151/97 mmHg (02/15 2150) SpO2:  [100 %] 100 % (02/15 2150) Weight:  [105.779 kg (233 lb 3.2 oz)] 105.779 kg (233 lb 3.2 oz) (02/15 2150)  General: She is more alert and smiling Skin: No rash Oral: No thrush Neck: Still slightly tender on left side Lungs: Clear Cor: Tachycardic but regular S1 and S2 with no murmur Joints and extremities: Good range of motion of the right elbow and right knee without clear evidence of infection  Lab Results Lab Results  Component Value Date   WBC 27.6* 09/02/2013   HGB 6.7* 09/02/2013   HCT 19.3* 09/02/2013   MCV 82.1 09/02/2013   PLT 562* 09/02/2013    Lab Results  Component Value Date   CREATININE 0.71 09/02/2013   BUN 4* 09/02/2013   NA 136* 09/02/2013   K 4.2 09/02/2013   CL 102 09/02/2013   CO2 24 09/02/2013    Lab Results  Component Value Date   ALT 100* 08/26/2013   AST 71* 08/26/2013   ALKPHOS 159* 08/26/2013   BILITOT 2.5*  08/26/2013      Microbiology: Recent Results (from the past 240 hour(s))  RAPID STREP SCREEN     Status: None   Collection Time    08/25/13 12:28 PM      Result Value Ref Range Status   Streptococcus, Group A Screen (Direct) NEGATIVE  NEGATIVE Final   Comment: (NOTE)     A Rapid Antigen test may result negative if the antigen level in the     sample is below the detection level of this test. The FDA has not     cleared this test as a stand-alone test therefore the rapid antigen     negative result has reflexed to a Group A Strep culture.  CULTURE, GROUP A STREP     Status: None   Collection Time    08/25/13 12:28 PM      Result Value Ref Range Status   Specimen Description THROAT   Final   Special Requests NONE   Final   Culture     Final   Value: No Beta Hemolytic Streptococci Isolated     Performed at [facility]   Report Status 08/27/2013 FINAL   Final  URINE CULTURE     Status: None   Collection Time    08/25/13 12:28 PM      Result  Value Ref Range Status   Specimen Description URINE, RANDOM   Final   Special Requests NONE   Final   Culture  Setup Time     Final   Value: 08/25/2013 18:01     Performed at [place]     Final   Value: >=100,000 COLONIES/ML     Performed at [facility]   Culture     Final   Value: Multiple bacterial morphotypes present, none predominant. Suggest appropriate recollection if clinically indicated.     Performed at [facility]   Report Status 08/26/2013 FINAL   Final  MRSA PCR SCREENING     Status: None   Collection Time    08/25/13  3:52 PM      Result Value Ref Range Status   MRSA by PCR NEGATIVE  NEGATIVE Final   Comment:            The GeneXpert MRSA Assay (FDA     approved for NASAL specimens     only), is one component of a     comprehensive MRSA colonization     surveillance program. It is not     intended to diagnose MRSA     infection nor to guide or     monitor treatment for     MRSA infections.  CULTURE, BLOOD (ROUTINE X 2)     Status: None   Collection Time    08/25/13  4:04 PM      Result Value Ref Range Status   Specimen Description BLOOD RIGHT HAND   Final   Special Requests BOTTLES DRAWN AEROBIC AND ANAEROBIC 3CC   Final   Culture  Setup Time     Final   Value: 08/25/2013 22:01     Performed at [facility]   Culture     Final   Value: MICROAEROPHILIC STREPTOCOCCI     Note: Standardized susceptibility testing for this organism is not available.     Note: Gram Stain Report Called to,Read Back By and Verified With: HEATHER RICHARD ON 08/26/2013 AT 9:48P BY WILEJ     Performed at [facility]   Report Status 08/29/2013 FINAL   Final  CULTURE, BLOOD (ROUTINE X 2)     Status: None   Collection Time    08/25/13  4:09 PM      Result Value Ref Range Status   Specimen Description BLOOD LEFT HAND   Final   Special Requests BOTTLES DRAWN AEROBIC ONLY 4CC   Final   Culture  Setup Time     Final   Value:  08/25/2013 21:59     Performed at [facility]   Culture     Final   Value: MICROAEROPHILIC STREPTOCOCCI     Note: Standardized susceptibility testing for this organism is not available.     Note: Gram Stain Report Called to,Read Back By and Verified With: Sharlyne Pacas [id number] BRMEL     Performed at [facility]   Report Status 08/29/2013 FINAL   Final  CULTURE, BLOOD (ROUTINE X 2)     Status: None   Collection Time    08/28/13  8:05 AM      Result Value Ref Range Status   Specimen Description BLOOD LEFT HAND   Final   Special Requests BOTTLES DRAWN AEROBIC ONLY 10CC   Final   Culture  Setup Time     Final   Value: 08/28/2013 14:27     Performed at [facility]   Culture     Final   Value:        BLOOD CULTURE RECEIVED NO GROWTH TO DATE CULTURE WILL BE HELD FOR 5 DAYS BEFORE ISSUING A FINAL NEGATIVE REPORT     Performed at [facility]   Report Status PENDING   Incomplete  CULTURE, BLOOD (ROUTINE X 2)     Status: None   Collection Time    08/28/13  8:10 AM      Result Value Ref Range Status   Specimen Description BLOOD RIGHT HAND   Final   Special Requests BOTTLES DRAWN AEROBIC AND ANAEROBIC 10CC   Final   Culture  Setup Time     Final   Value: 08/28/2013 14:27     Performed at [facility]   Culture     Final   Value:        BLOOD CULTURE RECEIVED NO GROWTH TO DATE CULTURE WILL BE HELD FOR 5 DAYS BEFORE ISSUING A FINAL NEGATIVE REPORT     Performed at [facility]   Report Status PENDING   Incomplete   Assessment: She is improving on therapy for tonsillitis, streptococcal bacteremia and Lemierre syndrome.  Plan: 1. Recommend change to oral levofloxacin (she is allergic to penicillin) and treat one more week  Michel Bickers, MD [facility] [place] [phone] pager   [phone] cell 09/02/2013, 9:37 AM

## 2013-09-03 LAB — TYPE AND SCREEN
ABO/RH(D): A POS
Antibody Screen: NEGATIVE
Unit division: 0

## 2013-09-03 LAB — CBC
HEMATOCRIT: 22.7 % — AB (ref 36.0–46.0)
HEMOGLOBIN: 7.8 g/dL — AB (ref 12.0–15.0)
MCH: 29 pg (ref 26.0–34.0)
MCHC: 34.4 g/dL (ref 30.0–36.0)
MCV: 84.4 fL (ref 78.0–100.0)
Platelets: 568 10*3/uL — ABNORMAL HIGH (ref 150–400)
RBC: 2.69 MIL/uL — ABNORMAL LOW (ref 3.87–5.11)
RDW: 14.4 % (ref 11.5–15.5)
WBC: 25.8 10*3/uL — ABNORMAL HIGH (ref 4.0–10.5)

## 2013-09-03 LAB — BASIC METABOLIC PANEL
BUN: 5 mg/dL — ABNORMAL LOW (ref 6–23)
CO2: 25 meq/L (ref 19–32)
Calcium: 8.7 mg/dL (ref 8.4–10.5)
Chloride: 101 mEq/L (ref 96–112)
Creatinine, Ser: 0.72 mg/dL (ref 0.50–1.10)
GFR calc Af Amer: >90 mL/min (ref >90)
GFR calc non Af Amer: >90 mL/min (ref >90)
GLUCOSE: 92 mg/dL (ref 70–99)
POTASSIUM: 4.5 meq/L (ref 3.7–5.3)
Sodium: 137 mEq/L (ref 137–147)

## 2013-09-03 LAB — CULTURE, BLOOD (ROUTINE X 2)
Culture: NO GROWTH
Culture: NO GROWTH

## 2013-09-03 NOTE — Progress Notes (Signed)
Progress Note  Nicole Alexander ZOX:[id number] DOB: 1976/02/08 DOA: 08/25/2013 PCP: Antony Blackbird, MD  Brief narrative: 38 year old female patient with no significant past medical history. Endorsed cough and shortness of breath for one week. Was initially treated at urgent care and was prescribed a prednisone taper but did not see any improvement in her symptoms. Her symptoms actually worsened to the point she could not even walk to the bathroom without significant dyspnea. She also noticed fevers 3 days prior to her presentation to [facility] Upon arrival to the emergency department patient was not hypoxic but had significant tachypnea and was tachycardic with a heart rate of 142. She was given fluid challenges. In addition her sodium was 128 with potassium of 2.5 and a bicarbonate of 17. Her BUN was 43 and creatinine was 1.9 with a lactic acid of 3.6 and white count of 27,000. Chest x-ray revealed infiltrate right middle lung.  Assessment/Plan:  Sepsis -due to pharyngitis/tonsillitis with bacteremia -Sepsis physiology is improving but has not yet resolved (remains tachy, WBC markedly elevated) -Continue supportive care -Not hypotensive so discontinued stress dose steroids 2/9 -urinary strep and legionella negative  Acute respiratory failure due to septic emboli -remains on RA -suspect infiltrate seen on CXR not PNA but is emboli with likely surrounding infarct -Continue antibiotics and supportive care -Influenza negative -urinary strep and legionella neg -WBC trend down finally -consider repeating CT scan and consult ENT if patient worsens- CT scan of neck with contrast  Left neck pain/swelling 2/2 septic thrombophlebitis - due to septic thrombophlebitis (non occlusive) of the IJ in setting of acute pharyngitis/tonsilitis with multiple septic pulmonary embolization (Lemierre syndrome) -consult ID -UptoDate rec anticoagulation only in setting of extensive  phlebitis -cont prn Oxycodone / IV MSO4/IV Toradol (with Pepcid)  Microaerophilic streptococci bacteremia - ID following -repeat blood cx's obtained 2/11   Hypokalemia -Replete prn   Right elbow pain/right knee pain -There is point tenderness noted at the lateral aspect of the right elbow joint but no current erythema or appreciable effusion - there is concern of course that this joint could have been seeded with the patient's bacteremia - we will continue antibiotics as noted above - should the patient develop an effusion or erythema of the joint or should the pain not improve with ongoing antibiotic therapy we will obtain an MRI of the joint and consider Orthopedic consultation  ? UTI -> 100,000 colonies multiple morphotypes  Acute kidney injury -resolved after hydration   Anemia -acute disease with dilution -no apparent blood loss As < 7, will give 1 unit PRBC  Dehydration with hyponatremia -Sodium increased with rehydration -positive 5700 cc so decreased IVFs KVO -oral intake improved -cont supplements with resource and/or boost  Lactic acidosis  -Secondary to low perfusion from sepsis - resolving  Thrush -nystatin   DVT prophylaxis: Subcutaneous heparin Code Status: Full Family Communication: No family at bedside Disposition Plan/Expected LOS: d/c in AM  Consultants:  ID  Procedures: None  Antibiotics: Zithromax 2/9 >>> 2/10 Rocephin 2/9 >>>2/16 levaquin 2/17 xx 1 week Clindamycin 2/10 >>>2/14  HPI/Subjective: Neck feeling better Mouth feeling better after nystatin added  Objective: Blood pressure 142/87, pulse 101, temperature 98.4 F (36.9 C), temperature source Oral, resp. rate 18, height 5\' 9"  (1.753 m), weight 106.7 kg (235 lb 3.7 oz), last menstrual period 08/18/2013, SpO2 96.00%.  Intake/Output Summary (Last 24 hours) at 09/03/13 1031 Last data filed at 09/02/13 1300  Gross per 24 hour  Intake 1372.5 ml  Output      0 ml  Net 1372.5 ml    Exam: General: No acute respiratory distress ENT: remains tender left neck esp at jaw line angle just below pinna Lungs: Right basilar and mid field crackles with no wheeze Cardiovascular: Tachycardic but regular with no appreciable rub or murmur - no peripheral edema Abdomen: Nontender, nondistended, soft, bowel sounds positive, no rebound, no ascites, no appreciable mass Musculoskeletal: No significant cyanosis, clubbing of bilateral lower extremities-right elbow without erythema or effusion- right knee without reproducible pain on palpation and no erythema or effusion Neurological: Alert and oriented x 3, moves all extremities x 4 without focal neurological deficits, CN 2-12 intact  Scheduled Meds:  Scheduled Meds: . lactose free nutrition  237 mL Oral TID WC   Or  . feeding supplement (RESOURCE BREEZE)  1 Container Oral TID WC  . guaiFENesin  1,200 mg Oral BID  . heparin  5,000 Units Subcutaneous 3 times per day  . levofloxacin  750 mg Oral Daily  . nystatin  5 mL Oral QID  . pantoprazole  40 mg Oral Daily  . sodium chloride  3 mL Intravenous Q12H   Continuous Infusions: . sodium chloride 20 mL/hr at 09/01/13 1900    Data Reviewed: Basic Metabolic Panel:  Recent Labs Lab 08/28/13 0230 08/29/13 0251 08/30/13 0433 09/01/13 1915 09/02/13 0645 09/03/13 0405  NA 132* 133* 132* 136* 136* 137  K 3.4* 3.3* 4.1 4.1 4.2 4.5  CL 101 101 100 102 102 101  CO2 17* 18* 20 22 24 25   GLUCOSE 127* 122* 97 127* 95 92  BUN 12 9 7  4* 4* 5*  CREATININE 0.70 0.65 0.68 0.68 0.71 0.72  CALCIUM 7.7* 7.4* 7.8* 8.4 8.8 8.7  MG  --  1.5 1.9  --   --   --   PHOS  --   --  3.2  --   --   --    Liver Function Tests: No results found for this basename: AST, ALT, ALKPHOS, BILITOT, PROT, ALBUMIN,  in the last 168 hours No results found for this basename: LIPASE, AMYLASE,  in the last 168 hours No results found for this basename: AMMONIA,  in the last 168 hours  CBC:  Recent Labs Lab  08/30/13 0433 08/31/13 0400 09/01/13 1915 09/02/13 0645 09/03/13 0405  WBC 30.0* 27.7* 29.1* 27.6* 25.8*  HGB 7.6* 7.6* 6.7* 6.7* 7.8*  HCT 20.8* 22.5* 18.9* 19.3* 22.7*  MCV 79.7 80.1 81.5 82.1 84.4  PLT 318 372 485* 562* 568*    Recent Results (from the past 240 hour(s))  RAPID STREP SCREEN     Status: None   Collection Time    08/25/13 12:28 PM      Result Value Ref Range Status   Streptococcus, Group A Screen (Direct) NEGATIVE  NEGATIVE Final   Comment: (NOTE)     A Rapid Antigen test may result negative if the antigen level in the     sample is below the detection level of this test. The FDA has not     cleared this test as a stand-alone test therefore the rapid antigen     negative result has reflexed to a Group A Strep culture.  CULTURE, GROUP A STREP     Status: None   Collection Time    08/25/13 12:28 PM      Result Value Ref Range Status   Specimen Description THROAT   Final   Special Requests NONE   Final  Culture     Final   Value: No Beta Hemolytic Streptococci Isolated     Performed at [facility]   Report Status 08/27/2013 FINAL   Final  URINE CULTURE     Status: None   Collection Time    08/25/13 12:28 PM      Result Value Ref Range Status   Specimen Description URINE, RANDOM   Final   Special Requests NONE   Final   Culture  Setup Time     Final   Value: 08/25/2013 18:01     Performed at [facility] Count     Final   Value: >=100,000 COLONIES/ML     Performed at [facility]   Culture     Final   Value: Multiple bacterial morphotypes present, none predominant. Suggest appropriate recollection if clinically indicated.     Performed at [facility]   Report Status 08/26/2013 FINAL   Final  MRSA PCR SCREENING     Status: None   Collection Time    08/25/13  3:52 PM      Result Value Ref Range Status   MRSA by PCR NEGATIVE  NEGATIVE Final   Comment:            The GeneXpert MRSA Assay (FDA     approved  for NASAL specimens     only), is one component of a     comprehensive MRSA colonization     surveillance program. It is not     intended to diagnose MRSA     infection nor to guide or     monitor treatment for     MRSA infections.  CULTURE, BLOOD (ROUTINE X 2)     Status: None   Collection Time    08/25/13  4:04 PM      Result Value Ref Range Status   Specimen Description BLOOD RIGHT HAND   Final   Special Requests BOTTLES DRAWN AEROBIC AND ANAEROBIC 3CC   Final   Culture  Setup Time     Final   Value: 08/25/2013 22:01     Performed at [facility]   Culture     Final   Value: MICROAEROPHILIC STREPTOCOCCI     Note: Standardized susceptibility testing for this organism is not available.     Note: Gram Stain Report Called to,Read Back By and Verified With: HEATHER RICHARD ON 08/26/2013 AT 9:48P BY Serafina Mitchell     Performed at [facility]   Report Status 08/29/2013 FINAL   Final  CULTURE, BLOOD (ROUTINE X 2)     Status: None   Collection Time    08/25/13  4:09 PM      Result Value Ref Range Status   Specimen Description BLOOD LEFT HAND   Final   Special Requests BOTTLES DRAWN AEROBIC ONLY 4CC   Final   Culture  Setup Time     Final   Value: 08/25/2013 21:59     Performed at [facility]   Culture     Final   Value: MICROAEROPHILIC STREPTOCOCCI     Note: Standardized susceptibility testing for this organism is not available.     Note: Gram Stain Report Called to,Read Back By and Verified With: Cindra Eves [id number] BRMEL     Performed at [facility]   Report Status 08/29/2013 FINAL   Final  CULTURE, BLOOD (ROUTINE X 2)     Status: None   Collection  Time    08/28/13  8:05 AM      Result Value Ref Range Status   Specimen Description BLOOD LEFT HAND   Final   Special Requests BOTTLES DRAWN AEROBIC ONLY 10CC   Final   Culture  Setup Time     Final   Value: 08/28/2013 14:27     Performed at [facility]   Culture     Final   Value: NO  GROWTH 5 DAYS     Performed at [facility]   Report Status 09/03/2013 FINAL   Final  CULTURE, BLOOD (ROUTINE X 2)     Status: None   Collection Time    08/28/13  8:10 AM      Result Value Ref Range Status   Specimen Description BLOOD RIGHT HAND   Final   Special Requests BOTTLES DRAWN AEROBIC AND ANAEROBIC 10CC   Final   Culture  Setup Time     Final   Value: 08/28/2013 14:27     Performed at [place]     Final   Value: NO GROWTH 5 DAYS     Performed at [facility]   Report Status 09/03/2013 FINAL   Final       Time spent : 35 minutes     Eulogio Bear DO [facility] Office  [phone] Pager [phone]  **If unable to reach the above provider after paging please contact the [place] @ [phone]  On-Call/Text Page:      [email or web address]      password [email or web address]  If 7PM-7AM, please contact night-coverage [email or web address] Password [email or web address] 09/03/2013, 10:31 AM   LOS: 9 days

## 2013-09-03 NOTE — Progress Notes (Addendum)
Patient ID: Nicole Alexander, female   DOB: November 06, 1975, 38 y.o.   MRN: [id number]         [facility]    Date of Admission:  08/25/2013            Day 9 antibiotics         Principal Problem:   Lemierre syndrome Active Problems:   Elbow pain, right   Gram-positive bacteremia   Sepsis   CAP (community acquired pneumonia)   AKI (acute kidney injury)   Dehydration with hyponatremia   High anion gap metabolic acidosis   Leukocytosis   UTI (urinary tract infection)   Neck pain on left side   . lactose free nutrition  237 mL Oral TID WC   Or  . feeding supplement (RESOURCE BREEZE)  1 Container Oral TID WC  . guaiFENesin  1,200 mg Oral BID  . heparin  5,000 Units Subcutaneous 3 times per day  . levofloxacin  750 mg Oral Daily  . nystatin  5 mL Oral QID  . pantoprazole  40 mg Oral Daily  . sodium chloride  3 mL Intravenous Q12H    Subjective: She is feeling much better and eating solid food without much difficulty. She has no cough or shortness of breath.   History reviewed. No pertinent past medical history.  History  Substance Use Topics  . Smoking status: Former Smoker -- 0.50 packs/day  . Smokeless tobacco: Never Used  . Alcohol Use: Yes     Comment: occasional    Family History  Problem Relation Age of Onset  . Heart disease Mother     PTCA/stent  . Hypertension Mother   . Diabetes Mother   . Hyperlipidemia Mother   . Cancer Father     lung  . Hypertension Father   . Diabetes Father   . Hyperlipidemia Father   . Hypertension Brother   . Cancer Maternal Grandfather     bone    Allergies  Allergen Reactions  . Penicillins Swelling    "swole up like a balloon"    Objective: Temp:  [98.3 F (36.8 C)-98.8 F (37.1 C)] 98.4 F (36.9 C) (02/17 0524) Pulse Rate:  [95-103] 95 (02/17 1420) Resp:  [18-19] 19 (02/17 1420) BP: (140-147)/(87-99) 147/93 mmHg (02/17 1420) SpO2:  [96 %-99 %] 98 % (02/17 1420) Weight:  [106.7 kg  (235 lb 3.7 oz)] 106.7 kg (235 lb 3.7 oz) (02/16 2112)  General: She is alert and smiling. She is out walking in the hallway Skin: No rash Oral: No thrush Neck: Still slightly tender on left side Lungs: Clear Cor: Tachycardic but regular S1 and S2 with no murmur Joints and extremities: Good range of motion of the right elbow and right knee without clear evidence of infection  Lab Results Lab Results  Component Value Date   WBC 25.8* 09/03/2013   HGB 7.8* 09/03/2013   HCT 22.7* 09/03/2013   MCV 84.4 09/03/2013   PLT 568* 09/03/2013    Lab Results  Component Value Date   CREATININE 0.72 09/03/2013   BUN 5* 09/03/2013   NA 137 09/03/2013   K 4.5 09/03/2013   CL 101 09/03/2013   CO2 25 09/03/2013    Lab Results  Component Value Date   ALT 100* 08/26/2013   AST 71* 08/26/2013   ALKPHOS 159* 08/26/2013   BILITOT 2.5* 08/26/2013      Microbiology: Recent Results (from the past 240 hour(s))  RAPID STREP SCREEN  Status: None   Collection Time    08/25/13 12:28 PM      Result Value Ref Range Status   Streptococcus, Group A Screen (Direct) NEGATIVE  NEGATIVE Final   Comment: (NOTE)     A Rapid Antigen test may result negative if the antigen level in the     sample is below the detection level of this test. The FDA has not     cleared this test as a stand-alone test therefore the rapid antigen     negative result has reflexed to a Group A Strep culture.  CULTURE, GROUP A STREP     Status: None   Collection Time    08/25/13 12:28 PM      Result Value Ref Range Status   Specimen Description THROAT   Final   Special Requests NONE   Final   Culture     Final   Value: No Beta Hemolytic Streptococci Isolated     Performed at [facility]   Report Status 08/27/2013 FINAL   Final  URINE CULTURE     Status: None   Collection Time    08/25/13 12:28 PM      Result Value Ref Range Status   Specimen Description URINE, RANDOM   Final   Special Requests NONE   Final   Culture  Setup Time      Final   Value: 08/25/2013 18:01     Performed at [place]     Final   Value: >=100,000 COLONIES/ML     Performed at [facility]   Culture     Final   Value: Multiple bacterial morphotypes present, none predominant. Suggest appropriate recollection if clinically indicated.     Performed at [facility]   Report Status 08/26/2013 FINAL   Final  MRSA PCR SCREENING     Status: None   Collection Time    08/25/13  3:52 PM      Result Value Ref Range Status   MRSA by PCR NEGATIVE  NEGATIVE Final   Comment:            The GeneXpert MRSA Assay (FDA     approved for NASAL specimens     only), is one component of a     comprehensive MRSA colonization     surveillance program. It is not     intended to diagnose MRSA     infection nor to guide or     monitor treatment for     MRSA infections.  CULTURE, BLOOD (ROUTINE X 2)     Status: None   Collection Time    08/25/13  4:04 PM      Result Value Ref Range Status   Specimen Description BLOOD RIGHT HAND   Final   Special Requests BOTTLES DRAWN AEROBIC AND ANAEROBIC 3CC   Final   Culture  Setup Time     Final   Value: 08/25/2013 22:01     Performed at [facility]   Culture     Final   Value: MICROAEROPHILIC STREPTOCOCCI     Note: Standardized susceptibility testing for this organism is not available.     Note: Gram Stain Report Called to,Read Back By and Verified With: HEATHER RICHARD ON 08/26/2013 AT 9:48P BY Dennard Nip     Performed at [facility]   Report Status 08/29/2013 FINAL   Final  CULTURE, BLOOD (ROUTINE X 2)     Status:  None   Collection Time    08/25/13  4:09 PM      Result Value Ref Range Status   Specimen Description BLOOD LEFT HAND   Final   Special Requests BOTTLES DRAWN AEROBIC ONLY 4CC   Final   Culture  Setup Time     Final   Value: 08/25/2013 21:59     Performed at [facility]   Culture     Final   Value: MICROAEROPHILIC STREPTOCOCCI     Note:  Standardized susceptibility testing for this organism is not available.     Note: Gram Stain Report Called to,Read Back By and Verified With: Sharlyne Pacas [id number] BRMEL     Performed at [facility]   Report Status 08/29/2013 FINAL   Final  CULTURE, BLOOD (ROUTINE X 2)     Status: None   Collection Time    08/28/13  8:05 AM      Result Value Ref Range Status   Specimen Description BLOOD LEFT HAND   Final   Special Requests BOTTLES DRAWN AEROBIC ONLY 10CC   Final   Culture  Setup Time     Final   Value: 08/28/2013 14:27     Performed at [facility]   Culture     Final   Value: NO GROWTH 5 DAYS     Performed at [facility]   Report Status 09/03/2013 FINAL   Final  CULTURE, BLOOD (ROUTINE X 2)     Status: None   Collection Time    08/28/13  8:10 AM      Result Value Ref Range Status   Specimen Description BLOOD RIGHT HAND   Final   Special Requests BOTTLES DRAWN AEROBIC AND ANAEROBIC 10CC   Final   Culture  Setup Time     Final   Value: 08/28/2013 14:27     Performed at [facility]   Culture     Final   Value: NO GROWTH 5 DAYS     Performed at [facility]   Report Status 09/03/2013 FINAL   Final   Assessment: She is improving on therapy for tonsillitis, streptococcal bacteremia and Lemierre syndrome.  Plan: 1. Continue levofloxacin for one more week 2. I will arrange follow up in my clinic on February 24 3. Please call if I can be of further assistance while she is here  Michel Bickers, MD [facility] for [place] [phone] pager   [phone] cell 09/03/2013, 3:49 PM

## 2013-09-04 LAB — CBC
HCT: 23.7 % — ABNORMAL LOW (ref 36.0–46.0)
Hemoglobin: 8.3 g/dL — ABNORMAL LOW (ref 12.0–15.0)
MCH: 29.9 pg (ref 26.0–34.0)
MCHC: 35 g/dL (ref 30.0–36.0)
MCV: 85.3 fL (ref 78.0–100.0)
Platelets: 598 10*3/uL — ABNORMAL HIGH (ref 150–400)
RBC: 2.78 MIL/uL — AB (ref 3.87–5.11)
RDW: 15.1 % (ref 11.5–15.5)
WBC: 21.9 10*3/uL — ABNORMAL HIGH (ref 4.0–10.5)

## 2013-09-04 LAB — BASIC METABOLIC PANEL
BUN: 5 mg/dL — ABNORMAL LOW (ref 6–23)
CO2: 25 meq/L (ref 19–32)
CREATININE: 0.73 mg/dL (ref 0.50–1.10)
Calcium: 9 mg/dL (ref 8.4–10.5)
Chloride: 98 mEq/L (ref 96–112)
GFR calc Af Amer: >90 mL/min (ref >90)
GFR calc non Af Amer: >90 mL/min (ref >90)
Glucose, Bld: 101 mg/dL — ABNORMAL HIGH (ref 70–99)
Potassium: 4.2 mEq/L (ref 3.7–5.3)
SODIUM: 134 meq/L — AB (ref 137–147)

## 2013-09-04 MED ORDER — LEVOFLOXACIN 750 MG PO TABS: 750.0000 mg | ORAL_TABLET | Freq: Every day | ORAL | Status: DC

## 2013-09-04 MED ORDER — OXYCODONE HCL 5 MG PO TABS
5.0000 mg | ORAL_TABLET | ORAL | Status: DC | PRN
Start: 2013-09-04 — End: 2013-11-05

## 2013-09-04 NOTE — Discharge Summary (Signed)
Physician Discharge Summary  Nicole Alexander [id number] DOB: 06-Apr-1976 DOA: 08/25/2013  PCP: Antony Blackbird, MD  Admit date: 08/25/2013 Discharge date: 09/04/2013  Recommendations for Outpatient Follow-up:  1. Pt will need to follow up with PCP in 2 weeks post discharge 2. Please obtain BMP to evaluate electrolytes and kidney function 3. Please also check CBC to evaluate Hg and Hct levels 4. Follow up with Dr. Michel Bickers 09/10/13  Discharge Diagnoses:  Principal Problem:   Lemierre syndrome Active Problems:   Sepsis   CAP (community acquired pneumonia)   AKI (acute kidney injury)   Dehydration with hyponatremia   High anion gap metabolic acidosis   Leukocytosis   UTI (urinary tract infection)   Neck pain on left side   Elbow pain, right   Gram-positive bacteremia Sepsis  -due to pharyngitis/tonsillitis with bacteremia  -Sepsis physiology is improving but has not yet resolved (remains tachy, WBC markedly elevated)  -Continue supportive care  -Not hypotensive so discontinued stress dose steroids 2/9  -urinary strep and legionella negative  Acute respiratory failure due to septic emboli  -remains on RA  -suspect infiltrate seen on CXR not PNA but is emboli with likely surrounding infarct  -Continue antibiotics and supportive care  -Influenza negative  -urinary strep and legionella neg  -WBC trend down finally  -consider repeating CT scan and consult ENT if patient worsens- CT scan of neck with contrast  Left neck pain/swelling 2/2 septic thrombophlebitis  - due to septic thrombophlebitis (non occlusive) of the IJ in setting of acute pharyngitis/tonsilitis with multiple septic pulmonary embolization (Lemierre syndrome)  -consult ID--appreciate Dr. Megan Salon -UptoDate rec anticoagulation only in setting of extensive phlebitis  -cont prn Oxycodone / IV MSO4/IV Toradol (with Pepcid)  -Patient will be sent home with oxycodone IR, 5 mg every 4 hours when necessary pain, #40,  no refills Microaerophilic streptococci bacteremia  - ID following  -repeat blood cx's obtained 2/11--remains negative -Patient will go home with levofloxacin 750 mg for 6 additional days as per ID recommendations  Hypokalemia  -Replete prn  Right elbow pain/right knee pain  -There is point tenderness noted at the lateral aspect of the right elbow joint but no current erythema or appreciable effusion - there is concern of course that this joint could have been seeded with the patient's bacteremia - we will continue antibiotics as noted above - should the patient develop an effusion or erythema of the joint or should the pain not improve with ongoing antibiotic therapy we will obtain an MRI of the joint and consider Orthopedic consultation  Bacteriuria -> 100,000 colonies multiple morphotypes   -No significant pyuria to suggest UTI Acute kidney injury  -resolved after hydration  Anemia  -acute disease with dilution  -no apparent blood loss  As < 7, will give 1 unit PRBC  Dehydration with hyponatremia  -Sodium increased with rehydration  -positive 5700 cc so decreased IVFs KVO  -oral intake improved  -cont supplements with resource and/or boost  Lactic acidosis  -Secondary to low perfusion from sepsis - resolved Thrush  -nystatin -Resolved   Discharge Condition: Stable  Disposition:  discharge home  Diet: Regular Wt Readings from Last 3 Encounters:  09/03/13 105.3 kg (232 lb 2.3 oz)  12/18/12 94.802 kg (209 lb)  11/25/12 96.344 kg (212 lb 6.4 oz)  Brief narrative:  38 year old female patient with no significant past medical history. Endorsed cough and shortness of breath for one week. Was initially treated at urgent care and was prescribed a prednisone  taper but did not see any improvement in her symptoms. Her symptoms actually worsened to the point she could not even walk to the bathroom without significant dyspnea. She also noticed fevers 3 days prior to her presentation to  [facility]. Upon arrival to the emergency department patient was not hypoxic but had significant tachypnea and was tachycardic with a heart rate of 142. She was given fluid challenges. In addition her sodium was 128 with potassium of 2.5 and a bicarbonate of 17. Her BUN was 43 and creatinine was 1.9 with a lactic acid of 3.6 and white count of 27,000. Chest x-ray revealed infiltrate right middle lung.    Consultants: ID--Dr. Megan Salon  Discharge Exam: Filed Vitals:   09/04/13 0835  BP: 151/103  Pulse: 117  Temp: 98.3 F (36.8 C)  Resp: 19   Filed Vitals:   09/03/13 1845 09/03/13 2141 09/04/13 0524 09/04/13 0835  BP: 146/100 122/85 144/93 151/103  Pulse: 118 108 116 117  Temp: 99.7 F (37.6 C) 99.4 F (37.4 C) 99.5 F (37.5 C) 98.3 F (36.8 C)  TempSrc:  Oral Oral Oral  Resp: 23 18 18 19   Height:      Weight:  105.3 kg (232 lb 2.3 oz)    SpO2: 98% 96% 95% 94%   General: A&O x 3, NAD, pleasant, cooperative Cardiovascular: RRR, no rub, no gallop, no S3 Respiratory: CTAB, no wheeze, no rhonchi Abdomen:soft, nontender, nondistended, positive bowel sounds Extremities: No edema, No lymphangitis, no petechiae  Discharge Instructions      Discharge Orders   Future Appointments Provider Department Dept Phone   09/10/2013 11:15 AM Michel Bickers, MD [facility] [phone]   Future Orders Complete By Expires   Diet - low sodium heart healthy  As directed    Increase activity slowly  As directed        Medication List    STOP taking these medications       clarithromycin 250 MG/5ML suspension  Commonly known as:  BIAXIN     prednisoLONE 5 MG Tabs tablet      TAKE these medications       levofloxacin 750 MG tablet  Commonly known as:  LEVAQUIN  Take 1 tablet (750 mg total) by mouth daily. Start 09/05/13     oxyCODONE 5 MG immediate release tablet  Commonly known as:  Oxy IR/ROXICODONE  Take 1 tablet (5 mg total) by  mouth every 4 (four) hours as needed for moderate pain.         The results of significant diagnostics from this hospitalization (including imaging, microbiology, ancillary and laboratory) are listed below for reference.    Significant Diagnostic Studies: Dg Chest 2 View (if Patient Has Fever And/or Copd)  08/25/2013   CLINICAL DATA:  Chest pain and shortness of breath  EXAM: CHEST  2 VIEW  COMPARISON:  11/24/2012  FINDINGS: Cardiac shadow is within normal limits. The lungs are well aerated but a right middle lobe pneumonia is seen laterally. No other focal abnormality is seen. The  IMPRESSION: Right middle lobe pneumonia.   Electronically Signed   By: Inez Catalina M.D.   On: 08/25/2013 12:02   Ct Soft Tissue Neck W Contrast  08/27/2013   CLINICAL DATA:  Pain and difficulty swallowing on the left side of the neck with fever. Question neck abscess.  EXAM: CT NECK WITH CONTRAST  TECHNIQUE: Multidetector CT imaging of the neck was performed using the standard protocol following  the bolus administration of intravenous contrast.  CONTRAST:  61mL OMNIPAQUE IOHEXOL 300 MG/ML  SOLN  COMPARISON:  10/27/2012  FINDINGS: The visualized portion the brain is unremarkable. Orbits are normal in appearance. Mild bilateral frontal and ethmoid sinus mucosal thickening is present. Mucous retention cysts are noted in the left sphenoid and right maxillary sinuses, and there is trace left maxillary sinus mucosal thickening. Mastoid air cells are clear.  The nasopharynx is unremarkable. There is prominent asymmetric enlargement of the left palatine tonsil with mild surrounding inflammatory change. Mild retropharyngeal edema is present. Enhancement of the tonsillar soft tissues is mildly heterogeneous with some lower attenuation centrally but no discrete, organized fluid collection identified. Enlarged left level II lymph nodes measure up to 1.2 cm in short axis. Nonocclusive filling defects are present within the midportion of  the left internal jugular vein (series 2, image 64).  The submandibular and parotid glands are unremarkable. The thyroid is grossly unremarkable. The larynx is unremarkable.  Multiple rounded opacities are present in both lung apices, predominantly peripheral in location and measuring up to 1.6 cm in diameter in the right lung apex. The osseous structures are unremarkable.  IMPRESSION: Enlargement of the left palatine tonsil, consistent with tonsillitis, with nonocclusive left internal jugular vein thrombus and multiple pulmonary opacities compatible with septic emboli. Overall findings are consistent with Lemierre syndrome. There is some heterogeneous low attenuation within the tonsil and there is mild retropharyngeal edema, however no organized, drainable fluid collection is identified.  These results were called by telephone at the time of interpretation on 08/27/2013 at 3:14 PM to Dr. Wynelle Cleveland, who verbally acknowledged these results.   Electronically Signed   By: Logan Bores   On: 08/27/2013 15:15   Dg Chest Port 1 View  08/27/2013   CLINICAL DATA:  Right middle lobe pneumonia  EXAM: PORTABLE CHEST - 1 VIEW  COMPARISON:  August 25, 2013  FINDINGS: There is been partial but incomplete clearing of patchy infiltrate from the right middle lobe. Lungs elsewhere clear. Heart is mildly prominent with normal pulmonary vascularity. No adenopathy.  IMPRESSION: Partial but incomplete clearing of right middle lobe infiltrate. No new opacity.   Electronically Signed   By: Lowella Grip M.D.   On: 08/27/2013 08:26     Microbiology: Recent Results (from the past 240 hour(s))  RAPID STREP SCREEN     Status: None   Collection Time    08/25/13 12:28 PM      Result Value Ref Range Status   Streptococcus, Group A Screen (Direct) NEGATIVE  NEGATIVE Final   Comment: (NOTE)     A Rapid Antigen test may result negative if the antigen level in the     sample is below the detection level of this test. The FDA has not      cleared this test as a stand-alone test therefore the rapid antigen     negative result has reflexed to a Group A Strep culture.  CULTURE, GROUP A STREP     Status: None   Collection Time    08/25/13 12:28 PM      Result Value Ref Range Status   Specimen Description THROAT   Final   Special Requests NONE   Final   Culture     Final   Value: No Beta Hemolytic Streptococci Isolated     Performed at [facility]   Report Status 08/27/2013 FINAL   Final  URINE CULTURE     Status: None   Collection  Time    08/25/13 12:28 PM      Result Value Ref Range Status   Specimen Description URINE, RANDOM   Final   Special Requests NONE   Final   Culture  Setup Time     Final   Value: 08/25/2013 18:01     Performed at [place]     Final   Value: >=100,000 COLONIES/ML     Performed at [facility]   Culture     Final   Value: Multiple bacterial morphotypes present, none predominant. Suggest appropriate recollection if clinically indicated.     Performed at [facility]   Report Status 08/26/2013 FINAL   Final  MRSA PCR SCREENING     Status: None   Collection Time    08/25/13  3:52 PM      Result Value Ref Range Status   MRSA by PCR NEGATIVE  NEGATIVE Final   Comment:            The GeneXpert MRSA Assay (FDA     approved for NASAL specimens     only), is one component of a     comprehensive MRSA colonization     surveillance program. It is not     intended to diagnose MRSA     infection nor to guide or     monitor treatment for     MRSA infections.  CULTURE, BLOOD (ROUTINE X 2)     Status: None   Collection Time    08/25/13  4:04 PM      Result Value Ref Range Status   Specimen Description BLOOD RIGHT HAND   Final   Special Requests BOTTLES DRAWN AEROBIC AND ANAEROBIC 3CC   Final   Culture  Setup Time     Final   Value: 08/25/2013 22:01     Performed at [facility]   Culture     Final   Value: MICROAEROPHILIC  STREPTOCOCCI     Note: Standardized susceptibility testing for this organism is not available.     Note: Gram Stain Report Called to,Read Back By and Verified With: HEATHER RICHARD ON 08/26/2013 AT 9:48P BY Dennard Nip     Performed at [facility]   Report Status 08/29/2013 FINAL   Final  CULTURE, BLOOD (ROUTINE X 2)     Status: None   Collection Time    08/25/13  4:09 PM      Result Value Ref Range Status   Specimen Description BLOOD LEFT HAND   Final   Special Requests BOTTLES DRAWN AEROBIC ONLY 4CC   Final   Culture  Setup Time     Final   Value: 08/25/2013 21:59     Performed at [facility]   Culture     Final   Value: MICROAEROPHILIC STREPTOCOCCI     Note: Standardized susceptibility testing for this organism is not available.     Note: Gram Stain Report Called to,Read Back By and Verified With: Sharlyne Pacas [id number] BRMEL     Performed at [facility]   Report Status 08/29/2013 FINAL   Final  CULTURE, BLOOD (ROUTINE X 2)     Status: None   Collection Time    08/28/13  8:05 AM      Result Value Ref Range Status   Specimen Description BLOOD LEFT HAND   Final   Special Requests BOTTLES DRAWN AEROBIC ONLY 10CC   Final   Culture  Setup Time     Final   Value: 08/28/2013 14:27     Performed at [facility]   Culture     Final   Value: NO GROWTH 5 DAYS     Performed at [facility]   Report Status 09/03/2013 FINAL   Final  CULTURE, BLOOD (ROUTINE X 2)     Status: None   Collection Time    08/28/13  8:10 AM      Result Value Ref Range Status   Specimen Description BLOOD RIGHT HAND   Final   Special Requests BOTTLES DRAWN AEROBIC AND ANAEROBIC 10CC   Final   Culture  Setup Time     Final   Value: 08/28/2013 14:27     Performed at [facility]   Culture     Final   Value: NO GROWTH 5 DAYS     Performed at [facility]   Report Status 09/03/2013 FINAL   Final     Labs: Basic Metabolic Panel:  Recent Labs Lab  08/29/13 0251 08/30/13 0433 09/01/13 1915 09/02/13 0645 09/03/13 0405 09/04/13 0524  NA 133* 132* 136* 136* 137 134*  K 3.3* 4.1 4.1 4.2 4.5 4.2  CL 101 100 102 102 101 98  CO2 18* 20 22 24 25 25   GLUCOSE 122* 97 127* 95 92 101*  BUN 9 7 4* 4* 5* 5*  CREATININE 0.65 0.68 0.68 0.71 0.72 0.73  CALCIUM 7.4* 7.8* 8.4 8.8 8.7 9.0  MG 1.5 1.9  --   --   --   --   PHOS  --  3.2  --   --   --   --    Liver Function Tests: No results found for this basename: AST, ALT, ALKPHOS, BILITOT, PROT, ALBUMIN,  in the last 168 hours No results found for this basename: LIPASE, AMYLASE,  in the last 168 hours No results found for this basename: AMMONIA,  in the last 168 hours CBC:  Recent Labs Lab 08/31/13 0400 09/01/13 1915 09/02/13 0645 09/03/13 0405 09/04/13 0524  WBC 27.7* 29.1* 27.6* 25.8* 21.9*  HGB 7.6* 6.7* 6.7* 7.8* 8.3*  HCT 22.5* 18.9* 19.3* 22.7* 23.7*  MCV 80.1 81.5 82.1 84.4 85.3  PLT 372 485* 562* 568* 598*   Cardiac Enzymes: No results found for this basename: CKTOTAL, CKMB, CKMBINDEX, TROPONINI,  in the last 168 hours BNP: No components found with this basename: POCBNP,  CBG: No results found for this basename: GLUCAP,  in the last 168 hours  Time coordinating discharge:  Greater than 30 minutes  Signed:  Weslyn Holsonback, DO [facility] Pager: [phone] 09/04/2013, 10:25 AM

## 2013-09-04 NOTE — Progress Notes (Signed)
Patient discharged to home. Patient AVS reviewed with patient and patient's husband. Patient and husband verbalized understanding of medications and follow-up appointments.  Patient remains stable; no signs or symptoms of distress.  Patient educated to return to the ER in cases of SOB, dizziness, fever, chest pain, or fainting.

## 2013-09-10 ENCOUNTER — Ambulatory Visit
Admission: RE | Admit: 2013-09-10 | Discharge: 2013-09-10 | Disposition: A | Discharge status: Home / Self Care | Payer: 59 | Source: Ambulatory Visit | Attending: Internal Medicine | Admitting: Internal Medicine

## 2013-09-10 ENCOUNTER — Ambulatory Visit (INDEPENDENT_AMBULATORY_CARE_PROVIDER_SITE_OTHER): Payer: 59 | Admitting: Internal Medicine

## 2013-09-10 VITALS — Ht 69.0 in | Wt 202.2 lb

## 2013-09-10 DIAGNOSIS — I808 Phlebitis and thrombophlebitis of other sites: Secondary | ICD-10-CM

## 2013-09-10 DIAGNOSIS — N760 Acute vaginitis: Secondary | ICD-10-CM

## 2013-09-10 DIAGNOSIS — J029 Acute pharyngitis, unspecified: Secondary | ICD-10-CM

## 2013-09-10 MED ORDER — LEVOFLOXACIN 750 MG PO TABS: 750.0000 mg | ORAL_TABLET | Freq: Every day | ORAL | Status: DC

## 2013-09-10 MED ORDER — FLUCONAZOLE 100 MG PO TABS: 100.0000 mg | ORAL_TABLET | Freq: Every day | ORAL | Status: DC

## 2013-09-10 NOTE — Progress Notes (Signed)
Patient ID: Nicole Alexander, female   DOB: 12/16/1975, 38 y.o.   MRN: [id number]         [facility]  Patient Active Problem List   Diagnosis Date Noted  . 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 Elbow pain, right 08/27/2013    Priority: Medium  . Gram-positive bacteremia 08/27/2013    Priority: Medium  . Vaginitis 09/10/2013  . UTI (urinary tract infection) 08/27/2013  . Neck pain on left side 08/27/2013  . Sepsis 08/25/2013  . CAP (community acquired pneumonia) 08/25/2013  . AKI (acute kidney injury) 08/25/2013  . Dehydration with hyponatremia 08/25/2013  . High anion gap metabolic acidosis 94/85/4627  . Leukocytosis 08/25/2013    Patient's Medications  New Prescriptions   FLUCONAZOLE (DIFLUCAN) 100 MG TABLET    Take 1 tablet (100 mg total) by mouth daily.  Previous Medications   OXYCODONE (OXY IR/ROXICODONE) 5 MG IMMEDIATE RELEASE TABLET    Take 1 tablet (5 mg total) by mouth every 4 (four) hours as needed for moderate pain.   OXYCODONE (OXYCONTIN) 10 MG T12A 12 HR TABLET    Take 10 mg by mouth every 12 (twelve) hours.  Modified Medications   Modified Medication Previous Medication   LEVOFLOXACIN (LEVAQUIN) 750 MG TABLET levofloxacin (LEVAQUIN) 750 MG tablet      Take 1 tablet (750 mg total) by mouth daily. Start 09/05/13    Take 1 tablet (750 mg total) by mouth daily. Start 09/05/13  Discontinued Medications   No medications on file    Subjective: Ms. Sekula is here for her hospital followup visit. She was hospitalized earlier this month with severe pharyngitis, microaerophilic strep bacteremia in Lemiere syndrome with septic pulmonary emboli. She was discharged home on oral levofloxacin do to her penicillin allergy. She is now completed 16 days of therapy. She is feeling better but still has some discomfort on the left side of her neck. A CBC was repeated on February 19 white blood cell count was down to 18,400. She had some  right elbow pain in the hospital without any obvious effusion but she was seen recently and had some fluid drained from her right elbow. I called her microbiology lab but no specimen was there. She has not had any fever, chills or sweats. She still has some dry cough but no shortness of breath. She has some left rib pain when she coughs or takes a deep breath. She has developed some vaginal discharge, itching and burning.  Review of Systems: Pertinent items are noted in HPI.  No past medical history on file.  History  Substance Use Topics  . Smoking status: Former Smoker -- 0.50 packs/day  . Smokeless tobacco: Never Used  . Alcohol Use: Yes     Comment: occasional    Family History  Problem Relation Age of Onset  . Heart disease Mother     PTCA/stent  . Hypertension Mother   . Diabetes Mother   . Hyperlipidemia Mother   . Cancer Father     lung  . Hypertension Father   . Diabetes Father   . Hyperlipidemia Father   . Hypertension Brother   . Cancer Maternal Grandfather     bone    Allergies  Allergen Reactions  . Penicillins Swelling    "swole up like a balloon"    Objective:    General: She is in no distress and looks better than when I saw her just before discharge Skin: No  rash Oral: No oropharyngeal lesions Neck: Some tenderness along the left side without any obvious swelling or other signs of inflammation Lungs: Clear Cor: Regular S1 and S2 with no murmurs Abdomen: Soft and nontender Joints and extremities: No acute abnormalities noted   Assessment: She is improved but did have a very complicated course. I will recheck her chest x-ray today and extend her therapy with levofloxacin. I will also treat her empirically for probable candidal vaginitis.  Plan: 1. PA and lateral chest x-ray 2. Continue levofloxacin 3. Fluconazole 100 mg daily for 10 days 4. Followup on March 18   Michel Bickers, [place] [facility] [place]  [facility] [phone] pager   [phone] cell 09/10/2013, 11:40 AM

## 2013-10-02 ENCOUNTER — Ambulatory Visit (INDEPENDENT_AMBULATORY_CARE_PROVIDER_SITE_OTHER): Payer: 59 | Admitting: Internal Medicine

## 2013-10-02 ENCOUNTER — Encounter: Payer: Self-pay | Admitting: Internal Medicine

## 2013-10-02 VITALS — BP 124/84 | HR 81 | Temp 97.2°F | Wt 205.0 lb

## 2013-10-02 DIAGNOSIS — B955 Unspecified streptococcus as the cause of diseases classified elsewhere: Secondary | ICD-10-CM

## 2013-10-02 DIAGNOSIS — M25521 Pain in right elbow: Secondary | ICD-10-CM

## 2013-10-02 DIAGNOSIS — I808 Phlebitis and thrombophlebitis of other sites: Secondary | ICD-10-CM

## 2013-10-02 DIAGNOSIS — M25529 Pain in unspecified elbow: Secondary | ICD-10-CM

## 2013-10-02 DIAGNOSIS — A491 Streptococcal infection, unspecified site: Secondary | ICD-10-CM

## 2013-10-02 DIAGNOSIS — R7881 Bacteremia: Secondary | ICD-10-CM

## 2013-10-02 NOTE — Progress Notes (Signed)
Patient ID: Nicole Alexander, female   DOB: 05/29/1976, 38 y.o.   MRN: [id number]         [facility] for Infectious Disease  Patient Active Problem List   Diagnosis Date Noted  . 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 Elbow pain, right 08/27/2013    Priority: Medium  . Streptococcal bacteremia 08/27/2013    Priority: Medium  . Vaginitis 09/10/2013  . UTI (urinary tract infection) 08/27/2013  . Neck pain on left side 08/27/2013  . Sepsis 08/25/2013  . CAP (community acquired pneumonia) 08/25/2013  . AKI (acute kidney injury) 08/25/2013  . Dehydration with hyponatremia 08/25/2013  . High anion gap metabolic acidosis 73/22/0254  . Leukocytosis 08/25/2013    Patient's Medications  New Prescriptions   No medications on file  Previous Medications   FLUCONAZOLE (DIFLUCAN) 100 MG TABLET    Take 1 tablet (100 mg total) by mouth daily.   INDOMETHACIN (INDOCIN SR) 75 MG CR CAPSULE    Take 75 mg by mouth 2 (two) times daily with a meal.   LEVOFLOXACIN (LEVAQUIN) 750 MG TABLET    Take 1 tablet (750 mg total) by mouth daily. Start 09/05/13   OXYCODONE (OXY IR/ROXICODONE) 5 MG IMMEDIATE RELEASE TABLET    Take 1 tablet (5 mg total) by mouth every 4 (four) hours as needed for moderate pain.   OXYCODONE (OXYCONTIN) 10 MG T12A 12 HR TABLET    Take 10 mg by mouth every 12 (twelve) hours.  Modified Medications   No medications on file  Discontinued Medications   No medications on file    Subjective: Nicole Alexander is here for her hospital followup visit. She was hospitalized earlier this month with severe pharyngitis, microaerophilic strep bacteremia in Lemiere syndrome with septic pulmonary emboli. She was discharged home on oral levofloxacin do to her penicillin allergy. She has now completed 38 days of therapy. She is feeling better but still has some discomfort on the left side of her neck. She had some right elbow pain in the hospital without any obvious effusion but  she was seen recently and had some fluid drained from her right elbow. I called her microbiology lab but no specimen was there. She has not had any fever, chills or sweats. She still has some dry cough but no shortness of breath. A repeat chest x-ray done on February 24 showed some improvement in her nodule or infiltrates. Overall she is feeling better. She's not had any problems tolerating her antibiotic.  Review of Systems: Pertinent items are noted in HPI.  No past medical history on file.  History  Substance Use Topics  . Smoking status: Former Smoker -- 0.50 packs/day  . Smokeless tobacco: Never Used  . Alcohol Use: Yes     Comment: occasional    Family History  Problem Relation Age of Onset  . Heart disease Mother     PTCA/stent  . Hypertension Mother   . Diabetes Mother   . Hyperlipidemia Mother   . Cancer Father     lung  . Hypertension Father   . Diabetes Father   . Hyperlipidemia Father   . Hypertension Brother   . Cancer Maternal Grandfather     bone    Allergies  Allergen Reactions  . Penicillins Swelling    "swole up like a balloon"    Objective: Temp: 97.2 F (36.2 C) (03/18 1559) Temp src: Oral (03/18 1559) BP: 124/84 mmHg (03/18 1559) Pulse Rate: 81 (03/18 1559)  General: She is in no distress and looks better than when I saw her just before discharge Skin: No rash Oral: No oropharyngeal lesions Neck: Some tenderness along the left side without any obvious swelling or other signs of inflammation Lungs: Clear Cor: Regular S1 and S2 with no murmurs Abdomen: Soft and nontender Joints and extremities: No acute abnormalities noted   Assessment: She is improved but did have a very complicated course. I will have her complete her last few days of levofloxacin and then stop.  Plan: 1. Finish last few days of levofloxacin and then stop and observe off of antibiotics 2. Followup in one month   Michel Bickers, MD [facility] [place] [phone] pager   [phone] cell 10/02/2013, 4:22 PM

## 2013-11-05 ENCOUNTER — Ambulatory Visit (INDEPENDENT_AMBULATORY_CARE_PROVIDER_SITE_OTHER): Payer: 59 | Admitting: Internal Medicine

## 2013-11-05 ENCOUNTER — Encounter: Payer: Self-pay | Admitting: Internal Medicine

## 2013-11-05 VITALS — BP 137/93 | HR 90 | Temp 98.3°F | Ht 68.0 in | Wt 209.0 lb

## 2013-11-05 DIAGNOSIS — R7881 Bacteremia: Secondary | ICD-10-CM

## 2013-11-05 DIAGNOSIS — A491 Streptococcal infection, unspecified site: Secondary | ICD-10-CM

## 2013-11-05 DIAGNOSIS — B955 Unspecified streptococcus as the cause of diseases classified elsewhere: Secondary | ICD-10-CM

## 2013-11-05 NOTE — Progress Notes (Signed)
Patient ID: Nicole Alexander, female   DOB: March 20, 1976, 38 y.o.   MRN: [id number]         [facility] Infectious Disease  Patient Active Problem List   Diagnosis Date Noted  . 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 Elbow pain, right 08/27/2013    Priority: Medium  . Streptococcal bacteremia 08/27/2013    Priority: Medium  . Vaginitis 09/10/2013  . UTI (urinary tract infection) 08/27/2013  . Neck pain on left side 08/27/2013  . Sepsis 08/25/2013  . CAP (community acquired pneumonia) 08/25/2013  . AKI (acute kidney injury) 08/25/2013  . Dehydration with hyponatremia 08/25/2013  . High anion gap metabolic acidosis 97/67/3419  . Leukocytosis 08/25/2013    Patient's Medications  New Prescriptions   No medications on file  Previous Medications   INDOMETHACIN (INDOCIN SR) 75 MG CR CAPSULE    Take 75 mg by mouth 2 (two) times daily with a meal.  Modified Medications   No medications on file  Discontinued Medications   FLUCONAZOLE (DIFLUCAN) 100 MG TABLET    Take 1 tablet (100 mg total) by mouth daily.   LEVOFLOXACIN (LEVAQUIN) 750 MG TABLET    Take 1 tablet (750 mg total) by mouth daily. Start 09/05/13   OXYCODONE (OXY IR/ROXICODONE) 5 MG IMMEDIATE RELEASE TABLET    Take 1 tablet (5 mg total) by mouth every 4 (four) hours as needed for moderate pain.   OXYCODONE (OXYCONTIN) 10 MG T12A 12 HR TABLET    Take 10 mg by mouth every 12 (twelve) hours.    Subjective: Nicole Alexander is in for her routine followup visit. She is feeling much better. 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 She's not had any fever, chills, cough or shortness of breath. Her right elbow pain has resolved. She still has some very mild right elbow stiffness.  No past medical history on file.  History  Substance Use Topics  . Smoking status: Former Smoker -- 0.50 packs/day  . Smokeless  tobacco: Never Used  . Alcohol Use: Yes     Comment: occasional    Family History  Problem Relation Age of Onset  . Heart disease Mother     PTCA/stent  . Hypertension Mother   . Diabetes Mother   . Hyperlipidemia Mother   . Cancer Father     lung  . Hypertension Father   . Diabetes Father   . Hyperlipidemia Father   . Hypertension Brother   . Cancer Maternal Grandfather     bone    Allergies  Allergen Reactions  . Penicillins Swelling    "swole up like a balloon"    Objective: Temp: 98.3 F (36.8 C) (04/21 0901) BP: 137/93 mmHg (04/21 0901) Pulse Rate: 90 (04/21 0901)  General: She is in good spirits Skin: No rash Lungs: Clear Cor: Regular S1-S2 with no murmurs Left elbow: Normal without any evidence of active inflammation    Assessment: Her bacteremia and Lemmiere syndrome have been cured.  Plan: 1. Continue observation off of antibiotics 2. Followup here as needed   Michel Bickers, MD [facility] [place] [phone] pager   [phone] cell 11/05/2013, 9:09 AM

## 2015-01-12 ENCOUNTER — Telehealth: Payer: Self-pay | Admitting: Hematology and Oncology

## 2015-01-12 NOTE — Telephone Encounter (Signed)
new patient appt-s/w patient and gave np appt for 07/26 @ 3:15 w/Dr. Lindi Adie Referring dr. Antony Blackbird Dx-Mild Leukopenia

## 2015-02-10 ENCOUNTER — Other Ambulatory Visit: Payer: Self-pay

## 2015-02-10 ENCOUNTER — Telehealth: Payer: Self-pay | Admitting: Hematology and Oncology

## 2015-02-10 ENCOUNTER — Ambulatory Visit (HOSPITAL_BASED_OUTPATIENT_CLINIC_OR_DEPARTMENT_OTHER): Payer: 59 | Admitting: Hematology and Oncology

## 2015-02-10 ENCOUNTER — Encounter: Payer: Self-pay | Admitting: Hematology and Oncology

## 2015-02-10 ENCOUNTER — Ambulatory Visit: Payer: 59

## 2015-02-10 DIAGNOSIS — Z87891 Personal history of nicotine dependence: Secondary | ICD-10-CM | POA: Diagnosis not present

## 2015-02-10 DIAGNOSIS — Z808 Family history of malignant neoplasm of other organs or systems: Secondary | ICD-10-CM

## 2015-02-10 DIAGNOSIS — D72829 Elevated white blood cell count, unspecified: Secondary | ICD-10-CM

## 2015-02-10 NOTE — Assessment & Plan Note (Signed)
Leukocytosis primarily neutrophilia and mild lymphocytosis. Blood work done in June 2016 revealed a white count of 14.5 with a neutrophil count of 9.6 and a lymphocyte count of 4.6. I discussed with the patient the differential diagnosis of leukocytosis primarily neutrophilia as being between infection and inflammation. It is extremely rare for myeloproliferative disorders to cause primarily neutrophilia.patient has extensive history of ear infections and inflammations from 2014 as well as into 2015. Most recently she continued to have painful lymphadenopathy in the left side of the neck. All of these are potential sources of infection/inflammation.  Recommendation: Flow cytometry as well as blood smear evaluation If these tests are normal then no further workup is necessary If these are abnormal then I would like to get a bone marrow biopsy for further evaluation.  I will call the patient with the results of these tests. Patient will see [place] back as needed.

## 2015-02-10 NOTE — Telephone Encounter (Signed)
Appointments made and avs printed for patient °

## 2015-02-10 NOTE — Progress Notes (Signed)
Checked in new pt with no financial concerns. °

## 2015-02-10 NOTE — Progress Notes (Signed)
[facility] CONSULT NOTE  Patient Care Team: Antony Blackbird, MD as PCP - General (Family Medicine)  CHIEF COMPLAINTS/PURPOSE OF CONSULTATION:  leukocytosis  HISTORY OF PRESENTING ILLNESS:  Nicole Alexander 39 y.o. female is here because PERSISTENT leukocytosis. Patient has a history of Lemierres  Syndrome which basically means she has suppurative thrombophlebitis. It appears that started in the left side of the neck in February 2015 and she was asked hospitalized extensively. She was severely anemic during that timeframe and had to be given blood transfusions as well. She was on broad-spectrum antibiotics for extended period of time until her symptoms resolve. Prior to that in 2014 she had pancreatitis. Most recently she was to have swollen left cervical lymph nodes with some inflammation underlying it. Blood work performed in the office at [facility] revealed leukocytosis with a white count of 15.7 with a neutrophil count of 9.8 and lymphocyte count of 4.5. This suggests primarily neutrophilia and very mild lymphocytosis.  I reviewed her records extensively and collaborated the history with the patient.  MEDICAL HISTORY:  History reviewed. No pertinent past medical history.  SURGICAL HISTORY: Past Surgical History  Procedure Laterality Date  . Breast surgery      breast reduction  . Cholecystectomy N/A 11/28/2012    Procedure: LAPAROSCOPIC CHOLECYSTECTOMY WITH INTRAOPERATIVE CHOLANGIOGRAM;  Surgeon: Zenovia Jarred, MD;  Location: [facility];  Service: General;  Laterality: N/A;    SOCIAL HISTORY: History   Social History  . Marital Status: Married    Spouse Name: N/A  . Number of Children: N/A  . Years of Education: N/A   Occupational History  . Not on file.   Social History Main Topics  . Smoking status: Former Smoker -- 0.50 packs/day for 4 years  . Smokeless tobacco: Never Used  . Alcohol Use: Yes     Comment: occasional  . Drug Use: No  . Sexual  Activity: Yes     Comment: married since 12-2003   Other Topics Concern  . Not on file   Social History Narrative    FAMILY HISTORY: Family History  Problem Relation Age of Onset  . Heart disease Mother     PTCA/stent  . Hypertension Mother   . Diabetes Mother   . Hyperlipidemia Mother   . Cancer Father     lung  . Hypertension Father   . Diabetes Father   . Hyperlipidemia Father   . Hypertension Brother   . Cancer Maternal Grandfather     bone    ALLERGIES:  is allergic to penicillins.  MEDICATIONS:  Current Outpatient Prescriptions  Medication Sig Dispense Refill  . Vitamin D, Ergocalciferol, (DRISDOL) 50000 UNITS CAPS capsule TAKE ONE CAPSULE BY MOUTH TWICE WEEKLY  0   No current facility-administered medications for this visit.    REVIEW OF SYSTEMS:   Constitutional: Denies fevers, chills or abnormal night sweats Eyes: Denies blurriness of vision, double vision or watery eyes Ears, nose, mouth, throat, and face: Denies mucositis or sore throat Respiratory: Denies cough, dyspnea or wheezes Cardiovascular: Denies palpitation, chest discomfort or lower extremity swelling Gastrointestinal:  Denies nausea, heartburn or change in bowel habits Skin: Denies abnormal skin rashes Lymphatics: painful left neck swelling, I could not feel any lymphadenopathy there. Neurological:Denies numbness, tingling or new weaknesses Behavioral/Psych: Mood is stable, no new changes  All other systems were reviewed with the patient and are negative.  PHYSICAL EXAMINATION: ECOG PERFORMANCE STATUS: 1 - Symptomatic but completely ambulatory  Filed Vitals:  02/10/15 1525  BP: 138/92  Pulse: 105  Temp: 98.1 F (36.7 C)  Resp: 18   Filed Weights   02/10/15 1525  Weight: 243 lb 14.4 oz (110.632 kg)    GENERAL:alert, no distress and comfortable SKIN: skin color, texture, turgor are normal, no rashes or significant lesions EYES: normal, conjunctiva are pink and non-injected,  sclera clear OROPHARYNX:no exudate, no erythema and lips, buccal mucosa, and tongue normal  NECK: supple, thyroid normal size, non-tender, without nodularity LYMPH:  Tenderness in the left side of the neck but no palpable lymphadenopathy LUNGS: clear to auscultation and percussion with normal breathing effort HEART: regular rate & rhythm and no murmurs and no lower extremity edema ABDOMEN:abdomen soft, non-tender and normal bowel sounds Musculoskeletal:no cyanosis of digits and no clubbing  PSYCH: alert & oriented x 3 with fluent speech NEURO: no focal motor/sensory deficits  LABORATORY DATA:  I have reviewed the data as listed Lab Results  Component Value Date   WBC 21.9* 09/04/2013   HGB 8.3* 09/04/2013   HCT 23.7* 09/04/2013   MCV 85.3 09/04/2013   PLT 598* 09/04/2013   Lab Results  Component Value Date   NA 134* 09/04/2013   K 4.2 09/04/2013   CL 98 09/04/2013   CO2 25 09/04/2013    ASSESSMENT AND PLAN:  Leukocytosis Leukocytosis primarily neutrophilia and mild lymphocytosis. Blood work done in June 2016 revealed a white count of 15.7 with a neutrophil count of 9.8 and a lymphocyte count of 4.5 I discussed with the patient the differential diagnosis of leukocytosis primarily neutrophilia as being between infection and inflammation. It is extremely rare for myeloproliferative disorders to cause primarily neutrophilia.patient has extensive history of ear infections and inflammations from 2014 as well as into 2015. Most recently she continued to have painful lymphadenopathy in the left side of the neck. All of these are potential sources of infection/inflammation.  Recommendation: Flow cytometry as well as blood smear evaluation If these tests are normal then no further workup is necessary If these are abnormal then I would like to get a bone marrow biopsy for further evaluation.  I will call the patient with the results of these tests. Patient will see [place] back as  needed.   All questions were answered. The patient knows to call the clinic with any problems, questions or concerns.    Rulon Eisenmenger, MD 4:31 PM

## 2015-02-13 ENCOUNTER — Other Ambulatory Visit (HOSPITAL_COMMUNITY)
Admission: RE | Admit: 2015-02-13 | Discharge: 2015-02-13 | Disposition: A | Discharge status: Home / Self Care | Payer: 59 | Source: Ambulatory Visit | Attending: Hematology and Oncology | Admitting: Hematology and Oncology

## 2015-02-13 ENCOUNTER — Other Ambulatory Visit (HOSPITAL_BASED_OUTPATIENT_CLINIC_OR_DEPARTMENT_OTHER): Payer: 59

## 2015-02-13 DIAGNOSIS — D72829 Elevated white blood cell count, unspecified: Secondary | ICD-10-CM | POA: Diagnosis not present

## 2015-02-13 LAB — CBC WITH DIFFERENTIAL/PLATELET
BASO%: 0.2 % (ref 0.0–2.0)
Basophils Absolute: 0 10*3/uL (ref 0.0–0.1)
EOS ABS: 0.5 10*3/uL (ref 0.0–0.5)
EOS%: 3.1 % (ref 0.0–7.0)
HEMATOCRIT: 33.6 % — AB (ref 34.8–46.6)
HGB: 11.3 g/dL — ABNORMAL LOW (ref 11.6–15.9)
LYMPH%: 33.8 % (ref 14.0–49.7)
MCH: 29 pg (ref 25.1–34.0)
MCHC: 33.6 g/dL (ref 31.5–36.0)
MCV: 86.2 fL (ref 79.5–101.0)
MONO#: 0.9 10*3/uL (ref 0.1–0.9)
MONO%: 5.4 % (ref 0.0–14.0)
NEUT#: 9.2 10*3/uL — ABNORMAL HIGH (ref 1.5–6.5)
NEUT%: 57.5 % (ref 38.4–76.8)
PLATELETS: 317 10*3/uL (ref 145–400)
RBC: 3.9 10*6/uL (ref 3.70–5.45)
RDW: 13.5 % (ref 11.2–14.5)
WBC: 16 10*3/uL — ABNORMAL HIGH (ref 3.9–10.3)
lymph#: 5.4 10*3/uL — ABNORMAL HIGH (ref 0.9–3.3)

## 2015-02-13 LAB — TECHNOLOGIST REVIEW

## 2015-02-17 LAB — FLOW CYTOMETRY

## 2015-11-09 ENCOUNTER — Inpatient Hospital Stay (HOSPITAL_COMMUNITY)
Admission: EM | Admit: 2015-11-09 | Discharge: 2015-11-15 | DRG: 855 | Disposition: A | Discharge status: Home / Self Care | Payer: 59 | Attending: Internal Medicine | Admitting: Internal Medicine

## 2015-11-09 ENCOUNTER — Emergency Department (HOSPITAL_COMMUNITY): Payer: 59

## 2015-11-09 ENCOUNTER — Encounter (HOSPITAL_COMMUNITY): Payer: Self-pay

## 2015-11-09 DIAGNOSIS — D649 Anemia, unspecified: Secondary | ICD-10-CM | POA: Diagnosis present

## 2015-11-09 DIAGNOSIS — E876 Hypokalemia: Secondary | ICD-10-CM | POA: Diagnosis present

## 2015-11-09 DIAGNOSIS — R768 Other specified abnormal immunological findings in serum: Secondary | ICD-10-CM | POA: Insufficient documentation

## 2015-11-09 DIAGNOSIS — R7689 Other specified abnormal immunological findings in serum: Secondary | ICD-10-CM | POA: Insufficient documentation

## 2015-11-09 DIAGNOSIS — Z87891 Personal history of nicotine dependence: Secondary | ICD-10-CM

## 2015-11-09 DIAGNOSIS — M542 Cervicalgia: Secondary | ICD-10-CM | POA: Diagnosis present

## 2015-11-09 DIAGNOSIS — Z880 Allergy status to penicillin: Secondary | ICD-10-CM

## 2015-11-09 DIAGNOSIS — A419 Sepsis, unspecified organism: Secondary | ICD-10-CM | POA: Diagnosis not present

## 2015-11-09 DIAGNOSIS — R590 Localized enlarged lymph nodes: Secondary | ICD-10-CM | POA: Insufficient documentation

## 2015-11-09 DIAGNOSIS — E041 Nontoxic single thyroid nodule: Secondary | ICD-10-CM | POA: Diagnosis present

## 2015-11-09 DIAGNOSIS — R509 Fever, unspecified: Secondary | ICD-10-CM | POA: Insufficient documentation

## 2015-11-09 DIAGNOSIS — L040 Acute lymphadenitis of face, head and neck: Secondary | ICD-10-CM | POA: Diagnosis present

## 2015-11-09 LAB — COMPREHENSIVE METABOLIC PANEL
ALT: 41 U/L (ref 14–54)
ANION GAP: 13 (ref 5–15)
AST: 30 U/L (ref 15–41)
Albumin: 3.3 g/dL — ABNORMAL LOW (ref 3.5–5.0)
Alkaline Phosphatase: 42 U/L (ref 38–126)
BUN: <5 mg/dL — ABNORMAL LOW (ref 6–20)
CALCIUM: 8.7 mg/dL — AB (ref 8.9–10.3)
CHLORIDE: 103 mmol/L (ref 101–111)
CO2: 21 mmol/L — AB (ref 22–32)
Creatinine, Ser: 0.77 mg/dL (ref 0.44–1.00)
GFR calc non Af Amer: >60 mL/min (ref >60)
Glucose, Bld: 92 mg/dL (ref 65–99)
Potassium: 3.6 mmol/L (ref 3.5–5.1)
SODIUM: 137 mmol/L (ref 135–145)
Total Bilirubin: 0.3 mg/dL (ref 0.3–1.2)
Total Protein: 7.2 g/dL (ref 6.5–8.1)

## 2015-11-09 LAB — URINALYSIS, ROUTINE W REFLEX MICROSCOPIC
BILIRUBIN URINE: NEGATIVE
Glucose, UA: NEGATIVE mg/dL
HGB URINE DIPSTICK: NEGATIVE
KETONES UR: NEGATIVE mg/dL
Nitrite: NEGATIVE
PH: 6.5 (ref 5.0–8.0)
Protein, ur: NEGATIVE mg/dL
SPECIFIC GRAVITY, URINE: 1.008 (ref 1.005–1.030)

## 2015-11-09 LAB — CBC WITH DIFFERENTIAL/PLATELET
BASOS PCT: 0 %
Basophils Absolute: 0 10*3/uL (ref 0.0–0.1)
Eosinophils Absolute: 0 10*3/uL (ref 0.0–0.7)
Eosinophils Relative: 1 %
HEMATOCRIT: 33.2 % — AB (ref 36.0–46.0)
HEMOGLOBIN: 10.9 g/dL — AB (ref 12.0–15.0)
LYMPHS ABS: 1.7 10*3/uL (ref 0.7–4.0)
Lymphocytes Relative: 33 %
MCH: 27 pg (ref 26.0–34.0)
MCHC: 32.8 g/dL (ref 30.0–36.0)
MCV: 82.2 fL (ref 78.0–100.0)
MONOS PCT: 11 %
Monocytes Absolute: 0.6 10*3/uL (ref 0.1–1.0)
NEUTROS PCT: 55 %
Neutro Abs: 2.9 10*3/uL (ref 1.7–7.7)
Platelets: 222 10*3/uL (ref 150–400)
RBC: 4.04 MIL/uL (ref 3.87–5.11)
RDW: 13.8 % (ref 11.5–15.5)
WBC: 5.2 10*3/uL (ref 4.0–10.5)

## 2015-11-09 LAB — URINE MICROSCOPIC-ADD ON

## 2015-11-09 LAB — I-STAT CG4 LACTIC ACID, ED: LACTIC ACID, VENOUS: 1.38 mmol/L (ref 0.5–2.0)

## 2015-11-09 MED ORDER — DIPHENHYDRAMINE HCL 50 MG/ML IJ SOLN
25.0000 mg | Freq: Once | INTRAMUSCULAR | Status: AC
Start: 2015-11-09 — End: 2015-11-09
  Administered 2015-11-09: 25 mg via INTRAVENOUS
  Filled 2015-11-09: qty 1

## 2015-11-09 MED ORDER — IOPAMIDOL (ISOVUE-300) INJECTION 61%
INTRAVENOUS | Status: AC
  Administered 2015-11-09: 75 mL
  Filled 2015-11-09: qty 75

## 2015-11-09 MED ORDER — MORPHINE SULFATE (PF) 4 MG/ML IV SOLN
4.0000 mg | Freq: Once | INTRAVENOUS | Status: AC
  Administered 2015-11-09: 4 mg via INTRAVENOUS
  Filled 2015-11-09: qty 1

## 2015-11-09 MED ORDER — ACETAMINOPHEN 325 MG PO TABS
650.0000 mg | ORAL_TABLET | Freq: Once | ORAL | Status: AC
  Administered 2015-11-09: 650 mg via ORAL
  Filled 2015-11-09: qty 2

## 2015-11-09 MED ORDER — SODIUM CHLORIDE 0.9 % IV BOLUS (SEPSIS)
1000.0000 mL | Freq: Once | INTRAVENOUS | Status: AC
  Administered 2015-11-09: 1000 mL via INTRAVENOUS

## 2015-11-09 MED ORDER — SODIUM CHLORIDE 0.9 % IV BOLUS (SEPSIS): 1000.0000 mL | Freq: Once | INTRAVENOUS | Status: DC

## 2015-11-09 NOTE — ED Provider Notes (Signed)
CSN: [id number]     Arrival date & time 11/09/15  1622 History  By signing my name below, I, [facility], attest that this documentation has been prepared under the direction and in the presence of Gloriann Loan, PA-C. Electronically Signed: Virgel Bouquet, ED Scribe. 11/09/2015. 6:15 PM.    Chief Complaint  Patient presents with  . Fever    The history is provided by the patient. No language interpreter was used.   HPI Comments: Nicole Alexander is a 40 y.o. female who presents to the Emergency Department complaining of constant, waxing and waning fever TMAX 101 onset 6 days ago. She states that her painful swelling on the right side of her neck near her lymph nodes that radiates into her right ear and cough. Patient reports that she has been seen twice for these symptoms in the past week, one by urgent care where she was prescribed azithromycin and once by her PCP earlier today who advised her to present to the ED today given her past history of Lemierre syndrome in 2015.  She has finished this course of antibiotics and taken Aleve (x8 daily) without relief. She notes similar symptoms in 2015 when she was seen and admitted to [facility] for 10 days with pneumonia and Lemierre's Syndrome. Denies PICC line. Denies nausea, vomiting, abdominal pain, neck stiffness.  History reviewed. No pertinent past medical history. Past Surgical History  Procedure Laterality Date  . Breast surgery      breast reduction  . Cholecystectomy N/A 11/28/2012    Procedure: LAPAROSCOPIC CHOLECYSTECTOMY WITH INTRAOPERATIVE CHOLANGIOGRAM;  Surgeon: Zenovia Jarred, MD;  Location: [facility];  Service: General;  Laterality: N/A;   Family History  Problem Relation Age of Onset  . Heart disease Mother     PTCA/stent  . Hypertension Mother   . Diabetes Mother   . Hyperlipidemia Mother   . Cancer Father     lung  . Hypertension Father   . Diabetes Father   . Hyperlipidemia Father   . Hypertension Brother   .  Cancer Maternal Grandfather     bone   Social History  Substance Use Topics  . Smoking status: Former Smoker -- 0.50 packs/day for 4 years  . Smokeless tobacco: Never Used  . Alcohol Use: Yes     Comment: occasional   OB History    No data available     Review of Systems  Constitutional: Positive for fever.  Respiratory: Positive for cough.   Gastrointestinal: Negative for nausea, vomiting and abdominal pain.  Musculoskeletal: Positive for neck pain. Negative for neck stiffness.  All other systems reviewed and are negative.   Allergies  Penicillins  Home Medications   Prior to Admission medications   Medication Sig Start Date End Date Taking? Authorizing Provider  Vitamin D, Ergocalciferol, (DRISDOL) 50000 UNITS CAPS capsule TAKE ONE CAPSULE BY MOUTH TWICE WEEKLY 12/10/14   Historical Provider, MD   BP 130/95 mmHg  Pulse 102  Temp(Src) 100 F (37.8 C) (Oral)  Resp 16  Ht 5\' 9"  (1.753 m)  Wt 247 lb (112.038 kg)  BMI 36.46 kg/m2  SpO2 100% Physical Exam  Constitutional: She is oriented to person, place, and time. She appears well-developed and well-nourished.  Non-toxic appearance. She has a sickly appearance. She does not appear ill.  HENT:  Head: Normocephalic and atraumatic.  Mouth/Throat: Uvula is midline, oropharynx is clear and moist and mucous membranes are normal. There is trismus (mild) in the jaw. No oropharyngeal exudate, posterior oropharyngeal  edema, posterior oropharyngeal erythema or tonsillar abscesses.  Enlarged right sided submandibular lymph node exquisitely TTP.  No overlying erythema, warmth, or induration.  Limited cervical ROM in lateral flexion due to pain.   Eyes: Conjunctivae are normal. Pupils are equal, round, and reactive to light.  Neck: Normal range of motion. Neck supple.  Cardiovascular: Regular rhythm and normal heart sounds.  Tachycardia present.   No murmur heard. HR 102  Pulmonary/Chest: Effort normal and breath sounds normal. No  accessory muscle usage or stridor. No respiratory distress. She has no wheezes. She has no rhonchi. She has no rales.  Abdominal: Soft. Bowel sounds are normal. She exhibits no distension. There is no tenderness.  Musculoskeletal: Normal range of motion.  Lymphadenopathy:    She has no cervical adenopathy.  Neurological: She is alert and oriented to person, place, and time.  Speech clear without dysarthria.  Skin: Skin is warm and dry.  Psychiatric: She has a normal mood and affect. Her behavior is normal.    ED Course  Procedures   DIAGNOSTIC STUDIES: Oxygen Saturation is 100% on RA, normal by my interpretation.    COORDINATION OF CARE: 5:49 PM Will order labs, neck CT, pain medication, and IV fluids. Discussed treatment plan with pt at bedside and pt agreed to plan.   Labs Review Labs Reviewed  COMPREHENSIVE METABOLIC PANEL  CBC WITH DIFFERENTIAL/PLATELET  I-STAT CG4 LACTIC ACID, ED    Imaging Review No results found. I have personally reviewed and evaluated these images and lab results as part of my medical decision-making.   EKG Interpretation None      MDM   Final diagnoses:  None   Patient presents with enlarged submandibular R lymph node.  Assoc sxs include cough and fever, max temp 101.  Patient admitted in 2015 for Lemierre syndrome, and she reports similar presentation.  On arrival, temp 100, mild tachycardia with HR 102.  On exam, patient appears sickly.  Enlarged right submandibular LN with decreased ROM in cervical lateral flexion.  No overlying cellulitis.  Mild trismus.  Labs, imaging, IVF, and pain medication ordered.  Patient transferred to the acute side, pod E, for further evaluation and care.  I personally performed the services described in this documentation, which was scribed in my presence. The recorded information has been reviewed and is accurate.    Gloriann Loan, PA-C 11/09/15 1833  Forde Dandy, MD 11/10/15 [id number]  Forde Dandy,  MD 11/10/15 [phone]

## 2015-11-09 NOTE — ED Notes (Signed)
Pt offered a shot of pain medication im while waiting for iv team and she requested to wait until she has her iv

## 2015-11-09 NOTE — ED Notes (Signed)
Rn attempted to obtain IV.

## 2015-11-09 NOTE — ED Notes (Signed)
Patient here with ongoing fever x 6 days. Seen by her primary MD for same and took z-pack as prescribed. Now has swollen lymph node to right neck. No distress

## 2015-11-09 NOTE — ED Notes (Signed)
Unable to get iv on pt, iv team to consult, er md notified

## 2015-11-10 DIAGNOSIS — A419 Sepsis, unspecified organism: Secondary | ICD-10-CM | POA: Diagnosis not present

## 2015-11-10 DIAGNOSIS — L040 Acute lymphadenitis of face, head and neck: Secondary | ICD-10-CM | POA: Diagnosis not present

## 2015-11-10 DIAGNOSIS — M542 Cervicalgia: Secondary | ICD-10-CM | POA: Diagnosis present

## 2015-11-10 DIAGNOSIS — E041 Nontoxic single thyroid nodule: Secondary | ICD-10-CM | POA: Diagnosis present

## 2015-11-10 DIAGNOSIS — D649 Anemia, unspecified: Secondary | ICD-10-CM | POA: Diagnosis present

## 2015-11-10 LAB — BASIC METABOLIC PANEL
ANION GAP: 12 (ref 5–15)
BUN: <5 mg/dL — ABNORMAL LOW (ref 6–20)
CHLORIDE: 103 mmol/L (ref 101–111)
CO2: 18 mmol/L — ABNORMAL LOW (ref 22–32)
Calcium: 7.9 mg/dL — ABNORMAL LOW (ref 8.9–10.3)
Creatinine, Ser: 0.74 mg/dL (ref 0.44–1.00)
GFR calc Af Amer: >60 mL/min (ref >60)
GLUCOSE: 97 mg/dL (ref 65–99)
POTASSIUM: 3.3 mmol/L — AB (ref 3.5–5.1)
Sodium: 133 mmol/L — ABNORMAL LOW (ref 135–145)

## 2015-11-10 LAB — CBC
HEMATOCRIT: 30.8 % — AB (ref 36.0–46.0)
HEMOGLOBIN: 10.2 g/dL — AB (ref 12.0–15.0)
MCH: 27.3 pg (ref 26.0–34.0)
MCHC: 33.1 g/dL (ref 30.0–36.0)
MCV: 82.6 fL (ref 78.0–100.0)
PLATELETS: 177 10*3/uL (ref 150–400)
RBC: 3.73 MIL/uL — AB (ref 3.87–5.11)
RDW: 14 % (ref 11.5–15.5)
WBC: 4.4 10*3/uL (ref 4.0–10.5)

## 2015-11-10 LAB — RAPID STREP SCREEN (MED CTR MEBANE ONLY): Streptococcus, Group A Screen (Direct): NEGATIVE

## 2015-11-10 LAB — IRON AND TIBC
Iron: 17 ug/dL — ABNORMAL LOW (ref 28–170)
SATURATION RATIOS: 7 % — AB (ref 10.4–31.8)
TIBC: 235 ug/dL — AB (ref 250–450)
UIBC: 218 ug/dL

## 2015-11-10 LAB — TSH: TSH: 4.447 u[IU]/mL (ref 0.350–4.500)

## 2015-11-10 LAB — MONONUCLEOSIS SCREEN: MONO SCREEN: NEGATIVE

## 2015-11-10 MED ORDER — ENOXAPARIN SODIUM 40 MG/0.4ML ~~LOC~~ SOLN
40.0000 mg | SUBCUTANEOUS | Status: DC
  Filled 2015-11-10: qty 0.4

## 2015-11-10 MED ORDER — HYDROCODONE-ACETAMINOPHEN 5-325 MG PO TABS
1.0000 | ORAL_TABLET | ORAL | Status: DC | PRN
  Administered 2015-11-10: 1 via ORAL
  Filled 2015-11-10: qty 1

## 2015-11-10 MED ORDER — POTASSIUM CHLORIDE CRYS ER 20 MEQ PO TBCR
40.0000 meq | EXTENDED_RELEASE_TABLET | Freq: Once | ORAL | Status: AC
  Administered 2015-11-10: 40 meq via ORAL
  Filled 2015-11-10: qty 2

## 2015-11-10 MED ORDER — OXYCODONE-ACETAMINOPHEN 5-325 MG PO TABS
1.0000 | ORAL_TABLET | ORAL | Status: DC | PRN
  Administered 2015-11-10 – 2015-11-14 (×11): 1 via ORAL
  Filled 2015-11-10 (×11): qty 1

## 2015-11-10 MED ORDER — ACETAMINOPHEN 325 MG PO TABS
650.0000 mg | ORAL_TABLET | Freq: Four times a day (QID) | ORAL | Status: DC | PRN
  Administered 2015-11-10 – 2015-11-12 (×3): 650 mg via ORAL
  Administered 2015-11-13: 325 mg via ORAL
  Administered 2015-11-13 – 2015-11-14 (×2): 650 mg via ORAL
  Filled 2015-11-10 (×9): qty 2

## 2015-11-10 MED ORDER — OXYCODONE-ACETAMINOPHEN 5-325 MG PO TABS
2.0000 | ORAL_TABLET | Freq: Once | ORAL | Status: AC
  Administered 2015-11-10: 2 via ORAL
  Filled 2015-11-10: qty 2

## 2015-11-10 MED ORDER — ACETAMINOPHEN 650 MG RE SUPP: 650.0000 mg | Freq: Four times a day (QID) | RECTAL | Status: DC | PRN

## 2015-11-10 MED ORDER — SODIUM CHLORIDE 0.9 % IV SOLN
Freq: Once | INTRAVENOUS | Status: AC
  Administered 2015-11-10: 04:00:00 via INTRAVENOUS

## 2015-11-10 MED ORDER — ONDANSETRON HCL 4 MG/2ML IJ SOLN: 4.0000 mg | Freq: Four times a day (QID) | INTRAMUSCULAR | Status: DC | PRN

## 2015-11-10 MED ORDER — HYDROMORPHONE HCL 1 MG/ML IJ SOLN
1.0000 mg | Freq: Once | INTRAMUSCULAR | Status: AC
  Administered 2015-11-10: 1 mg via INTRAVENOUS
  Filled 2015-11-10: qty 1

## 2015-11-10 MED ORDER — CLINDAMYCIN PHOSPHATE 600 MG/50ML IV SOLN
600.0000 mg | Freq: Once | INTRAVENOUS | Status: AC
  Administered 2015-11-10: 600 mg via INTRAVENOUS
  Filled 2015-11-10: qty 50

## 2015-11-10 MED ORDER — CLINDAMYCIN PHOSPHATE 600 MG/50ML IV SOLN
600.0000 mg | Freq: Three times a day (TID) | INTRAVENOUS | Status: DC
  Administered 2015-11-10 – 2015-11-15 (×16): 600 mg via INTRAVENOUS
  Filled 2015-11-10 (×16): qty 50

## 2015-11-10 MED ORDER — MORPHINE SULFATE (PF) 2 MG/ML IV SOLN: 2.0000 mg | INTRAVENOUS | Status: DC | PRN

## 2015-11-10 MED ORDER — ONDANSETRON HCL 4 MG PO TABS: 4.0000 mg | ORAL_TABLET | Freq: Four times a day (QID) | ORAL | Status: DC | PRN

## 2015-11-10 MED ORDER — IBUPROFEN 600 MG PO TABS
600.0000 mg | ORAL_TABLET | Freq: Four times a day (QID) | ORAL | Status: DC | PRN
  Administered 2015-11-10 – 2015-11-12 (×4): 600 mg via ORAL
  Filled 2015-11-10 (×4): qty 1

## 2015-11-10 NOTE — Care Management Note (Addendum)
Case Management Note  Patient Details  Name: Nicole Alexander MRN: [id number] Date of Birth: 1975/12/31  Subjective/Objective:                 Presents with fever and neck swelling, history significant of Lemierre syndrome. Lives with Princess Perna (Spouse)  [phone]. Independent with ADL's, no DME usage.PCP: Cammie Fulp.   Action/Plan: Return to home when medically stable. CM to f/u with disposition needs.  Expected Discharge Date:                  Expected Discharge Plan:  Home/Self Care  In-House Referral:     Discharge planning Services  CM Consult  Post Acute Care Choice:    Choice offered to:     DME Arranged:    DME Agency:     HH Arranged:    HH Agency:     Status of Service:  In process, will continue to follow  Medicare Important Message Given:    Date Medicare IM Given:    Medicare IM give by:    Date Additional Medicare IM Given:    Additional Medicare Important Message give by:     If discussed at [place] of Stay Meetings, dates discussed:    Additional Comments:  Sharin Mons, [place] [phone] 11/10/2015, 1:18 PM

## 2015-11-10 NOTE — Progress Notes (Signed)
PROGRESS NOTE        PATIENT DETAILS Name: Nicole Alexander Age: 40 y.o. Sex: female Date of Birth: 1976/07/14 Admit Date: 11/09/2015 Admitting Physician Norval Morton, MD SX:[id number], CAMMIE, MD Outpatient Specialists:None  Brief Narrative: Patient is a 40 y.o. female with no significant past medical history admitted on 4/24 with right sided neck pain and swelling, CT scan on admission shows suppurative adenitis.  Subjective: Thinks neck pain and swelling in the neck is slightly better than on admission  Assessment/Plan: Principal Problem: Acute cervical suppurative adenitis: Somewhat improved than on admission, nontoxic appearing. No leukocytosis. Fever curve is improving. No evidence of jugular vein thrombosis on CT scan. Continue clindamycin. Mono and streptococcal  screen negative, HIV pending. Follow clinical course. If no improvement, will consult ENT.  Active Problems: Anemia: Chronic issue, suspect could be related to menstrual loss. Check anemia panel.  Thyroid nodule: Incidental finding on CT scan, will need outpatient workup. TSH within normal limits  DVT Prophylaxis: Prophylactic Lovenox   Code Status: Full code  Family Communication: None at bedside  Disposition Plan: Remain inpatient  Antimicrobial agents: IV Clindamycin 4/25>>  Procedures: None  CONSULTS:  None  Time spent: 25 minutes-Greater than 50% of this time was spent in counseling, explanation of diagnosis, planning of further management, and coordination of care.  MEDICATIONS: Anti-infectives    Start     Dose/Rate Route Frequency Ordered Stop   11/10/15 0400  clindamycin (CLEOCIN) IVPB 600 mg     600 mg 100 mL/hr over 30 Minutes Intravenous Every 8 hours 11/10/15 0320     11/10/15 0200  clindamycin (CLEOCIN) IVPB 600 mg     600 mg 100 mL/hr over 30 Minutes Intravenous  Once 11/10/15 0147 11/10/15 0311      Scheduled Meds: . clindamycin (CLEOCIN) IV   600 mg Intravenous Q8H  . enoxaparin (LOVENOX) injection  40 mg Subcutaneous Q24H  . sodium chloride  1,000 mL Intravenous Once   Continuous Infusions:  PRN Meds:.acetaminophen **OR** acetaminophen, ondansetron **OR** ondansetron (ZOFRAN) IV, oxyCODONE-acetaminophen   PHYSICAL EXAM: Vital signs: Filed Vitals:   11/10/15 0300 11/10/15 0346 11/10/15 0747 11/10/15 1337  BP: 117/89 119/77 112/84   Pulse: 90 95 85   Temp:  98.5 F (36.9 C) 97.8 F (36.6 C) 98.8 F (37.1 C)  TempSrc:  Oral Oral Oral  Resp: 21 18 18    Height:      Weight:      SpO2: 100% 100% 95%    Filed Weights   11/09/15 1635  Weight: 112.038 kg (247 lb)   Body mass index is 36.46 kg/(m^2).   Gen Exam: Awake and alert with clear speech. Not in any distress.No stridor/no drooling noted. No trismus. Neck: Tender right neck area-with submandibular and right upper cervical adenopathy. Chest: B/L Clear.   CVS: S1 S2 Regular, no murmurs.  Abdomen: soft, BS +, non tender, non distended.  Extremities: no edema, lower extremities warm to touch. Neurologic: Non Focal.   Skin: No Rash or lesions   Wounds: N/A.   LABORATORY DATA: CBC:  Recent Labs Lab 11/09/15 2133 11/10/15 0351  WBC 5.2 4.4  NEUTROABS 2.9  --   HGB 10.9* 10.2*  HCT 33.2* 30.8*  MCV 82.2 82.6  PLT 222 [id number]    Basic Metabolic Panel:  Recent Labs Lab 11/09/15 2133 11/10/15 0351  NA 137  133*  K 3.6 3.3*  CL 103 103  CO2 21* 18*  GLUCOSE 92 97  BUN <5* <5*  CREATININE 0.77 0.74  CALCIUM 8.7* 7.9*    GFR: Estimated Creatinine Clearance: 125.9 mL/min (by C-G formula based on Cr of 0.74).  Liver Function Tests:  Recent Labs Lab 11/09/15 2133  AST 30  ALT 41  ALKPHOS 42  BILITOT 0.3  PROT 7.2  ALBUMIN 3.3*   No results for input(s): LIPASE, AMYLASE in the last 168 hours. No results for input(s): AMMONIA in the last 168 hours.  Coagulation Profile: No results for input(s): INR, PROTIME in the last 168 hours.  Cardiac  Enzymes: No results for input(s): CKTOTAL, CKMB, CKMBINDEX, TROPONINI in the last 168 hours.  BNP (last 3 results) No results for input(s): PROBNP in the last 8760 hours.  HbA1C: No results for input(s): HGBA1C in the last 72 hours.  CBG: No results for input(s): GLUCAP in the last 168 hours.  Lipid Profile: No results for input(s): CHOL, HDL, LDLCALC, TRIG, CHOLHDL, LDLDIRECT in the last 72 hours.  Thyroid Function Tests:  Recent Labs  11/10/15 0351  TSH 4.447    Anemia Panel:  Recent Labs  11/10/15 0351  TIBC 235*  IRON 17*    Urine analysis:    Component Value Date/Time   COLORURINE YELLOW 11/09/2015 2236   APPEARANCEUR CLEAR 11/09/2015 2236   LABSPEC 1.008 11/09/2015 2236   PHURINE 6.5 11/09/2015 2236   GLUCOSEU NEGATIVE 11/09/2015 2236   HGBUR NEGATIVE 11/09/2015 2236   BILIRUBINUR NEGATIVE 11/09/2015 2236   KETONESUR NEGATIVE 11/09/2015 2236   PROTEINUR NEGATIVE 11/09/2015 2236   UROBILINOGEN 4.0* 08/25/2013 1228   NITRITE NEGATIVE 11/09/2015 2236   LEUKOCYTESUR SMALL* 11/09/2015 2236    Sepsis Labs: Lactic Acid, Venous    Component Value Date/Time   LATICACIDVEN 1.38 11/09/2015 2135    MICROBIOLOGY: Recent Results (from the past 240 hour(s))  Rapid strep screen (not at [facility])     Status: None   Collection Time: 11/10/15 10:50 AM  Result Value Ref Range Status   Streptococcus, Group A Screen (Direct) NEGATIVE NEGATIVE Final    Comment: (NOTE) A Rapid Antigen test may result negative if the antigen level in the sample is below the detection level of this test. The FDA has not cleared this test as a stand-alone test therefore the rapid antigen negative result has reflexed to a Group A Strep culture.     RADIOLOGY STUDIES/RESULTS: Dg Chest 2 View  11/09/2015  CLINICAL DATA:  Fever for 6 days EXAM: CHEST  2 VIEW COMPARISON:  09/10/2013 FINDINGS: Normal heart size. Lungs clear. No pneumothorax. No pleural effusion. IMPRESSION: No active  cardiopulmonary disease. Electronically Signed   By: Marybelle Killings M.D.   On: 11/09/2015 18:54   Ct Angio Neck W/cm &/or Wo/cm  11/10/2015  CLINICAL DATA:  Initial valuation for fever with right-sided neck swelling. EXAM: CT ANGIOGRAPHY NECK TECHNIQUE: Multidetector CT imaging of the neck was performed using the standard protocol during bolus administration of intravenous contrast. Multiplanar CT image reconstructions and MIPs were obtained to evaluate the vascular anatomy. Carotid stenosis measurements (when applicable) are obtained utilizing NASCET criteria, using the distal internal carotid diameter as the denominator. CONTRAST:  66mL ISOVUE-300 IOPAMIDOL (ISOVUE-300) INJECTION 61% COMPARISON:  Prior study from 08/27/2013. FINDINGS: Aortic arch: Visualized aortic arch of normal caliber with normal branch pattern. No high-grade stenosis at the origin of the great vessels. Subclavian arteries patent bilaterally. Right carotid system: Right common carotid  artery patent from its origin to the bifurcation. Right ICA widely patent from the bifurcation to the skullbase. No stenosis, dissection, or vascular occlusion. Left carotid system: Left common carotid artery patent from its origin to the bifurcation. Left ICA widely patent from the bifurcation to the skullbase. No dissection, stenosis, or vascular occlusion. Vertebral arteries:Both vertebral arteries arise from the subclavian arteries. Vertebral arteries widely patent without stenosis, dissection, or occlusion. Skeleton: No acute osseous abnormality. No worrisome lytic or blastic osseous lesions. Other neck: Visualized lungs are clear. Visualized superior mediastinum demonstrates no acute abnormality. Approximately 15 mm hypodense nodule within the left lobe of thyroid, indeterminate. Enlarged right level 2a lymph node measures approximately 2 cm in short axis. Multiple additional right level 2B nodes present more posteriorly within the right neck measuring up  to 12 mm. Associated inflammatory stranding within this region. Few additional right level 2/3 nodes present more inferiorly measuring up to 8 mm. Few level 5 B nodes also present, measuring up to 1 cm. Right-sided supraclavicular nodes measure up to 8 mm. Findings may be reactive in nature. Suppurative adenitis could have this appearance given the inflammatory stranding. The internal jugular veins not well evaluated on this exam due to timing of the contrast bolus, but are grossly patent. No significant left-sided adenopathy. Probable retention cyst noted within the left sphenoid sinus. IMPRESSION: 1. Normal CTA of the neck. 2. Extensive bulky right-sided cervical adenopathy with associated inflammatory stranding, consistent with suppurative adenitis/reactive nodal disease. Clinical followup to resolution recommended. No jugular vein thrombosis identified on this exam, although evaluation somewhat limited by timing of the contrast bolus. 3. Approximate 15 mm heterogeneous left thyroid nodule, indeterminate. Further evaluation with dedicated thyroid ultrasound recommended. This could be performed on a nonemergent basis. Electronically Signed   By: Jeannine Boga M.D.   On: 11/10/2015 01:35       Oren Binet, MD  [facility] Pager:[phone]  If 7PM-7AM, please contact night-coverage [email or web address] Password [facility] 11/10/2015, 2:47 PM

## 2015-11-10 NOTE — Progress Notes (Signed)
Patient verbalized that she feels like she has fever, but her temp. was 99.0 orally. RN suggested to check temp rectally but patient refused. She said she will be fine. Will continue to monitor.

## 2015-11-10 NOTE — Progress Notes (Addendum)
Pt's temp's 101.1.She has Tylenol q6.She had a dose of tylenol at 7p,and percocet at 9:40p.Next tylenol's dose is 1am. MD notified. Awaiting orders. Will continue to monitor. Motrin ordered and administered.

## 2015-11-10 NOTE — H&P (Addendum)
History and Physical    Nicole Alexander [id number] DOB: 1976-05-09 DOA: 11/09/2015  Referring MD/NP/PA: Dr. Oleta Mouse PCP: Antony Blackbird, MD  Outpatient Specialists: n/a Patient coming from: Fever  Chief Complaint: Fever and neck swelling  HPI: Nicole Alexander is a 40 y.o. female with medical history significant of Lemierre syndrome; who presents with complaints of fever and right-sided neck swelling. Symptoms started 1 week ago with fever and right-sided neck swelling. She reports fever got as high as 101.43F at home. She reports a sharp pain on the right side of her neck that radiated to her ear. She was seen at urgent care 6 days ago. At that time she was checked for strep throat told this was negative and was sent home with a Z-Pak. She notes having a waxing and waning fever taking Aleve to help treat symptoms. Associated symptoms included a nonproductive cough. She completed the antibiotics 2 days ago, but noted persistent fever and continued pain and swelling of the right side of her neck. She went back to her primary care provider's office yesterday and was directed to the emergency department for further evaluation. Patient notes that back in 2015 she was diagnosed with Lemierre syndrome. Patient denies having any headache, chest pain, rash, shortness of breath, wheezing, or abnormal bleeding.    ED Course: Upon admission patient was seen have temperature of 100.2, heart rate of up to 103, respiratory rate up to 33, blood pressure stable. Chest x-ray showed no acute infiltrate, and urinalysis . To be likely contaminated. CT angiogram of the neck revealed extensive bulky right sided adenopathy suggestive of superlative adenitis/reactive nodal disease. Also noted to have a 15 mm left thyroid nodule. Patient was started on clindamycin, given morphine/hydromorphone for pain, and [facility] consulted to admit.  Review of Systems: As per HPI otherwise 10 point review of systems negative.     History reviewed. No pertinent past medical history.  Past Surgical History  Procedure Laterality Date  . Breast surgery      breast reduction  . Cholecystectomy N/A 11/28/2012    Procedure: LAPAROSCOPIC CHOLECYSTECTOMY WITH INTRAOPERATIVE CHOLANGIOGRAM;  Surgeon: Zenovia Jarred, MD;  Location: [place];  Service: General;  Laterality: N/A;     reports that she has quit smoking. She has never used smokeless tobacco. She reports that she drinks alcohol. She reports that she does not use illicit drugs.  Allergies  Allergen Reactions  . Penicillins Swelling    "swole up like a balloon" Has patient had a PCN reaction causing immediate rash, facial/tongue/throat swelling, SOB or lightheadedness with hypotension: YES Has patient had a PCN reaction causing severe rash involving mucus membranes or skin necrosis: NO Has patient had a PCN reaction that required hospitalization NO Has patient had a PCN reaction occurring within the last 10 years: NO If all of the above answers are "NO", then may proceed with Cephalosporin use.    Family History  Problem Relation Age of Onset  . Heart disease Mother     PTCA/stent  . Hypertension Mother   . Diabetes Mother   . Hyperlipidemia Mother   . Cancer Father     lung  . Hypertension Father   . Diabetes Father   . Hyperlipidemia Father   . Hypertension Brother   . Cancer Maternal Grandfather     bone    Prior to Admission medications   Not on File    Physical Exam: Filed Vitals:   11/10/15 0100 11/10/15 0200 11/10/15 0230 11/10/15 0300  BP: 119/86 109/82 121/88 117/89  Pulse: 89 94 92 90  Temp:   99.4 F (37.4 C)   TempSrc:      Resp: 20 24 25 21   Height:      Weight:      SpO2: 96% 100% 100% 100%      Constitutional: Patient appears sick, but not toxic. Alert and oriented 3 able to follow commands. Filed Vitals:   11/10/15 0100 11/10/15 0200 11/10/15 0230 11/10/15 0300  BP: 119/86 109/82 121/88 117/89  Pulse: 89 94 92  90  Temp:   99.4 F (37.4 C)   TempSrc:      Resp: 20 24 25 21   Height:      Weight:      SpO2: 96% 100% 100% 100%   Eyes: PERRL, lids and conjunctivae normal ENMT: Mucous membranes are moist. Bilateral tonsillar enlargement with exudate  present.  Neck: Significant right sided adenopathy of a submandibular node with tenderness to the touch. Range of motion appears to be slightly decreased when turning neck to the right side secondary to pain. No stridor noted. Speech is otherwise clear. Respiratory: clear to auscultation bilaterally, no wheezing, no crackles. Mildly tachypneic with no accessory muscle use noted. Cardiovascular: Regular rate and rhythm, no murmurs / rubs / gallops. No extremity edema. 2+ pedal pulses. No carotid bruits.  Abdomen: no tenderness, no masses palpated. No hepatosplenomegaly. Bowel sounds positive.  Musculoskeletal: no clubbing / cyanosis. No joint deformity upper and lower extremities. Good ROM, no contractures. Normal muscle tone.  Skin: no rashes, lesions, ulcers. No induration Neurologic: CN 2-12 grossly intact. Sensation intact, DTR normal. Strength 5/5 in all 4.  Psychiatric: Normal judgment and insight. Alert and oriented x 3. Normal mood.     Labs on Admission: I have personally reviewed following labs and imaging studies  CBC:  Recent Labs Lab 11/09/15 2133  WBC 5.2  NEUTROABS 2.9  HGB 10.9*  HCT 33.2*  MCV 82.2  PLT [id number]   Basic Metabolic Panel:  Recent Labs Lab 11/09/15 2133  NA 137  K 3.6  CL 103  CO2 21*  GLUCOSE 92  BUN <5*  CREATININE 0.77  CALCIUM 8.7*   GFR: Estimated Creatinine Clearance: 125.9 mL/min (by C-G formula based on Cr of 0.77). Liver Function Tests:  Recent Labs Lab 11/09/15 2133  AST 30  ALT 41  ALKPHOS 42  BILITOT 0.3  PROT 7.2  ALBUMIN 3.3*   No results for input(s): LIPASE, AMYLASE in the last 168 hours. No results for input(s): AMMONIA in the last 168 hours. Coagulation Profile: No results  for input(s): INR, PROTIME in the last 168 hours. Cardiac Enzymes: No results for input(s): CKTOTAL, CKMB, CKMBINDEX, TROPONINI in the last 168 hours. BNP (last 3 results) No results for input(s): PROBNP in the last 8760 hours. HbA1C: No results for input(s): HGBA1C in the last 72 hours. CBG: No results for input(s): GLUCAP in the last 168 hours. Lipid Profile: No results for input(s): CHOL, HDL, LDLCALC, TRIG, CHOLHDL, LDLDIRECT in the last 72 hours. Thyroid Function Tests: No results for input(s): TSH, T4TOTAL, FREET4, T3FREE, THYROIDAB in the last 72 hours. Anemia Panel: No results for input(s): VITAMINB12, FOLATE, FERRITIN, TIBC, IRON, RETICCTPCT in the last 72 hours. Urine analysis:    Component Value Date/Time   COLORURINE YELLOW 11/09/2015 2236   APPEARANCEUR CLEAR 11/09/2015 2236   LABSPEC 1.008 11/09/2015 2236   PHURINE 6.5 11/09/2015 2236   GLUCOSEU NEGATIVE 11/09/2015 2236   HGBUR NEGATIVE 11/09/2015  [place] 11/09/2015 2236   KETONESUR NEGATIVE 11/09/2015 2236   PROTEINUR NEGATIVE 11/09/2015 2236   UROBILINOGEN 4.0* 08/25/2013 1228   NITRITE NEGATIVE 11/09/2015 2236   LEUKOCYTESUR SMALL* 11/09/2015 2236   Sepsis Labs: @LABRCNTIP (procalcitonin:4,lacticidven:4) )No results found for this or any previous visit (from the past 240 hour(s)).   Radiological Exams on Admission: Dg Chest 2 View  11/09/2015  CLINICAL DATA:  Fever for 6 days EXAM: CHEST  2 VIEW COMPARISON:  09/10/2013 FINDINGS: Normal heart size. Lungs clear. No pneumothorax. No pleural effusion. IMPRESSION: No active cardiopulmonary disease. Electronically Signed   By: Marybelle Killings M.D.   On: 11/09/2015 18:54   Ct Angio Neck W/cm &/or Wo/cm  11/10/2015  CLINICAL DATA:  Initial valuation for fever with right-sided neck swelling. EXAM: CT ANGIOGRAPHY NECK TECHNIQUE: Multidetector CT imaging of the neck was performed using the standard protocol during bolus administration of intravenous  contrast. Multiplanar CT image reconstructions and MIPs were obtained to evaluate the vascular anatomy. Carotid stenosis measurements (when applicable) are obtained utilizing NASCET criteria, using the distal internal carotid diameter as the denominator. CONTRAST:  63mL ISOVUE-300 IOPAMIDOL (ISOVUE-300) INJECTION 61% COMPARISON:  Prior study from 08/27/2013. FINDINGS: Aortic arch: Visualized aortic arch of normal caliber with normal branch pattern. No high-grade stenosis at the origin of the great vessels. Subclavian arteries patent bilaterally. Right carotid system: Right common carotid artery patent from its origin to the bifurcation. Right ICA widely patent from the bifurcation to the skullbase. No stenosis, dissection, or vascular occlusion. Left carotid system: Left common carotid artery patent from its origin to the bifurcation. Left ICA widely patent from the bifurcation to the skullbase. No dissection, stenosis, or vascular occlusion. Vertebral arteries:Both vertebral arteries arise from the subclavian arteries. Vertebral arteries widely patent without stenosis, dissection, or occlusion. Skeleton: No acute osseous abnormality. No worrisome lytic or blastic osseous lesions. Other neck: Visualized lungs are clear. Visualized superior mediastinum demonstrates no acute abnormality. Approximately 15 mm hypodense nodule within the left lobe of thyroid, indeterminate. Enlarged right level 2a lymph node measures approximately 2 cm in short axis. Multiple additional right level 2B nodes present more posteriorly within the right neck measuring up to 12 mm. Associated inflammatory stranding within this region. Few additional right level 2/3 nodes present more inferiorly measuring up to 8 mm. Few level 5 B nodes also present, measuring up to 1 cm. Right-sided supraclavicular nodes measure up to 8 mm. Findings may be reactive in nature. Suppurative adenitis could have this appearance given the inflammatory stranding. The  internal jugular veins not well evaluated on this exam due to timing of the contrast bolus, but are grossly patent. No significant left-sided adenopathy. Probable retention cyst noted within the left sphenoid sinus. IMPRESSION: 1. Normal CTA of the neck. 2. Extensive bulky right-sided cervical adenopathy with associated inflammatory stranding, consistent with suppurative adenitis/reactive nodal disease. Clinical followup to resolution recommended. No jugular vein thrombosis identified on this exam, although evaluation somewhat limited by timing of the contrast bolus. 3. Approximate 15 mm heterogeneous left thyroid nodule, indeterminate. Further evaluation with dedicated thyroid ultrasound recommended. This could be performed on a nonemergent basis. Electronically Signed   By: Jeannine Boga M.D.   On: 11/10/2015 01:35    Assessment/Plan Sepsis acute cervical adenitis: Patient reports fevers and right neck swelling. On admission patient found to have low-grade fever, tachycardic, and tachypneic. Physical exam showing bilateral tonsillar enlargement with exudate present. CT imaging showing suppurative adenitis possibly.  - Admit to MedSurg  bed - Check rapid strep - Check blood cultures given patient's previous history of bacteremia   - Clindamycin IV  - Trend  lactic acid levels  - Hydrocodone prn neck pain - Tylenol prn fever - IV fluids normal saline and 100 m/hr  Thyroid nodule: Incidental finding, but noted to be 15 mm size - Check TSH. - Consider follow-up ultrasound  Anemia: Hemoglobin noted to be 10.9 on admission. No active source of bleeding noted. Patient with persistent lower limit of normal MCV/ MCH. - Check iron and TIBC - f/u repeat CBC in am  DVT prophylaxis:Lovenox Code Status: Full Family Communication: none Disposition Plan:  Consults called: None Admission status: obs med surg   Norval Morton MD [facility] Pager [phone]  If 7PM-7AM, please  contact night-coverage [email or web address] Password [email or web address]  11/10/2015, 3:15 AM

## 2015-11-11 DIAGNOSIS — E041 Nontoxic single thyroid nodule: Secondary | ICD-10-CM | POA: Diagnosis not present

## 2015-11-11 DIAGNOSIS — L040 Acute lymphadenitis of face, head and neck: Secondary | ICD-10-CM | POA: Diagnosis not present

## 2015-11-11 DIAGNOSIS — A419 Sepsis, unspecified organism: Secondary | ICD-10-CM | POA: Diagnosis not present

## 2015-11-11 DIAGNOSIS — D649 Anemia, unspecified: Secondary | ICD-10-CM | POA: Diagnosis not present

## 2015-11-11 LAB — CBC
HEMATOCRIT: 34 % — AB (ref 36.0–46.0)
Hemoglobin: 11.1 g/dL — ABNORMAL LOW (ref 12.0–15.0)
MCH: 26.9 pg (ref 26.0–34.0)
MCHC: 32.6 g/dL (ref 30.0–36.0)
MCV: 82.5 fL (ref 78.0–100.0)
Platelets: 251 10*3/uL (ref 150–400)
RBC: 4.12 MIL/uL (ref 3.87–5.11)
RDW: 14.1 % (ref 11.5–15.5)
WBC: 3.5 10*3/uL — AB (ref 4.0–10.5)

## 2015-11-11 LAB — HIV ANTIBODY (ROUTINE TESTING W REFLEX): HIV SCREEN 4TH GENERATION: NONREACTIVE

## 2015-11-11 NOTE — Progress Notes (Signed)
PROGRESS NOTE        PATIENT DETAILS Name: Nicole Alexander Age: 40 y.o. Sex: female Date of Birth: 1976-03-30 Admit Date: 11/09/2015 Admitting Physician Norval Morton, MD [id number], CAMMIE, MD Outpatient Specialists:None  Brief Narrative: Patient is a 41 y.o. 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  Subjective: Had fever last night-continues to have pain and swelling in the right side of her neck better than on admission  Assessment/Plan: Principal Problem: Acute cervical suppurative adenitis: Minimally improved than on admission, continues to appear nontoxic appearing. But had fever last night. Mono/streptococcal screen negative, HIV negative. Not sure what's causing her to have recurrent cervical lymphadenitis-have sent out toxoplasmosis, CMV, EBV titers along with HIV viral load. We will also check Ace level, a NA and RA factor. Have consulted ID. I suspect at some point she is going to require a biopsy.  Active Problems: Hypokalemia: Replete and recheck   Anemia: Chronic issue, suspect could be related to menstrual loss. Follow  Thyroid nodule: Incidental finding on CT scan, will need outpatient workup. TSH within normal limits  DVT Prophylaxis: Prophylactic Lovenox   Code Status: Full code  Family Communication: None at bedside  Disposition Plan: Remain inpatient  Antimicrobial agents: IV Clindamycin 4/25>>  Procedures: None  CONSULTS:  None  Time spent: 25 minutes-Greater than 50% of this time was spent in counseling, explanation of diagnosis, planning of further management, and coordination of care.  MEDICATIONS: Anti-infectives    Start     Dose/Rate Route Frequency Ordered Stop   11/10/15 0400  clindamycin  (CLEOCIN) IVPB 600 mg     600 mg 100 mL/hr over 30 Minutes Intravenous Every 8 hours 11/10/15 0320     11/10/15 0200  clindamycin (CLEOCIN) IVPB 600 mg     600 mg 100 mL/hr over 30 Minutes Intravenous  Once 11/10/15 0147 11/10/15 0311      Scheduled Meds: . clindamycin (CLEOCIN) IV  600 mg Intravenous Q8H  . enoxaparin (LOVENOX) injection  40 mg Subcutaneous Q24H  . sodium chloride  1,000 mL Intravenous Once   Continuous Infusions:  PRN Meds:.acetaminophen **OR** acetaminophen, ibuprofen, ondansetron **OR** ondansetron (ZOFRAN) IV, oxyCODONE-acetaminophen   PHYSICAL EXAM: Vital signs: Filed Vitals:   11/10/15 2331 11/11/15 0100 11/11/15 0319 11/11/15 0510  BP:    119/77  Pulse:    88  Temp: 101.1 F (38.4 C) 100.1 F (37.8 C) 99 F (37.2 C) 99 F (37.2 C)  TempSrc: Oral Oral Oral Oral  Resp:    20  Height:      Weight:      SpO2:    100%   Filed Weights   11/09/15 1635  Weight: 112.038 kg (247 lb)   Body mass index is 36.46 kg/(m^2).   Gen Exam: Awake and alert with clear speech. Not in any distress.No stridor/no drooling noted. No trismus. Neck: Tender right neck area-with submandibular and right upper cervical adenopathy. Chest: B/L Clear.   CVS: S1 S2 Regular, no murmurs.  Abdomen: soft, BS +, non tender, non distended.  Extremities: no edema, lower extremities warm to touch. Neurologic: Non Focal.   Skin: No Rash or lesions   Wounds: N/A.   LABORATORY DATA:  CBC:  Recent Labs Lab 11/09/15 2133 11/10/15 0351 11/11/15 0737  WBC 5.2 4.4 3.5*  NEUTROABS 2.9  --   --   HGB 10.9* 10.2* 11.1*  HCT 33.2* 30.8* 34.0*  MCV 82.2 82.6 82.5  PLT 222 177 [id number]    Basic Metabolic Panel:  Recent Labs Lab 11/09/15 2133 11/10/15 0351  NA 137 133*  K 3.6 3.3*  CL 103 103  CO2 21* 18*  GLUCOSE 92 97  BUN <5* <5*  CREATININE 0.77 0.74  CALCIUM 8.7* 7.9*    GFR: Estimated Creatinine Clearance: 125.9 mL/min (by C-G formula based on Cr of 0.74).  Liver  Function Tests:  Recent Labs Lab 11/09/15 2133  AST 30  ALT 41  ALKPHOS 42  BILITOT 0.3  PROT 7.2  ALBUMIN 3.3*   No results for input(s): LIPASE, AMYLASE in the last 168 hours. No results for input(s): AMMONIA in the last 168 hours.  Coagulation Profile: No results for input(s): INR, PROTIME in the last 168 hours.  Cardiac Enzymes: No results for input(s): CKTOTAL, CKMB, CKMBINDEX, TROPONINI in the last 168 hours.  BNP (last 3 results) No results for input(s): PROBNP in the last 8760 hours.  HbA1C: No results for input(s): HGBA1C in the last 72 hours.  CBG: No results for input(s): GLUCAP in the last 168 hours.  Lipid Profile: No results for input(s): CHOL, HDL, LDLCALC, TRIG, CHOLHDL, LDLDIRECT in the last 72 hours.  Thyroid Function Tests:  Recent Labs  11/10/15 0351  TSH 4.447    Anemia Panel:  Recent Labs  11/10/15 0351  TIBC 235*  IRON 17*    Urine analysis:    Component Value Date/Time   COLORURINE YELLOW 11/09/2015 2236   APPEARANCEUR CLEAR 11/09/2015 2236   LABSPEC 1.008 11/09/2015 2236   PHURINE 6.5 11/09/2015 2236   GLUCOSEU NEGATIVE 11/09/2015 2236   HGBUR NEGATIVE 11/09/2015 2236   BILIRUBINUR NEGATIVE 11/09/2015 2236   KETONESUR NEGATIVE 11/09/2015 2236   PROTEINUR NEGATIVE 11/09/2015 2236   UROBILINOGEN 4.0* 08/25/2013 1228   NITRITE NEGATIVE 11/09/2015 2236   LEUKOCYTESUR SMALL* 11/09/2015 2236    Sepsis Labs: Lactic Acid, Venous    Component Value Date/Time   LATICACIDVEN 1.38 11/09/2015 2135    MICROBIOLOGY: Recent Results (from the past 240 hour(s))  Rapid strep screen (not at [facility])     Status: None   Collection Time: 11/10/15 10:50 AM  Result Value Ref Range Status   Streptococcus, Group A Screen (Direct) NEGATIVE NEGATIVE Final    Comment: (NOTE) A Rapid Antigen test may result negative if the antigen level in the sample is below the detection level of this test. The FDA has not cleared this test as a stand-alone  test therefore the rapid antigen negative result has reflexed to a Group A Strep culture.     RADIOLOGY STUDIES/RESULTS: Dg Chest 2 View  11/09/2015  CLINICAL DATA:  Fever for 6 days EXAM: CHEST  2 VIEW COMPARISON:  09/10/2013 FINDINGS: Normal heart size. Lungs clear. No pneumothorax. No pleural effusion. IMPRESSION: No active cardiopulmonary disease. Electronically Signed   By: Marybelle Killings M.D.   On: 11/09/2015 18:54   Ct Angio Neck W/cm &/or Wo/cm  11/10/2015  CLINICAL DATA:  Initial valuation for fever with right-sided neck swelling. EXAM: CT ANGIOGRAPHY NECK TECHNIQUE: Multidetector CT imaging of the neck was performed using the standard protocol during bolus administration of intravenous contrast. Multiplanar CT image reconstructions and MIPs were obtained to evaluate the vascular anatomy. Carotid stenosis measurements (when  applicable) are obtained utilizing NASCET criteria, using the distal internal carotid diameter as the denominator. CONTRAST:  42mL ISOVUE-300 IOPAMIDOL (ISOVUE-300) INJECTION 61% COMPARISON:  Prior study from 08/27/2013. FINDINGS: Aortic arch: Visualized aortic arch of normal caliber with normal branch pattern. No high-grade stenosis at the origin of the great vessels. Subclavian arteries patent bilaterally. Right carotid system: Right common carotid artery patent from its origin to the bifurcation. Right ICA widely patent from the bifurcation to the skullbase. No stenosis, dissection, or vascular occlusion. Left carotid system: Left common carotid artery patent from its origin to the bifurcation. Left ICA widely patent from the bifurcation to the skullbase. No dissection, stenosis, or vascular occlusion. Vertebral arteries:Both vertebral arteries arise from the subclavian arteries. Vertebral arteries widely patent without stenosis, dissection, or occlusion. Skeleton: No acute osseous abnormality. No worrisome lytic or blastic osseous lesions. Other neck: Visualized lungs are  clear. Visualized superior mediastinum demonstrates no acute abnormality. Approximately 15 mm hypodense nodule within the left lobe of thyroid, indeterminate. Enlarged right level 2a lymph node measures approximately 2 cm in short axis. Multiple additional right level 2B nodes present more posteriorly within the right neck measuring up to 12 mm. Associated inflammatory stranding within this region. Few additional right level 2/3 nodes present more inferiorly measuring up to 8 mm. Few level 5 B nodes also present, measuring up to 1 cm. Right-sided supraclavicular nodes measure up to 8 mm. Findings may be reactive in nature. Suppurative adenitis could have this appearance given the inflammatory stranding. The internal jugular veins not well evaluated on this exam due to timing of the contrast bolus, but are grossly patent. No significant left-sided adenopathy. Probable retention cyst noted within the left sphenoid sinus. IMPRESSION: 1. Normal CTA of the neck. 2. Extensive bulky right-sided cervical adenopathy with associated inflammatory stranding, consistent with suppurative adenitis/reactive nodal disease. Clinical followup to resolution recommended. No jugular vein thrombosis identified on this exam, although evaluation somewhat limited by timing of the contrast bolus. 3. Approximate 15 mm heterogeneous left thyroid nodule, indeterminate. Further evaluation with dedicated thyroid ultrasound recommended. This could be performed on a nonemergent basis. Electronically Signed   By: Jeannine Boga M.D.   On: 11/10/2015 01:35       Oren Binet, MD  [facility] Pager:[phone]  If 7PM-7AM, please contact night-coverage [email or web address] Password [facility] 11/11/2015, 11:44 AM

## 2015-11-11 NOTE — Consult Note (Addendum)
[facility]  Date of Admission:  11/09/2015  Date of Consult:  11/11/2015  Reason for Consult: Recurrent Lymphadenits Referring Physician: Nigel Bridgeman  Impression/Recommendation Recurrent cervical LAN Sepsis  Would  Get LN Bx Consider Onc eval.  Send fungal serologies, toxo serologies, ebv, cmv, quantiferon gold, bartonella.  Check ANA, ANCA Send LN tissue to [facility] for bacterial/fungal/afb primers Continue clinda for now  Comment- Very odd case which would seem to need tissue for diagnosis. It is possible that she again has cervical infection but I would suggest that this is due to her LN dysfunction primarily.   Thank you so much for this interesting consult,   Bobby Rumpf (pager) [phone] [email or web address]  Nicole Alexander is an 40 y.o. female.  HPI: 40 yo F with hx of recurrent cervical lymphadenitis and fever.  She was seen by ID (me) in 2014 and w/u was only notable for elevated EBV IgG. She was also noted to have an [place] of 900. Her fevers resolved and she was lost to f/u.  She returned to ID 08-2013 after having had pharyngitis, microaerophilic strep bacteremia, pulmonary emboli, and lemiere's syndrome. She was treated with 6 weeks of atbx.  In 2016 she was seen by Onc for leukocytosis and lymphadenopathy. She underwent flow cytometry (normal) and blood smear (9WBC 16 57.5% N) . Her leukocytosis and lymphadenopathy were attributed to infections at that time.   She returns 4-25 with 1 week of neck pain and R sided swelling, fever 101.9. She had been seen in urgent care and given a z-pack without improvement.  In ED on 4-24 she had CT scan showing Extensive bulky right-sided cervical adenopathy with associated inflammatory stranding, consistent with suppurative adenitis/reactive nodal disease. Clinical followup to resolution recommended. No jugular vein thrombosis identified on this exam, although evaluation somewhat limited  by timing of the contrast Bolus. Approximate 15 mm heterogeneous left thyroid nodule, indeterminate. Further evaluation with dedicated thyroid ultrasound recommended.   She is HIV-. Her WBC is normal. She has continued to have temp 102.5 She was started on clinda.   History reviewed. No pertinent past medical history.  Past Surgical History  Procedure Laterality Date  . Breast surgery      breast reduction  . Cholecystectomy N/A 11/28/2012    Procedure: LAPAROSCOPIC CHOLECYSTECTOMY WITH INTRAOPERATIVE CHOLANGIOGRAM;  Surgeon: Zenovia Jarred, MD;  Location: [facility];  Service: General;  Laterality: N/A;     Allergies  Allergen Reactions  . Penicillins Swelling    "swole up like a balloon" Has patient had a PCN reaction causing immediate rash, facial/tongue/throat swelling, SOB or lightheadedness with hypotension: YES Has patient had a PCN reaction causing severe rash involving mucus membranes or skin necrosis: NO Has patient had a PCN reaction that required hospitalization NO Has patient had a PCN reaction occurring within the last 10 years: NO If all of the above answers are "NO", then may proceed with Cephalosporin use.    Medications:  Scheduled: . clindamycin (CLEOCIN) IV  600 mg Intravenous Q8H  . enoxaparin (LOVENOX) injection  40 mg Subcutaneous Q24H  . sodium chloride  1,000 mL Intravenous Once    Abtx:  Anti-infectives    Start     Dose/Rate Route Frequency Ordered Stop   11/10/15 0400  clindamycin (CLEOCIN) IVPB 600 mg     600 mg 100 mL/hr over 30 Minutes Intravenous Every 8 hours 11/10/15 0320     11/10/15 0200  clindamycin (CLEOCIN) IVPB 600 mg  600 mg 100 mL/hr over 30 Minutes Intravenous  Once 11/10/15 0147 11/10/15 0311      Total days of antibiotics: 1 clinda          Social History:  reports that she has quit smoking. She has never used smokeless tobacco. She reports that she drinks alcohol. She reports that she does not use illicit drugs.  Family  History  Problem Relation Age of Onset  . Heart disease Mother     PTCA/stent  . Hypertension Mother   . Diabetes Mother   . Hyperlipidemia Mother   . Cancer Father     lung  . Hypertension Father   . Diabetes Father   . Hyperlipidemia Father   . Hypertension Brother   . Cancer Maternal Grandfather     bone    General ROS: no dysphagia, no change in wt, no sore throat, no sob or cough, normal urination, no muscle or joint aches, no hospitalizations in 2016  Blood pressure 119/77, pulse 88, temperature 99 F (37.2 C), temperature source Oral, resp. rate 20, height '5\' 9"'  (1.753 m), weight 112.038 kg (247 lb), last menstrual period 10/23/2015, SpO2 100 %. General appearance: alert, cooperative and no distress Eyes: negative findings: conjunctivae and sclerae normal and pupils equal, round, reactive to light and accomodation Throat: lips, mucosa, and tongue normal; teeth and gums normal Neck: supple, symmetrical, trachea midline Lungs: clear to auscultation bilaterally Heart: regular rate and rhythm Abdomen: normal findings: bowel sounds normal and soft, non-tender Extremities: edema none Lymph nodes: Cervical adenopathy: large matt of tender R cevical LN.   Nodule in L axilla, per pt present for many years.    Results for orders placed or performed during the hospital encounter of 11/09/15 (from the past 48 hour(s))  Comprehensive metabolic panel     Status: Abnormal   Collection Time: 11/09/15  9:33 PM  Result Value Ref Range   Sodium 137 135 - 145 mmol/L   Potassium 3.6 3.5 - 5.1 mmol/L   Chloride 103 101 - 111 mmol/L   CO2 21 (L) 22 - 32 mmol/L   Glucose, Bld 92 65 - 99 mg/dL   BUN <5 (L) 6 - 20 mg/dL   Creatinine, Ser 0.77 0.44 - 1.00 mg/dL   Calcium 8.7 (L) 8.9 - 10.3 mg/dL   Total Protein 7.2 6.5 - 8.1 g/dL   Albumin 3.3 (L) 3.5 - 5.0 g/dL   AST 30 15 - 41 U/L   ALT 41 14 - 54 U/L   Alkaline Phosphatase 42 38 - 126 U/L   Total Bilirubin 0.3 0.3 - 1.2 mg/dL   GFR  calc non Af Amer >60 >60 mL/min   GFR calc Af Amer >60 >60 mL/min    Comment: (NOTE) The eGFR has been calculated using the CKD EPI equation. This calculation has not been validated in all clinical situations. eGFR's persistently <60 mL/min signify possible Chronic Kidney Disease.    Anion gap 13 5 - 15  CBC WITH DIFFERENTIAL     Status: Abnormal   Collection Time: 11/09/15  9:33 PM  Result Value Ref Range   WBC 5.2 4.0 - 10.5 K/uL   RBC 4.04 3.87 - 5.11 MIL/uL   Hemoglobin 10.9 (L) 12.0 - 15.0 g/dL   HCT 33.2 (L) 36.0 - 46.0 %   MCV 82.2 78.0 - 100.0 fL   MCH 27.0 26.0 - 34.0 pg   MCHC 32.8 30.0 - 36.0 g/dL   RDW 13.8 11.5 - 15.5 %  Platelets 222 150 - 400 K/uL   Neutrophils Relative % 55 %   Neutro Abs 2.9 1.7 - 7.7 K/uL   Lymphocytes Relative 33 %   Lymphs Abs 1.7 0.7 - 4.0 K/uL   Monocytes Relative 11 %   Monocytes Absolute 0.6 0.1 - 1.0 K/uL   Eosinophils Relative 1 %   Eosinophils Absolute 0.0 0.0 - 0.7 K/uL   Basophils Relative 0 %   Basophils Absolute 0.0 0.0 - 0.1 K/uL  I-Stat CG4 Lactic Acid, ED  (not at  [facility])     Status: None   Collection Time: 11/09/15  9:35 PM  Result Value Ref Range   Lactic Acid, Venous 1.38 0.5 - 2.0 mmol/L  Urinalysis, Routine w reflex microscopic (not at [facility])     Status: Abnormal   Collection Time: 11/09/15 10:36 PM  Result Value Ref Range   Color, Urine YELLOW YELLOW   APPearance CLEAR CLEAR   Specific Gravity, Urine 1.008 1.005 - 1.030   pH 6.5 5.0 - 8.0   Glucose, UA NEGATIVE NEGATIVE mg/dL   Hgb urine dipstick NEGATIVE NEGATIVE   Bilirubin Urine NEGATIVE NEGATIVE   Ketones, ur NEGATIVE NEGATIVE mg/dL   Protein, ur NEGATIVE NEGATIVE mg/dL   Nitrite NEGATIVE NEGATIVE   Leukocytes, UA SMALL (A) NEGATIVE  Urine microscopic-add on     Status: Abnormal   Collection Time: 11/09/15 10:36 PM  Result Value Ref Range   Squamous Epithelial / LPF 6-30 (A) NONE SEEN   WBC, UA 0-5 0 - 5 WBC/hpf   RBC / HPF 0-5 0 - 5 RBC/hpf   Bacteria,  UA FEW (A) NONE SEEN  CBC     Status: Abnormal   Collection Time: 11/10/15  3:51 AM  Result Value Ref Range   WBC 4.4 4.0 - 10.5 K/uL   RBC 3.73 (L) 3.87 - 5.11 MIL/uL   Hemoglobin 10.2 (L) 12.0 - 15.0 g/dL   HCT 30.8 (L) 36.0 - 46.0 %   MCV 82.6 78.0 - 100.0 fL   MCH 27.3 26.0 - 34.0 pg   MCHC 33.1 30.0 - 36.0 g/dL   RDW 14.0 11.5 - 15.5 %   Platelets 177 150 - 400 K/uL  Basic metabolic panel     Status: Abnormal   Collection Time: 11/10/15  3:51 AM  Result Value Ref Range   Sodium 133 (L) 135 - 145 mmol/L   Potassium 3.3 (L) 3.5 - 5.1 mmol/L   Chloride 103 101 - 111 mmol/L   CO2 18 (L) 22 - 32 mmol/L   Glucose, Bld 97 65 - 99 mg/dL   BUN <5 (L) 6 - 20 mg/dL   Creatinine, Ser 0.74 0.44 - 1.00 mg/dL   Calcium 7.9 (L) 8.9 - 10.3 mg/dL   GFR calc non Af Amer >60 >60 mL/min   GFR calc Af Amer >60 >60 mL/min    Comment: (NOTE) The eGFR has been calculated using the CKD EPI equation. This calculation has not been validated in all clinical situations. eGFR's persistently <60 mL/min signify possible Chronic Kidney Disease.    Anion gap 12 5 - 15  TSH     Status: None   Collection Time: 11/10/15  3:51 AM  Result Value Ref Range   TSH 4.447 0.350 - 4.500 uIU/mL  Iron and TIBC     Status: Abnormal   Collection Time: 11/10/15  3:51 AM  Result Value Ref Range   Iron 17 (L) 28 - 170 ug/dL   TIBC 235 (L)  250 - 450 ug/dL   Saturation Ratios 7 (L) 10.4 - 31.8 %   UIBC 218 ug/dL  HIV antibody     Status: None   Collection Time: 11/10/15 10:35 AM  Result Value Ref Range   HIV Screen 4th Generation wRfx Non Reactive Non Reactive    Comment: (NOTE) Performed At: [facility] [place], [place] [id number] Lindon Romp MD [id number]   Mononucleosis screen     Status: None   Collection Time: 11/10/15 10:35 AM  Result Value Ref Range   Mono Screen NEGATIVE NEGATIVE  Rapid strep screen (not at [facility])     Status: None   Collection Time: 11/10/15 10:50 AM    Result Value Ref Range   Streptococcus, Group A Screen (Direct) NEGATIVE NEGATIVE    Comment: (NOTE) A Rapid Antigen test may result negative if the antigen level in the sample is below the detection level of this test. The FDA has not cleared this test as a stand-alone test therefore the rapid antigen negative result has reflexed to a Group A Strep culture.   Culture, group A strep     Status: None (Preliminary result)   Collection Time: 11/10/15 10:50 AM  Result Value Ref Range   Specimen Description THROAT    Special Requests NONE Reflexed from [id number]    Culture CULTURE REINCUBATED FOR BETTER GROWTH    Report Status PENDING   CBC     Status: Abnormal   Collection Time: 11/11/15  7:37 AM  Result Value Ref Range   WBC 3.5 (L) 4.0 - 10.5 K/uL   RBC 4.12 3.87 - 5.11 MIL/uL   Hemoglobin 11.1 (L) 12.0 - 15.0 g/dL   HCT 34.0 (L) 36.0 - 46.0 %   MCV 82.5 78.0 - 100.0 fL   MCH 26.9 26.0 - 34.0 pg   MCHC 32.6 30.0 - 36.0 g/dL   RDW 14.1 11.5 - 15.5 %   Platelets 251 150 - 400 K/uL      Component Value Date/Time   SDES THROAT 11/10/2015 1050   SPECREQUEST NONE Reflexed from [id number] 11/10/2015 1050   CULT CULTURE REINCUBATED FOR BETTER GROWTH 11/10/2015 1050   REPTSTATUS PENDING 11/10/2015 1050   Dg Chest 2 View  11/09/2015  CLINICAL DATA:  Fever for 6 days EXAM: CHEST  2 VIEW COMPARISON:  09/10/2013 FINDINGS: Normal heart size. Lungs clear. No pneumothorax. No pleural effusion. IMPRESSION: No active cardiopulmonary disease. Electronically Signed   By: Marybelle Killings M.D.   On: 11/09/2015 18:54   Ct Angio Neck W/cm &/or Wo/cm  11/10/2015  CLINICAL DATA:  Initial valuation for fever with right-sided neck swelling. EXAM: CT ANGIOGRAPHY NECK TECHNIQUE: Multidetector CT imaging of the neck was performed using the standard protocol during bolus administration of intravenous contrast. Multiplanar CT image reconstructions and MIPs were obtained to evaluate the vascular anatomy. Carotid stenosis  measurements (when applicable) are obtained utilizing NASCET criteria, using the distal internal carotid diameter as the denominator. CONTRAST:  39m ISOVUE-300 IOPAMIDOL (ISOVUE-300) INJECTION 61% COMPARISON:  Prior study from 08/27/2013. FINDINGS: Aortic arch: Visualized aortic arch of normal caliber with normal branch pattern. No high-grade stenosis at the origin of the great vessels. Subclavian arteries patent bilaterally. Right carotid system: Right common carotid artery patent from its origin to the bifurcation. Right ICA widely patent from the bifurcation to the skullbase. No stenosis, dissection, or vascular occlusion. Left carotid system: Left common carotid artery patent from its origin to the bifurcation. Left ICA widely patent  from the bifurcation to the skullbase. No dissection, stenosis, or vascular occlusion. Vertebral arteries:Both vertebral arteries arise from the subclavian arteries. Vertebral arteries widely patent without stenosis, dissection, or occlusion. Skeleton: No acute osseous abnormality. No worrisome lytic or blastic osseous lesions. Other neck: Visualized lungs are clear. Visualized superior mediastinum demonstrates no acute abnormality. Approximately 15 mm hypodense nodule within the left lobe of thyroid, indeterminate. Enlarged right level 2a lymph node measures approximately 2 cm in short axis. Multiple additional right level 2B nodes present more posteriorly within the right neck measuring up to 12 mm. Associated inflammatory stranding within this region. Few additional right level 2/3 nodes present more inferiorly measuring up to 8 mm. Few level 5 B nodes also present, measuring up to 1 cm. Right-sided supraclavicular nodes measure up to 8 mm. Findings may be reactive in nature. Suppurative adenitis could have this appearance given the inflammatory stranding. The internal jugular veins not well evaluated on this exam due to timing of the contrast bolus, but are grossly patent. No  significant left-sided adenopathy. Probable retention cyst noted within the left sphenoid sinus. IMPRESSION: 1. Normal CTA of the neck. 2. Extensive bulky right-sided cervical adenopathy with associated inflammatory stranding, consistent with suppurative adenitis/reactive nodal disease. Clinical followup to resolution recommended. No jugular vein thrombosis identified on this exam, although evaluation somewhat limited by timing of the contrast bolus. 3. Approximate 15 mm heterogeneous left thyroid nodule, indeterminate. Further evaluation with dedicated thyroid ultrasound recommended. This could be performed on a nonemergent basis. Electronically Signed   By: Jeannine Boga M.D.   On: 11/10/2015 01:35   Recent Results (from the past 240 hour(s))  Rapid strep screen (not at [facility])     Status: None   Collection Time: 11/10/15 10:50 AM  Result Value Ref Range Status   Streptococcus, Group A Screen (Direct) NEGATIVE NEGATIVE Final    Comment: (NOTE) A Rapid Antigen test may result negative if the antigen level in the sample is below the detection level of this test. The FDA has not cleared this test as a stand-alone test therefore the rapid antigen negative result has reflexed to a Group A Strep culture.   Culture, group A strep     Status: None (Preliminary result)   Collection Time: 11/10/15 10:50 AM  Result Value Ref Range Status   Specimen Description THROAT  Final   Special Requests NONE Reflexed from [id number]  Final   Culture CULTURE REINCUBATED FOR BETTER GROWTH  Final   Report Status PENDING  Incomplete      11/11/2015, 2:52 PM        Records and images were personally reviewed where available.

## 2015-11-12 ENCOUNTER — Observation Stay (HOSPITAL_COMMUNITY): Payer: 59

## 2015-11-12 DIAGNOSIS — D649 Anemia, unspecified: Secondary | ICD-10-CM | POA: Diagnosis present

## 2015-11-12 DIAGNOSIS — R509 Fever, unspecified: Secondary | ICD-10-CM | POA: Insufficient documentation

## 2015-11-12 DIAGNOSIS — E041 Nontoxic single thyroid nodule: Secondary | ICD-10-CM | POA: Diagnosis present

## 2015-11-12 DIAGNOSIS — M542 Cervicalgia: Secondary | ICD-10-CM | POA: Diagnosis present

## 2015-11-12 DIAGNOSIS — R768 Other specified abnormal immunological findings in serum: Secondary | ICD-10-CM | POA: Insufficient documentation

## 2015-11-12 DIAGNOSIS — A419 Sepsis, unspecified organism: Secondary | ICD-10-CM | POA: Diagnosis present

## 2015-11-12 DIAGNOSIS — R590 Localized enlarged lymph nodes: Secondary | ICD-10-CM | POA: Insufficient documentation

## 2015-11-12 DIAGNOSIS — E876 Hypokalemia: Secondary | ICD-10-CM | POA: Diagnosis present

## 2015-11-12 DIAGNOSIS — Z87891 Personal history of nicotine dependence: Secondary | ICD-10-CM | POA: Diagnosis not present

## 2015-11-12 DIAGNOSIS — L040 Acute lymphadenitis of face, head and neck: Secondary | ICD-10-CM | POA: Diagnosis present

## 2015-11-12 DIAGNOSIS — Z880 Allergy status to penicillin: Secondary | ICD-10-CM | POA: Diagnosis not present

## 2015-11-12 DIAGNOSIS — I96 Gangrene, not elsewhere classified: Secondary | ICD-10-CM | POA: Diagnosis not present

## 2015-11-12 LAB — HIV-1 RNA QUANT-NO REFLEX-BLD
HIV 1 RNA Quant: <20 copies/mL
LOG10 HIV-1 RNA: UNDETERMINED {Log_copies}/mL

## 2015-11-12 LAB — PROTIME-INR
INR: 1.11 (ref 0.00–1.49)
PROTHROMBIN TIME: 14.5 s (ref 11.6–15.2)

## 2015-11-12 LAB — TOXOPLASMA ANTIBODIES- IGG AND  IGM: Toxoplasma Antibody- IgM: <3 AU/mL (ref 0.0–7.9)

## 2015-11-12 LAB — ANCA TITERS
C-ANCA: <1:20 {titer}
P-ANCA: <1:20 {titer}

## 2015-11-12 LAB — APTT: aPTT: 26 seconds (ref 24–37)

## 2015-11-12 LAB — EBV AB TO VIRAL CAPSID AG PNL, IGG+IGM

## 2015-11-12 LAB — CMV IGM: CMV IgM: <30 AU/mL (ref 0.0–29.9)

## 2015-11-12 LAB — BARTONELLA ANTIBODY PANEL
B Quintana IgM: NEGATIVE titer
B henselae IgG: NEGATIVE titer
B henselae IgM: NEGATIVE titer

## 2015-11-12 LAB — EPSTEIN-BARR VIRUS VCA, IGM

## 2015-11-12 LAB — RHEUMATOID FACTOR: RHEUMATOID FACTOR: 45.9 [IU]/mL — AB (ref 0.0–13.9)

## 2015-11-12 LAB — BARTONELLA ANITBODY PANEL: B QUINTANA IGG: NEGATIVE {titer}

## 2015-11-12 LAB — HIV ANTIBODY (ROUTINE TESTING W REFLEX): HIV SCREEN 4TH GENERATION: NONREACTIVE

## 2015-11-12 LAB — EPSTEIN-BARR VIRUS VCA, IGG: EBV VCA IgG: >600 U/mL — ABNORMAL HIGH (ref 0.0–17.9)

## 2015-11-12 LAB — ANTINUCLEAR ANTIBODIES, IFA: ANTINUCLEAR ANTIBODIES, IFA: NEGATIVE

## 2015-11-12 LAB — ANGIOTENSIN CONVERTING ENZYME: Angiotensin-Converting Enzyme: 32 U/L (ref 14–82)

## 2015-11-12 LAB — CMV ANTIBODY, IGG (EIA)

## 2015-11-12 MED ORDER — FENTANYL CITRATE (PF) 100 MCG/2ML IJ SOLN
INTRAMUSCULAR | Status: AC
  Filled 2015-11-12: qty 2

## 2015-11-12 MED ORDER — LIDOCAINE HCL (PF) 1 % IJ SOLN
INTRAMUSCULAR | Status: AC
  Filled 2015-11-12: qty 10

## 2015-11-12 MED ORDER — FENTANYL CITRATE (PF) 100 MCG/2ML IJ SOLN
INTRAMUSCULAR | Status: AC | PRN
  Administered 2015-11-12: 25 ug via INTRAVENOUS
  Administered 2015-11-12: 50 ug via INTRAVENOUS

## 2015-11-12 MED ORDER — MIDAZOLAM HCL 2 MG/2ML IJ SOLN
INTRAMUSCULAR | Status: AC
  Filled 2015-11-12: qty 2

## 2015-11-12 MED ORDER — ENOXAPARIN SODIUM 40 MG/0.4ML ~~LOC~~ SOLN
40.0000 mg | SUBCUTANEOUS | Status: DC
  Filled 2015-11-12: qty 0.4

## 2015-11-12 MED ORDER — MIDAZOLAM HCL 2 MG/2ML IJ SOLN
INTRAMUSCULAR | Status: AC | PRN
  Administered 2015-11-12: 0.5 mg via INTRAVENOUS
  Administered 2015-11-12: 1 mg via INTRAVENOUS
  Administered 2015-11-12: 0.5 mg via INTRAVENOUS

## 2015-11-12 NOTE — Progress Notes (Signed)
PROGRESS NOTE        PATIENT DETAILS Name: Nicole Alexander Age: 40 y.o. Sex: female Date of Birth: 04-20-1976 Admit Date: 11/09/2015 Admitting Physician Norval Morton, MD [id number], CAMMIE, MD Outpatient Specialists:None  Brief Narrative: Patient is a 40 y.o. 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 Infectious disease following, plans are for lymph node biopsy today.  Subjective: Continues to be febrile. Slight improvement in the right neck pain and swelling.   Assessment/Plan: Principal Problem: Acute cervical suppurative adenitis: Minimally improved than on admission, continues to appear nontoxic appearing. But continues to have fever.  Mono/streptococcal screen negative, HIV negative. Not sure what's causing her to have recurrent cervical lymphadenitis-but work up in progress. RA factor +-will send out anti CCP. IgM CMV negative.Toxoplasma serology negative. ANA, EBV titers, ACE level along with HIV viral load are still pending. ID consulted-for USG guided lymph node biospy.  Active Problems: Hypokalemia: Repleted, will recheck in am  Anemia: Chronic issue, suspect could be related to menstrual loss. Follow  Thyroid nodule: Incidental finding on CT scan, will need outpatient workup. TSH within normal limits  DVT Prophylaxis: Prophylactic Lovenox   Code Status: Full code  Family Communication: None at bedside  Disposition Plan: Remain inpatient  Antimicrobial agents: IV Clindamycin 4/25>>  Procedures: None  CONSULTS:  ID  IR  Time spent: 25 minutes-Greater than 50% of this time was spent in counseling, explanation of diagnosis, planning of further management, and coordination of care.  MEDICATIONS: Anti-infectives    Start     Dose/Rate Route Frequency Ordered Stop   11/10/15 0400  clindamycin (CLEOCIN) IVPB 600 mg     600 mg 100 mL/hr over 30 Minutes Intravenous Every 8 hours 11/10/15 0320     11/10/15 0200  clindamycin (CLEOCIN) IVPB 600 mg     600 mg 100 mL/hr over 30 Minutes Intravenous  Once 11/10/15 0147 11/10/15 0311      Scheduled Meds: . clindamycin (CLEOCIN) IV  600 mg Intravenous Q8H  . [START ON 11/13/2015] enoxaparin (LOVENOX) injection  40 mg Subcutaneous Q24H  . sodium chloride  1,000 mL Intravenous Once   Continuous Infusions:  PRN Meds:.acetaminophen **OR** acetaminophen, ibuprofen, ondansetron **OR** ondansetron (ZOFRAN) IV, oxyCODONE-acetaminophen   PHYSICAL EXAM: Vital signs: Filed Vitals:   11/11/15 2101 11/11/15 2202 11/12/15 0215 11/12/15 0603  BP:  129/81  126/75  Pulse:  103  101  Temp: 100.4 F (38 C) 100 F (37.8 C) 99.2 F (37.3 C) 100.2 F (37.9 C)  TempSrc: Oral Oral Oral Oral  Resp:  18  20  Height:      Weight:      SpO2:  100%  98%   Filed Weights   11/09/15 1635  Weight: 112.038 kg (247 lb)   Body mass index is 36.46 kg/(m^2).   Gen Exam: Awake and alert with clear speech. Not in any distress.No stridor/no drooling noted. No trismus. Neck:Continues to have tender right neck area-with submandibular and right upper cervical adenopathy. Chest: B/L Clear.   CVS: S1 S2 Regular, no murmurs.  Abdomen: soft, BS +, non tender, non distended.  Extremities: no edema, lower extremities warm to touch. Neurologic: Non Focal.   Skin: No  Rash or lesions   Wounds: N/A.   LABORATORY DATA: CBC:  Recent Labs Lab 11/09/15 2133 11/10/15 0351 11/11/15 0737  WBC 5.2 4.4 3.5*  NEUTROABS 2.9  --   --   HGB 10.9* 10.2* 11.1*  HCT 33.2* 30.8* 34.0*  MCV 82.2 82.6 82.5  PLT 222 177 [id number]    Basic Metabolic Panel:  Recent Labs Lab 11/09/15 2133 11/10/15 0351  NA 137 133*  K 3.6 3.3*  CL 103 103  CO2 21* 18*  GLUCOSE 92 97  BUN <5* <5*  CREATININE  0.77 0.74  CALCIUM 8.7* 7.9*    GFR: Estimated Creatinine Clearance: 125.9 mL/min (by C-G formula based on Cr of 0.74).  Liver Function Tests:  Recent Labs Lab 11/09/15 2133  AST 30  ALT 41  ALKPHOS 42  BILITOT 0.3  PROT 7.2  ALBUMIN 3.3*   No results for input(s): LIPASE, AMYLASE in the last 168 hours. No results for input(s): AMMONIA in the last 168 hours.  Coagulation Profile: No results for input(s): INR, PROTIME in the last 168 hours.  Cardiac Enzymes: No results for input(s): CKTOTAL, CKMB, CKMBINDEX, TROPONINI in the last 168 hours.  BNP (last 3 results) No results for input(s): PROBNP in the last 8760 hours.  HbA1C: No results for input(s): HGBA1C in the last 72 hours.  CBG: No results for input(s): GLUCAP in the last 168 hours.  Lipid Profile: No results for input(s): CHOL, HDL, LDLCALC, TRIG, CHOLHDL, LDLDIRECT in the last 72 hours.  Thyroid Function Tests:  Recent Labs  11/10/15 0351  TSH 4.447    Anemia Panel:  Recent Labs  11/10/15 0351  TIBC 235*  IRON 17*    Urine analysis:    Component Value Date/Time   COLORURINE YELLOW 11/09/2015 2236   APPEARANCEUR CLEAR 11/09/2015 2236   LABSPEC 1.008 11/09/2015 2236   PHURINE 6.5 11/09/2015 2236   GLUCOSEU NEGATIVE 11/09/2015 2236   HGBUR NEGATIVE 11/09/2015 2236   BILIRUBINUR NEGATIVE 11/09/2015 2236   KETONESUR NEGATIVE 11/09/2015 2236   PROTEINUR NEGATIVE 11/09/2015 2236   UROBILINOGEN 4.0* 08/25/2013 1228   NITRITE NEGATIVE 11/09/2015 2236   LEUKOCYTESUR SMALL* 11/09/2015 2236    Sepsis Labs: Lactic Acid, Venous    Component Value Date/Time   LATICACIDVEN 1.38 11/09/2015 2135    MICROBIOLOGY: Recent Results (from the past 240 hour(s))  Culture, blood (routine x 2)     Status: None (Preliminary result)   Collection Time: 11/10/15  4:05 AM  Result Value Ref Range Status   Specimen Description BLOOD RIGHT ARM  Final   Special Requests IN PEDIATRIC BOTTLE 3ML  Final   Culture  NO GROWTH 1 DAY  Final   Report Status PENDING  Incomplete  Culture, blood (routine x 2)     Status: None (Preliminary result)   Collection Time: 11/10/15  4:15 AM  Result Value Ref Range Status   Specimen Description BLOOD LEFT HAND  Final   Special Requests IN PEDIATRIC BOTTLE 3ML  Final   Culture NO GROWTH 1 DAY  Final   Report Status PENDING  Incomplete  Rapid strep screen (not at [facility])     Status: None   Collection Time: 11/10/15 10:50 AM  Result Value Ref Range Status   Streptococcus, Group A Screen (Direct) NEGATIVE NEGATIVE Final    Comment: (NOTE) A Rapid Antigen test may result negative if the antigen level in the sample is below the detection level of this test. The FDA has not cleared this test as  a stand-alone test therefore the rapid antigen negative result has reflexed to a Group A Strep culture.   Culture, group A strep     Status: None (Preliminary result)   Collection Time: 11/10/15 10:50 AM  Result Value Ref Range Status   Specimen Description THROAT  Final   Special Requests NONE Reflexed from [id number]  Final   Culture CULTURE REINCUBATED FOR BETTER GROWTH  Final   Report Status PENDING  Incomplete    RADIOLOGY STUDIES/RESULTS: Dg Chest 2 View  11/09/2015  CLINICAL DATA:  Fever for 6 days EXAM: CHEST  2 VIEW COMPARISON:  09/10/2013 FINDINGS: Normal heart size. Lungs clear. No pneumothorax. No pleural effusion. IMPRESSION: No active cardiopulmonary disease. Electronically Signed   By: Marybelle Killings M.D.   On: 11/09/2015 18:54   Ct Angio Neck W/cm &/or Wo/cm  11/10/2015  CLINICAL DATA:  Initial valuation for fever with right-sided neck swelling. EXAM: CT ANGIOGRAPHY NECK TECHNIQUE: Multidetector CT imaging of the neck was performed using the standard protocol during bolus administration of intravenous contrast. Multiplanar CT image reconstructions and MIPs were obtained to evaluate the vascular anatomy. Carotid stenosis measurements (when applicable) are obtained  utilizing NASCET criteria, using the distal internal carotid diameter as the denominator. CONTRAST:  61mL ISOVUE-300 IOPAMIDOL (ISOVUE-300) INJECTION 61% COMPARISON:  Prior study from 08/27/2013. FINDINGS: Aortic arch: Visualized aortic arch of normal caliber with normal branch pattern. No high-grade stenosis at the origin of the great vessels. Subclavian arteries patent bilaterally. Right carotid system: Right common carotid artery patent from its origin to the bifurcation. Right ICA widely patent from the bifurcation to the skullbase. No stenosis, dissection, or vascular occlusion. Left carotid system: Left common carotid artery patent from its origin to the bifurcation. Left ICA widely patent from the bifurcation to the skullbase. No dissection, stenosis, or vascular occlusion. Vertebral arteries:Both vertebral arteries arise from the subclavian arteries. Vertebral arteries widely patent without stenosis, dissection, or occlusion. Skeleton: No acute osseous abnormality. No worrisome lytic or blastic osseous lesions. Other neck: Visualized lungs are clear. Visualized superior mediastinum demonstrates no acute abnormality. Approximately 15 mm hypodense nodule within the left lobe of thyroid, indeterminate. Enlarged right level 2a lymph node measures approximately 2 cm in short axis. Multiple additional right level 2B nodes present more posteriorly within the right neck measuring up to 12 mm. Associated inflammatory stranding within this region. Few additional right level 2/3 nodes present more inferiorly measuring up to 8 mm. Few level 5 B nodes also present, measuring up to 1 cm. Right-sided supraclavicular nodes measure up to 8 mm. Findings may be reactive in nature. Suppurative adenitis could have this appearance given the inflammatory stranding. The internal jugular veins not well evaluated on this exam due to timing of the contrast bolus, but are grossly patent. No significant left-sided adenopathy. Probable  retention cyst noted within the left sphenoid sinus. IMPRESSION: 1. Normal CTA of the neck. 2. Extensive bulky right-sided cervical adenopathy with associated inflammatory stranding, consistent with suppurative adenitis/reactive nodal disease. Clinical followup to resolution recommended. No jugular vein thrombosis identified on this exam, although evaluation somewhat limited by timing of the contrast bolus. 3. Approximate 15 mm heterogeneous left thyroid nodule, indeterminate. Further evaluation with dedicated thyroid ultrasound recommended. This could be performed on a nonemergent basis. Electronically Signed   By: Jeannine Boga M.D.   On: 11/10/2015 01:35       Oren Binet, MD  [facility] Pager:[phone]  If 7PM-7AM, please contact night-coverage [email or web address] Password [email or web address] 11/12/2015, 11:30 AM

## 2015-11-12 NOTE — Consult Note (Signed)
Chief Complaint: Patient was seen in consultation today for cervical lymphadenopathy biopsy Chief Complaint  Patient presents with  . Fever   at the request of Dr Oren Binet  Referring Physician(s): Dr Bobby Rumpf  Supervising Physician: Aletta Edouard  Patient Status: In-pt   History of Present Illness: Nicole Alexander is a 40 y.o. female   Recurrent cervical lymphadenopathy Known to ID since 2014 LAN off and on every several months Noted Rt sided cervical LAN last week +fever Neck swelling IMPRESSION: 1. Normal CTA of the neck. 2. Extensive bulky right-sided cervical adenopathy with associated inflammatory stranding, consistent with suppurative adenitis/reactive nodal disease. Clinical followup to resolution recommended. No jugular vein thrombosis identified on this exam, although evaluation somewhat limited by timing of the contrast bolus. 3. Approximate 15 mm heterogeneous left thyroid nodule, indeterminate. Further evaluation with dedicated thyroid ultrasound recommended. This could be performed on a nonemergent basis.  Request now for biopsy pf Rt cervical LAN Dr Kathlene Cote has reviewed imaging and approves procedure  History reviewed. No pertinent past medical history.  Past Surgical History  Procedure Laterality Date  . Breast surgery      breast reduction  . Cholecystectomy N/A 11/28/2012    Procedure: LAPAROSCOPIC CHOLECYSTECTOMY WITH INTRAOPERATIVE CHOLANGIOGRAM;  Surgeon: Zenovia Jarred, MD;  Location: [facility] OR;  Service: General;  Laterality: N/A;    Allergies: Penicillins  Medications: Prior to Admission medications   Not on File     Family History  Problem Relation Age of Onset  . Heart disease Mother     PTCA/stent  . Hypertension Mother   . Diabetes Mother   . Hyperlipidemia Mother   . Cancer Father     lung  . Hypertension Father   . Diabetes Father   . Hyperlipidemia Father   . Hypertension Brother   . Cancer  Maternal Grandfather     bone    Social History   Social History  . Marital Status: Married    Spouse Name: N/A  . Number of Children: N/A  . Years of Education: N/A   Social History Main Topics  . Smoking status: Former Smoker -- 0.50 packs/day for 4 years  . Smokeless tobacco: Never Used  . Alcohol Use: Yes     Comment: occasional  . Drug Use: No  . Sexual Activity: Yes     Comment: married since 12-2003   Other Topics Concern  . None   Social History Narrative    Review of Systems: A 12 point ROS discussed and pertinent positives are indicated in the HPI above.  All other systems are negative.  Review of Systems  Constitutional: Positive for fever, activity change and fatigue.  HENT: Negative for facial swelling, sore throat and trouble swallowing.   Respiratory: Negative for shortness of breath.   Neurological: Negative for weakness.  Psychiatric/Behavioral: Negative for behavioral problems and confusion.    Vital Signs: BP 126/75 mmHg  Pulse 101  Temp(Src) 100.2 F (37.9 C) (Oral)  Resp 20  Ht 5\' 9"  (1.753 m)  Wt 247 lb (112.038 kg)  BMI 36.46 kg/m2  SpO2 98%  LMP 10/23/2015  Physical Exam  Constitutional: She is oriented to person, place, and time.  Cardiovascular: Normal rate and regular rhythm.   Pulmonary/Chest: Effort normal and breath sounds normal.  Abdominal: Soft. Bowel sounds are normal.  Musculoskeletal: Normal range of motion.  Neurological: She is alert and oriented to person, place, and time.  Skin: Skin is warm and dry.  Rt cervical LN palpable Sl tender  Psychiatric: She has a normal mood and affect. Her behavior is normal. Judgment and thought content normal.  Nursing note and vitals reviewed.   Mallampati Score:  MD Evaluation Airway: WNL Heart: WNL Abdomen: WNL Chest/ Lungs: WNL ASA  Classification: 2 Mallampati/Airway Score: Two  Imaging: Dg Chest 2 View  11/09/2015  CLINICAL DATA:  Fever for 6 days EXAM: CHEST  2 VIEW  COMPARISON:  09/10/2013 FINDINGS: Normal heart size. Lungs clear. No pneumothorax. No pleural effusion. IMPRESSION: No active cardiopulmonary disease. Electronically Signed   By: Marybelle Killings M.D.   On: 11/09/2015 18:54   Ct Angio Neck W/cm &/or Wo/cm  11/10/2015  CLINICAL DATA:  Initial valuation for fever with right-sided neck swelling. EXAM: CT ANGIOGRAPHY NECK TECHNIQUE: Multidetector CT imaging of the neck was performed using the standard protocol during bolus administration of intravenous contrast. Multiplanar CT image reconstructions and MIPs were obtained to evaluate the vascular anatomy. Carotid stenosis measurements (when applicable) are obtained utilizing NASCET criteria, using the distal internal carotid diameter as the denominator. CONTRAST:  58mL ISOVUE-300 IOPAMIDOL (ISOVUE-300) INJECTION 61% COMPARISON:  Prior study from 08/27/2013. FINDINGS: Aortic arch: Visualized aortic arch of normal caliber with normal branch pattern. No high-grade stenosis at the origin of the great vessels. Subclavian arteries patent bilaterally. Right carotid system: Right common carotid artery patent from its origin to the bifurcation. Right ICA widely patent from the bifurcation to the skullbase. No stenosis, dissection, or vascular occlusion. Left carotid system: Left common carotid artery patent from its origin to the bifurcation. Left ICA widely patent from the bifurcation to the skullbase. No dissection, stenosis, or vascular occlusion. Vertebral arteries:Both vertebral arteries arise from the subclavian arteries. Vertebral arteries widely patent without stenosis, dissection, or occlusion. Skeleton: No acute osseous abnormality. No worrisome lytic or blastic osseous lesions. Other neck: Visualized lungs are clear. Visualized superior mediastinum demonstrates no acute abnormality. Approximately 15 mm hypodense nodule within the left lobe of thyroid, indeterminate. Enlarged right level 2a lymph node measures  approximately 2 cm in short axis. Multiple additional right level 2B nodes present more posteriorly within the right neck measuring up to 12 mm. Associated inflammatory stranding within this region. Few additional right level 2/3 nodes present more inferiorly measuring up to 8 mm. Few level 5 B nodes also present, measuring up to 1 cm. Right-sided supraclavicular nodes measure up to 8 mm. Findings may be reactive in nature. Suppurative adenitis could have this appearance given the inflammatory stranding. The internal jugular veins not well evaluated on this exam due to timing of the contrast bolus, but are grossly patent. No significant left-sided adenopathy. Probable retention cyst noted within the left sphenoid sinus. IMPRESSION: 1. Normal CTA of the neck. 2. Extensive bulky right-sided cervical adenopathy with associated inflammatory stranding, consistent with suppurative adenitis/reactive nodal disease. Clinical followup to resolution recommended. No jugular vein thrombosis identified on this exam, although evaluation somewhat limited by timing of the contrast bolus. 3. Approximate 15 mm heterogeneous left thyroid nodule, indeterminate. Further evaluation with dedicated thyroid ultrasound recommended. This could be performed on a nonemergent basis. Electronically Signed   By: Jeannine Boga M.D.   On: 11/10/2015 01:35    Labs:  CBC:  Recent Labs  02/13/15 1522 11/09/15 2133 11/10/15 0351 11/11/15 0737  WBC 16.0* 5.2 4.4 3.5*  HGB 11.3* 10.9* 10.2* 11.1*  HCT 33.6* 33.2* 30.8* 34.0*  PLT 317 222 177 251    COAGS: No results for input(s): INR, APTT in  the last 8760 hours.  BMP:  Recent Labs  11/09/15 2133 11/10/15 0351  NA 137 133*  K 3.6 3.3*  CL 103 103  CO2 21* 18*  GLUCOSE 92 97  BUN <5* <5*  CALCIUM 8.7* 7.9*  CREATININE 0.77 0.74  GFRNONAA >60 >60  GFRAA >60 >60    LIVER FUNCTION TESTS:  Recent Labs  11/09/15 2133  BILITOT 0.3  AST 30  ALT 41  ALKPHOS 42    PROT 7.2  ALBUMIN 3.3*    TUMOR MARKERS: No results for input(s): AFPTM, CEA, CA199, CHROMGRNA in the last 8760 hours.  Assessment and Plan:  Recurrent cervical LAN Fever ID has requested bx Scheduled now for same Risks and Benefits discussed with the patient including, but not limited to bleeding, infection, damage to adjacent structures or low yield requiring additional tests. All of the patient's questions were answered, patient is agreeable to proceed. Consent signed and in chart.   Thank you for this interesting consult.  I greatly enjoyed meeting Aubryana C Alexander and look forward to participating in their care.  A copy of this report was sent to the requesting provider on this date.  Electronically Signed: Monia Sabal A 11/12/2015, 9:45 AM   I spent a total of 40 Minutes    in face to face in clinical consultation, greater than 50% of which was counseling/coordinating care for cervical LAN bx

## 2015-11-12 NOTE — Progress Notes (Signed)
Subjective: No new complaints, still with very painful cervical lymphadenopathy   Antibiotics:  Anti-infectives    Start     Dose/Rate Route Frequency Ordered Stop   11/10/15 0400  clindamycin (CLEOCIN) IVPB 600 mg     600 mg 100 mL/hr over 30 Minutes Intravenous Every 8 hours 11/10/15 0320     11/10/15 0200  clindamycin (CLEOCIN) IVPB 600 mg     600 mg 100 mL/hr over 30 Minutes Intravenous  Once 11/10/15 0147 11/10/15 0311      Medications: Scheduled Meds: . clindamycin (CLEOCIN) IV  600 mg Intravenous Q8H  . [START ON 11/13/2015] enoxaparin (LOVENOX) injection  40 mg Subcutaneous Q24H  . fentaNYL      . lidocaine (PF)      . midazolam      . sodium chloride  1,000 mL Intravenous Once   Continuous Infusions:  PRN Meds:.acetaminophen **OR** acetaminophen, fentaNYL, ibuprofen, midazolam, ondansetron **OR** ondansetron (ZOFRAN) IV, oxyCODONE-acetaminophen    Objective: Weight change:   Intake/Output Summary (Last 24 hours) at 11/12/15 1359 Last data filed at 11/12/15 0640  Gross per 24 hour  Intake    150 ml  Output      0 ml  Net    150 ml   Blood pressure 130/90, pulse 104, temperature 100.2 F (37.9 C), temperature source Oral, resp. rate 35, height 5\' 9"  (1.753 m), weight 247 lb (112.038 kg), last menstrual period 10/23/2015, SpO2 99 %. Temp:  [99.2 F (37.3 C)-102.5 F (39.2 C)] 100.2 F (37.9 C) (04/27 0603) Pulse Rate:  [100-107] 104 (04/27 1355) Resp:  [18-35] 35 (04/27 1355) BP: (118-142)/(75-96) 130/90 mmHg (04/27 1355) SpO2:  [98 %-100 %] 99 % (04/27 1355)  Physical Exam: General: Alert and awake, oriented x3, not in any acute distress. HEENT: anicteric sclera,EOMI Neck : Exquisitely tender and bulky cervical lymphadenopathy CVS regular rate, normal r,  no murmur rubs or gallops Chest: clear to auscultation bilaterally, no wheezing, rales or rhonchi Abdomen: soft nontender, nondistended, normal bowel sounds, Extremities: no  clubbing or  edema noted bilaterally Skin: no rashes Neuro: nonfocal  CBC:  CBC Latest Ref Rng 11/11/2015 11/10/2015 11/09/2015  WBC 4.0 - 10.5 K/uL 3.5(L) 4.4 5.2  Hemoglobin 12.0 - 15.0 g/dL 11.1(L) 10.2(L) 10.9(L)  Hematocrit 36.0 - 46.0 % 34.0(L) 30.8(L) 33.2(L)  Platelets 150 - 400 K/uL 251 177 222       BMET  Recent Labs  11/09/15 2133 11/10/15 0351  NA 137 133*  K 3.6 3.3*  CL 103 103  CO2 21* 18*  GLUCOSE 92 97  BUN <5* <5*  CREATININE 0.77 0.74  CALCIUM 8.7* 7.9*     Liver Panel   Recent Labs  11/09/15 2133  PROT 7.2  ALBUMIN 3.3*  AST 30  ALT 41  ALKPHOS 42  BILITOT 0.3       Sedimentation Rate No results for input(s): ESRSEDRATE in the last 72 hours. C-Reactive Protein No results for input(s): CRP in the last 72 hours.  Micro Results: Recent Results (from the past 720 hour(s))  Culture, blood (routine x 2)     Status: None (Preliminary result)   Collection Time: 11/10/15  4:05 AM  Result Value Ref Range Status   Specimen Description BLOOD RIGHT ARM  Final   Special Requests IN PEDIATRIC BOTTLE 3ML  Final   Culture NO GROWTH 2 DAYS  Final   Report Status PENDING  Incomplete  Culture, blood (routine x 2)  Status: None (Preliminary result)   Collection Time: 11/10/15  4:15 AM  Result Value Ref Range Status   Specimen Description BLOOD LEFT HAND  Final   Special Requests IN PEDIATRIC BOTTLE 3ML  Final   Culture NO GROWTH 2 DAYS  Final   Report Status PENDING  Incomplete  Rapid strep screen (not at [facility])     Status: None   Collection Time: 11/10/15 10:50 AM  Result Value Ref Range Status   Streptococcus, Group A Screen (Direct) NEGATIVE NEGATIVE Final    Comment: (NOTE) A Rapid Antigen test may result negative if the antigen level in the sample is below the detection level of this test. The FDA has not cleared this test as a stand-alone test therefore the rapid antigen negative result has reflexed to a Group A Strep culture.   Culture, group A  strep     Status: None (Preliminary result)   Collection Time: 11/10/15 10:50 AM  Result Value Ref Range Status   Specimen Description THROAT  Final   Special Requests NONE Reflexed from [id number]  Final   Culture CULTURE REINCUBATED FOR BETTER GROWTH  Final   Report Status PENDING  Incomplete    Studies/Results: No results found.    Assessment/Plan:  INTERVAL HISTORY:  To have biopsy today   Principal Problem:   Sepsis ([place]) Active Problems:   Acute cervical adenitis   Neck pain on right side   Thyroid nodule   Anemia    Jillene C Ross-Clayton is a 40 y.o. female with  Hx several years ago of Lemierre's syndrome with tonsillitis in 2015 admitted with fevers and painful cervical LA but without abscess or clot this time. Her RA is +and she is fevering through her clindamycin  #1 Painful cervical LA: I suspect this could be an Auto immune process  --she is to get IR guided LN biopsy today --she may need an excisional LN biopsy --make sure that specimen sent to   [facility]  To micro for  FUNGAL CULTURES AFB CULTURES Bacterial cultures  Serologies pending      Alcide Evener 11/12/2015, 1:59 PM

## 2015-11-12 NOTE — Procedures (Signed)
Interventional Radiology Procedure Note  Procedure:  Ultrasound guided core biopsy of right cervical lymph node  Complications:  None  Estimated Blood Loss: 10 mL  Largest posterior right cervical node sampled under US guidance.  18 G core bx x 5 submitted in saline.  Venetia Night. Kathlene Cote, M.D Pager:  [phone]

## 2015-11-12 NOTE — Progress Notes (Signed)
Pt temp 103. Tylenol 650mg  given. MD aware. Will reassess.

## 2015-11-13 ENCOUNTER — Inpatient Hospital Stay (HOSPITAL_COMMUNITY): Payer: 59

## 2015-11-13 ENCOUNTER — Encounter (HOSPITAL_COMMUNITY): Payer: Self-pay | Admitting: Radiology

## 2015-11-13 DIAGNOSIS — I96 Gangrene, not elsewhere classified: Secondary | ICD-10-CM

## 2015-11-13 LAB — BASIC METABOLIC PANEL
ANION GAP: 11 (ref 5–15)
BUN: <5 mg/dL — ABNORMAL LOW (ref 6–20)
CALCIUM: 8.8 mg/dL — AB (ref 8.9–10.3)
CO2: 22 mmol/L (ref 22–32)
CREATININE: 0.8 mg/dL (ref 0.44–1.00)
Chloride: 104 mmol/L (ref 101–111)
GLUCOSE: 112 mg/dL — AB (ref 65–99)
Potassium: 3.7 mmol/L (ref 3.5–5.1)
Sodium: 137 mmol/L (ref 135–145)

## 2015-11-13 LAB — ACID FAST SMEAR (AFB): ACID FAST SMEAR - AFSCU2: NEGATIVE

## 2015-11-13 LAB — CULTURE, GROUP A STREP (THRC)

## 2015-11-13 MED ORDER — IOPAMIDOL (ISOVUE-300) INJECTION 61%
INTRAVENOUS | Status: AC
  Administered 2015-11-13: 75 mL
  Filled 2015-11-13: qty 75

## 2015-11-13 NOTE — Progress Notes (Signed)
PROGRESS NOTE        PATIENT DETAILS Name: Nicole Alexander Age: 40 y.o. Sex: female Date of Birth: [birth year] Admit Date: 11/09/2015 Admitting Physician Norval Morton, MD SX:[id number], CAMMIE, MD Outpatient Specialists:None  Brief Narrative: Patient is a 40 y.o. 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 Infectious disease following. Biopsy obtained, final results are pending.   Subjective: Fever continues but curve slightly better. Slight improvement in the right neck pain and swelling.   Assessment/Plan: Principal Problem: Acute cervical suppurative adenitis: Minimally improved than on admission, continues to appear nontoxic appearing. But continues to have fever.  Mono/streptococcal screen negative, HIV negative. Not sure what's causing her to have recurrent cervical lymphadenitis-but work up in progress.    RA factor positive, anti CCP pending.   IgM CMV negative, CMV antibody IgG positive   Toxoplasma serology negative   ANA negative  ANCA titers negative  ACE  negative  EBV IgM negative but EBV IgG positive  HIV viral load negative  Bartonella antibody panel negative  ID consulted-and underwent USG guided lymph node biospy on 4/27 - results pending. Fungus culture, Acid fast smear, and quantiferon tb gold are still pending. If biopsy results are unrevealing, she may need excisional biopsy.  Continue empiric clindamycin, await culture data and further recommendations from infectious disease  Active Problems: Hypokalemia: Appears to have resolved.   Anemia: Chronic issue, suspect could be related to menstrual loss. Follow  Thyroid nodule: Incidental finding on CT scan, will need outpatient workup. TSH within normal limits  DVT  Prophylaxis: Prophylactic Lovenox   Code Status: Full code  Family Communication: None at bedside  Disposition Plan: Remain inpatient  Antimicrobial agents: IV Clindamycin 4/25>>  Procedures: None  CONSULTS:  ID  IR  Time spent: 25 minutes-Greater than 50% of this time was spent in counseling, explanation of diagnosis, planning of further management, and coordination of care.  MEDICATIONS: Anti-infectives    Start     Dose/Rate Route Frequency Ordered Stop   11/10/15 0400  clindamycin (CLEOCIN) IVPB 600 mg     600 mg 100 mL/hr over 30 Minutes Intravenous Every 8 hours 11/10/15 0320     11/10/15 0200  clindamycin (CLEOCIN) IVPB 600 mg     600 mg 100 mL/hr over 30 Minutes Intravenous  Once 11/10/15 0147 11/10/15 0311      Scheduled Meds: . clindamycin (CLEOCIN) IV  600 mg Intravenous Q8H  . enoxaparin (LOVENOX) injection  40 mg Subcutaneous Q24H  . sodium chloride  1,000 mL Intravenous Once   Continuous Infusions:  PRN Meds:.acetaminophen **OR** acetaminophen, ibuprofen, ondansetron **OR** ondansetron (ZOFRAN) IV, oxyCODONE-acetaminophen   PHYSICAL EXAM: Vital signs: Filed Vitals:   11/12/15 2027 11/12/15 2245 11/13/15 0601 11/13/15 1358  BP:   118/78 123/72  Pulse:   94 95  Temp: 102.9 F (39.4 C) 99.1 F (37.3 C) 98.1 F (36.7 C) 98.8 F (37.1 C)  TempSrc: Tympanic Oral    Resp:   24 16  Height:      Weight:      SpO2:   95% 100%   Filed Weights   11/09/15 1635  Weight: 112.038 kg (247 lb)   Body mass index is  36.46 kg/(m^2).   Gen Exam: Awake and alert with clear speech. Not in any distress.No stridor/no drooling noted. No trismus. Neck:Continues to have tender right neck area-with submandibular and right upper cervical adenopathy. Chest: B/L Clear.   CVS: S1 S2 Regular, no murmurs.  Abdomen: soft, BS +, non tender, non distended.  Extremities: no edema, lower extremities warm to touch. Neurologic: Non Focal.   Skin: No Rash or lesions     Wounds: N/A.   LABORATORY DATA: CBC:  Recent Labs Lab 11/09/15 2133 11/10/15 0351 11/11/15 0737  WBC 5.2 4.4 3.5*  NEUTROABS 2.9  --   --   HGB 10.9* 10.2* 11.1*  HCT 33.2* 30.8* 34.0*  MCV 82.2 82.6 82.5  PLT 222 177 [id number]    Basic Metabolic Panel:  Recent Labs Lab 11/09/15 2133 11/10/15 0351 11/13/15 0709  NA 137 133* 137  K 3.6 3.3* 3.7  CL 103 103 104  CO2 21* 18* 22  GLUCOSE 92 97 112*  BUN <5* <5* <5*  CREATININE 0.77 0.74 0.80  CALCIUM 8.7* 7.9* 8.8*    GFR: Estimated Creatinine Clearance: 125.9 mL/min (by C-G formula based on Cr of 0.8).  Liver Function Tests:  Recent Labs Lab 11/09/15 2133  AST 30  ALT 41  ALKPHOS 42  BILITOT 0.3  PROT 7.2  ALBUMIN 3.3*   No results for input(s): LIPASE, AMYLASE in the last 168 hours. No results for input(s): AMMONIA in the last 168 hours.  Coagulation Profile:  Recent Labs Lab 11/12/15 1136  INR 1.11    Cardiac Enzymes: No results for input(s): CKTOTAL, CKMB, CKMBINDEX, TROPONINI in the last 168 hours.  BNP (last 3 results) No results for input(s): PROBNP in the last 8760 hours.  HbA1C: No results for input(s): HGBA1C in the last 72 hours.  CBG: No results for input(s): GLUCAP in the last 168 hours.  Lipid Profile: No results for input(s): CHOL, HDL, LDLCALC, TRIG, CHOLHDL, LDLDIRECT in the last 72 hours.  Thyroid Function Tests: No results for input(s): TSH, T4TOTAL, FREET4, T3FREE, THYROIDAB in the last 72 hours.  Anemia Panel: No results for input(s): VITAMINB12, FOLATE, FERRITIN, TIBC, IRON, RETICCTPCT in the last 72 hours.  Urine analysis:    Component Value Date/Time   COLORURINE YELLOW 11/09/2015 2236   APPEARANCEUR CLEAR 11/09/2015 2236   LABSPEC 1.008 11/09/2015 2236   PHURINE 6.5 11/09/2015 2236   GLUCOSEU NEGATIVE 11/09/2015 2236   HGBUR NEGATIVE 11/09/2015 2236   BILIRUBINUR NEGATIVE 11/09/2015 2236   KETONESUR NEGATIVE 11/09/2015 2236   PROTEINUR NEGATIVE 11/09/2015  2236   UROBILINOGEN 4.0* 08/25/2013 1228   NITRITE NEGATIVE 11/09/2015 2236   LEUKOCYTESUR SMALL* 11/09/2015 2236    Sepsis Labs: Lactic Acid, Venous    Component Value Date/Time   LATICACIDVEN 1.38 11/09/2015 2135    MICROBIOLOGY: Recent Results (from the past 240 hour(s))  Culture, blood (routine x 2)     Status: None (Preliminary result)   Collection Time: 11/10/15  4:05 AM  Result Value Ref Range Status   Specimen Description BLOOD RIGHT ARM  Final   Special Requests IN PEDIATRIC BOTTLE 3ML  Final   Culture NO GROWTH 3 DAYS  Final   Report Status PENDING  Incomplete  Culture, blood (routine x 2)     Status: None (Preliminary result)   Collection Time: 11/10/15  4:15 AM  Result Value Ref Range Status   Specimen Description BLOOD LEFT HAND  Final   Special Requests IN PEDIATRIC BOTTLE 3ML  Final  Culture NO GROWTH 3 DAYS  Final   Report Status PENDING  Incomplete  Rapid strep screen (not at [facility])     Status: None   Collection Time: 11/10/15 10:50 AM  Result Value Ref Range Status   Streptococcus, Group A Screen (Direct) NEGATIVE NEGATIVE Final    Comment: (NOTE) A Rapid Antigen test may result negative if the antigen level in the sample is below the detection level of this test. The FDA has not cleared this test as a stand-alone test therefore the rapid antigen negative result has reflexed to a Group A Strep culture.   Culture, group A strep     Status: None   Collection Time: 11/10/15 10:50 AM  Result Value Ref Range Status   Specimen Description THROAT  Final   Special Requests NONE Reflexed from [id number]  Final   Culture FEW STREPTOCOCCUS,BETA HEMOLYTIC NOT GROUP A  Final   Report Status 11/13/2015 FINAL  Final  Tissue culture     Status: None (Preliminary result)   Collection Time: 11/12/15  2:13 PM  Result Value Ref Range Status   Specimen Description TISSUE LYMPH NODE  Final   Special Requests RIGHT CERVICAL LYMPH NODE  Final   Gram Stain   Final    RARE WBC  PRESENT, PREDOMINANTLY PMN NO ORGANISMS SEEN Performed at [facility]    Culture PENDING  Incomplete   Report Status PENDING  Incomplete    RADIOLOGY STUDIES/RESULTS: Dg Chest 2 View  11/09/2015  CLINICAL DATA:  Fever for 6 days EXAM: CHEST  2 VIEW COMPARISON:  09/10/2013 FINDINGS: Normal heart size. Lungs clear. No pneumothorax. No pleural effusion. IMPRESSION: No active cardiopulmonary disease. Electronically Signed   By: Marybelle Killings M.D.   On: 11/09/2015 18:54   Ct Angio Neck W/cm &/or Wo/cm  11/10/2015  CLINICAL DATA:  Initial valuation for fever with right-sided neck swelling. EXAM: CT ANGIOGRAPHY NECK TECHNIQUE: Multidetector CT imaging of the neck was performed using the standard protocol during bolus administration of intravenous contrast. Multiplanar CT image reconstructions and MIPs were obtained to evaluate the vascular anatomy. Carotid stenosis measurements (when applicable) are obtained utilizing NASCET criteria, using the distal internal carotid diameter as the denominator. CONTRAST:  46mL ISOVUE-300 IOPAMIDOL (ISOVUE-300) INJECTION 61% COMPARISON:  Prior study from 08/27/2013. FINDINGS: Aortic arch: Visualized aortic arch of normal caliber with normal branch pattern. No high-grade stenosis at the origin of the great vessels. Subclavian arteries patent bilaterally. Right carotid system: Right common carotid artery patent from its origin to the bifurcation. Right ICA widely patent from the bifurcation to the skullbase. No stenosis, dissection, or vascular occlusion. Left carotid system: Left common carotid artery patent from its origin to the bifurcation. Left ICA widely patent from the bifurcation to the skullbase. No dissection, stenosis, or vascular occlusion. Vertebral arteries:Both vertebral arteries arise from the subclavian arteries. Vertebral arteries widely patent without stenosis, dissection, or occlusion. Skeleton: No acute osseous abnormality. No worrisome lytic or  blastic osseous lesions. Other neck: Visualized lungs are clear. Visualized superior mediastinum demonstrates no acute abnormality. Approximately 15 mm hypodense nodule within the left lobe of thyroid, indeterminate. Enlarged right level 2a lymph node measures approximately 2 cm in short axis. Multiple additional right level 2B nodes present more posteriorly within the right neck measuring up to 12 mm. Associated inflammatory stranding within this region. Few additional right level 2/3 nodes present more inferiorly measuring up to 8 mm. Few level 5 B nodes also present, measuring up to 1 cm. Right-sided  supraclavicular nodes measure up to 8 mm. Findings may be reactive in nature. Suppurative adenitis could have this appearance given the inflammatory stranding. The internal jugular veins not well evaluated on this exam due to timing of the contrast bolus, but are grossly patent. No significant left-sided adenopathy. Probable retention cyst noted within the left sphenoid sinus. IMPRESSION: 1. Normal CTA of the neck. 2. Extensive bulky right-sided cervical adenopathy with associated inflammatory stranding, consistent with suppurative adenitis/reactive nodal disease. Clinical followup to resolution recommended. No jugular vein thrombosis identified on this exam, although evaluation somewhat limited by timing of the contrast bolus. 3. Approximate 15 mm heterogeneous left thyroid nodule, indeterminate. Further evaluation with dedicated thyroid ultrasound recommended. This could be performed on a nonemergent basis. Electronically Signed   By: Jeannine Boga M.D.   On: 11/10/2015 01:35   US Biopsy  11/12/2015  CLINICAL DATA:  Painful right cervical lymphadenopathy and history of Lemierre syndrome. EXAM: ULTRASOUND GUIDED CORE BIOPSY OF RIGHT CERVICAL LYMPH NODE COMPARISON:  CTA of the neck on 11/09/2015 MEDICATIONS: 2.0 mg IV Versed; 75 mcg IV Fentanyl Total Moderate Sedation Time: 25 minutes. The patient's level  of consciousness and physiologic status were continuously monitored during the procedure by Radiology nursing. PROCEDURE: The procedure, risks, benefits, and alternatives were explained to the patient. Questions regarding the procedure were encouraged and answered. The patient understands and consents to the procedure. A time-out was performed prior to initiating the procedure. The right neck was prepped with chlorhexidine in a sterile fashion, and a sterile drape was applied covering the operative field. A sterile gown and sterile gloves were used for the procedure. Local anesthesia was provided with 1% Lidocaine. Ultrasound was used to localize right cervical lymph nodes. Under direct ultrasound guidance, a total of five 18 gauge core biopsy samples were obtained through an enlarged lymph node and placed in saline. COMPLICATIONS: None. FINDINGS: Multiple right-sided cervical lymph nodes are visualized. The largest measures approximately 1.8 cm and was sampled. IMPRESSION: Ultrasound-guided core biopsy performed of an enlarged right cervical lymph node. Electronically Signed   By: Aletta Edouard M.D.   On: 11/12/2015 16:19     LOS: 1 day   Oren Binet, MD  [facility] Pager:[phone]  If 7PM-7AM, please contact night-coverage [email or web address] Password [email or web address] 11/13/2015, 2:17 PM

## 2015-11-13 NOTE — Clinical Documentation Improvement (Signed)
Internal Medicine  Can the diagnosis of systemic infection be further specified?   Sepsis - specify causative organism if known}  Other  Clinically Undetermined  Document any associated diagnoses/conditions.  Supporting Information: Acute cervical suppurative adenitis CT scan on admission shows suppurative adenitis 11/11/15 :ID consult 11/12/15 : lymph node biopsy   Please exercise your independent, professional judgment when responding. A specific answer is not anticipated or expected. Please update your documentation within the medical record to reflect your response to this query. Thank you  Thank You,  [place] [phone]

## 2015-11-13 NOTE — Progress Notes (Signed)
Subjective: No new complaints, still with very painful cervical lymphadenopathy, chills, rigors   Antibiotics:  Anti-infectives    Start     Dose/Rate Route Frequency Ordered Stop   11/10/15 0400  clindamycin (CLEOCIN) IVPB 600 mg     600 mg 100 mL/hr over 30 Minutes Intravenous Every 8 hours 11/10/15 0320     11/10/15 0200  clindamycin (CLEOCIN) IVPB 600 mg     600 mg 100 mL/hr over 30 Minutes Intravenous  Once 11/10/15 0147 11/10/15 0311      Medications: Scheduled Meds: . clindamycin (CLEOCIN) IV  600 mg Intravenous Q8H  . enoxaparin (LOVENOX) injection  40 mg Subcutaneous Q24H  . sodium chloride  1,000 mL Intravenous Once   Continuous Infusions:  PRN Meds:.acetaminophen **OR** acetaminophen, ondansetron **OR** ondansetron (ZOFRAN) IV, oxyCODONE-acetaminophen    Objective: Weight change:   Intake/Output Summary (Last 24 hours) at 11/13/15 1557 Last data filed at 11/13/15 1431  Gross per 24 hour  Intake    480 ml  Output      0 ml  Net    480 ml   Blood pressure 123/72, pulse 95, temperature 98.8 F (37.1 C), temperature source Oral, resp. rate 16, height 5\' 9"  (1.753 m), weight 247 lb (112.038 kg), last menstrual period 10/23/2015, SpO2 100 %. Temp:  [98.1 F (36.7 C)-102.9 F (39.4 C)] 98.8 F (37.1 C) (04/28 1358) Pulse Rate:  [94-106] 95 (04/28 1358) Resp:  [16-24] 16 (04/28 1358) BP: (109-132)/(72-95) 123/72 mmHg (04/28 1358) SpO2:  [95 %-100 %] 100 % (04/28 1358)  Physical Exam: General: Alert and awake, oriented x3, not in any acute distress. HEENT: anicteric sclera,EOMI Neck : Exquisitely tender and bulky cervical lymphadenopathy she has Ended covering the needle insertion site. CVS regular rate, normal r,  no murmur rubs or gallops Chest: clear to auscultation bilaterally, no wheezing, rales or rhonchi Abdomen: soft nontender, nondistended, normal bowel sounds, Skin: no rashes Neuro: nonfocal  CBC:  CBC Latest Ref Rng 11/11/2015  11/10/2015 11/09/2015  WBC 4.0 - 10.5 K/uL 3.5(L) 4.4 5.2  Hemoglobin 12.0 - 15.0 g/dL 11.1(L) 10.2(L) 10.9(L)  Hematocrit 36.0 - 46.0 % 34.0(L) 30.8(L) 33.2(L)  Platelets 150 - 400 K/uL 251 177 222       BMET  Recent Labs  11/13/15 0709  NA 137  K 3.7  CL 104  CO2 22  GLUCOSE 112*  BUN <5*  CREATININE 0.80  CALCIUM 8.8*     Liver Panel  No results for input(s): PROT, ALBUMIN, AST, ALT, ALKPHOS, BILITOT, BILIDIR, IBILI in the last 72 hours.     Sedimentation Rate No results for input(s): ESRSEDRATE in the last 72 hours. C-Reactive Protein No results for input(s): CRP in the last 72 hours.  Micro Results: Recent Results (from the past 720 hour(s))  Culture, blood (routine x 2)     Status: None (Preliminary result)   Collection Time: 11/10/15  4:05 AM  Result Value Ref Range Status   Specimen Description BLOOD RIGHT ARM  Final   Special Requests IN PEDIATRIC BOTTLE 3ML  Final   Culture NO GROWTH 3 DAYS  Final   Report Status PENDING  Incomplete  Culture, blood (routine x 2)     Status: None (Preliminary result)   Collection Time: 11/10/15  4:15 AM  Result Value Ref Range Status   Specimen Description BLOOD LEFT HAND  Final   Special Requests IN PEDIATRIC BOTTLE 3ML  Final   Culture NO GROWTH 3  DAYS  Final   Report Status PENDING  Incomplete  Rapid strep screen (not at [facility])     Status: None   Collection Time: 11/10/15 10:50 AM  Result Value Ref Range Status   Streptococcus, Group A Screen (Direct) NEGATIVE NEGATIVE Final    Comment: (NOTE) A Rapid Antigen test may result negative if the antigen level in the sample is below the detection level of this test. The FDA has not cleared this test as a stand-alone test therefore the rapid antigen negative result has reflexed to a Group A Strep culture.   Culture, group A strep     Status: None   Collection Time: 11/10/15 10:50 AM  Result Value Ref Range Status   Specimen Description THROAT  Final   Special  Requests NONE Reflexed from [id number]  Final   Culture FEW STREPTOCOCCUS,BETA HEMOLYTIC NOT GROUP A  Final   Report Status 11/13/2015 FINAL  Final  Tissue culture     Status: None (Preliminary result)   Collection Time: 11/12/15  2:13 PM  Result Value Ref Range Status   Specimen Description TISSUE LYMPH NODE  Final   Special Requests RIGHT CERVICAL LYMPH NODE  Final   Gram Stain   Final    RARE WBC PRESENT, PREDOMINANTLY PMN NO ORGANISMS SEEN Performed at [facility]    Culture PENDING  Incomplete   Report Status PENDING  Incomplete    Studies/Results: US Biopsy  11/12/2015  CLINICAL DATA:  Painful right cervical lymphadenopathy and history of Lemierre syndrome. EXAM: ULTRASOUND GUIDED CORE BIOPSY OF RIGHT CERVICAL LYMPH NODE COMPARISON:  CTA of the neck on 11/09/2015 MEDICATIONS: 2.0 mg IV Versed; 75 mcg IV Fentanyl Total Moderate Sedation Time: 25 minutes. The patient's level of consciousness and physiologic status were continuously monitored during the procedure by Radiology nursing. PROCEDURE: The procedure, risks, benefits, and alternatives were explained to the patient. Questions regarding the procedure were encouraged and answered. The patient understands and consents to the procedure. A time-out was performed prior to initiating the procedure. The right neck was prepped with chlorhexidine in a sterile fashion, and a sterile drape was applied covering the operative field. A sterile gown and sterile gloves were used for the procedure. Local anesthesia was provided with 1% Lidocaine. Ultrasound was used to localize right cervical lymph nodes. Under direct ultrasound guidance, a total of five 18 gauge core biopsy samples were obtained through an enlarged lymph node and placed in saline. COMPLICATIONS: None. FINDINGS: Multiple right-sided cervical lymph nodes are visualized. The largest measures approximately 1.8 cm and was sampled. IMPRESSION: Ultrasound-guided core biopsy performed of  an enlarged right cervical lymph node. Electronically Signed   By: Aletta Edouard M.D.   On: 11/12/2015 16:19      Assessment/Plan:  INTERVAL HISTORY:   11/12/15: biopsy done 11/13/15: path Lymph node, needle/core biopsy, Right Cervical - DENSE INFLAMMATION AND NECROSIS, CONSISTENT WITH ABSCESS. - THERE IS NO EVIDENCE OF MALIGNANCY.   Principal Problem:   Sepsis ([place]) Active Problems:   Acute cervical adenitis   Neck pain on right side   Thyroid nodule   Anemia   Cervical lymphadenopathy   FUO (fever of unknown origin)   Rheumatoid factor positive    Nicole Alexander is a 40 y.o. female with  Hx several years ago of Lemierre's syndrome with tonsillitis in 2015 admitted with fevers and painful cervical LA but without abscess or clot this time. Her RA is +and she is fevering through her clindamycin still febrile. Path  suggests necrotic process  #1 Painful cervical LA: I  Have suspected  this could be an Auto immune process but she has necrosis and findings on path could be c/w necrotic vasculitis but favored to be infection. On questioning of the patient she states she has been suffering from infections involving her tonsils since the age of 46 and that a tonsillectomy was considered along the way.  I'm bothered by her persistent fevers despite the fact that she is on appropriate therapy with clindamycin.  I will therefore get a repeat CT scan to see if there is now an abscess that is developed since she was last imaged.  Followup CT results if she has an abscess would consult ENT for I and D   If dx still unclear she may need an excisional LN biopsy  Follow-up cultures  Serologies pending  Dr. Baxter Flattery to check in on the patient this weekend.   LOS: 1 day   Alcide Evener 11/13/2015, 3:57 PM

## 2015-11-13 NOTE — Progress Notes (Signed)
Tried to get patient OOB for dinner, she refused.

## 2015-11-14 DIAGNOSIS — E041 Nontoxic single thyroid nodule: Secondary | ICD-10-CM

## 2015-11-14 DIAGNOSIS — R768 Other specified abnormal immunological findings in serum: Secondary | ICD-10-CM

## 2015-11-14 DIAGNOSIS — R509 Fever, unspecified: Secondary | ICD-10-CM

## 2015-11-14 DIAGNOSIS — R590 Localized enlarged lymph nodes: Secondary | ICD-10-CM

## 2015-11-14 LAB — FUNGAL ANTIBODIES PANEL, ID-BLOOD
ASPERGILLUS FUMIGATUS IGG: NEGATIVE
Aspergillus flavus: NEGATIVE
Aspergillus niger: NEGATIVE
BLASTOMYCES ABS, QN, DID: NEGATIVE
Histoplasma Ab, Immunodiffusion: NEGATIVE

## 2015-11-14 MED ORDER — FERROUS SULFATE 325 (65 FE) MG PO TABS
325.0000 mg | ORAL_TABLET | Freq: Every day | ORAL | Status: DC
  Administered 2015-11-14 – 2015-11-15 (×2): 325 mg via ORAL
  Filled 2015-11-14 (×2): qty 1

## 2015-11-14 MED ORDER — DOCUSATE SODIUM 100 MG PO CAPS
100.0000 mg | ORAL_CAPSULE | Freq: Every day | ORAL | Status: DC
  Administered 2015-11-14: 100 mg via ORAL
  Filled 2015-11-14: qty 1

## 2015-11-14 NOTE — Progress Notes (Signed)
PROGRESS NOTE    Nicole Alexander  [id number] DOB: 27-Jan-1976 DOA: 11/09/2015 PCP: Antony Blackbird, MD    Brief Narrative:  Patient is a 40 y.o. 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 Infectious disease following. Biopsy obtained, final results are pending.   Assessment & Plan:   Acute cervical adenitis with FUO - No fever X 24 hours, patient reports taking tylenol to keep fever at bay - Non toxic appearing - Work up in progress as follows   RA factor positive, anti CCP pending.  IgM CMV negative, CMV antibody IgG positive  Toxoplasma serology negative  ANA negative ANCA titers negative ACE negative EBV IgM negative but EBV IgG positive HIV viral load negative Bartonella antibody panel negative  - ID consulted, biopsy done on 4/27, fungal culture, AFB, Quant gold sent - Continue empiric clindamycin - course unclear at the moment - She is not currently septic  Neck pain on right side - Due to above, controlled with pain medications, percocet    Thyroid nodule - F/U outpatient, incidental finding    Normocytic Anemia - Chronic for about 1 year, check CBC in AM - Iron panel on 4/25 shows iron deficiency with saturation of 7%, no ferritin checked - Check Ferritin - Start iron sulfate with colace   DVT prophylaxis: Lovenox Code Status: Full  Disposition Plan: Discuss with ID about discharge while awaiting work up    Consultants:   Infectious disease  Procedures:   LN biopsy 11/12/15  Antimicrobials:   Clindamycin 4/25 --> present   Subjective: Reports that she is doing well, just having pain from neck lymphadenopathy.  She denies dizziness, has  low appetite and normal urination and bowel movements.  She would like to go home.  She is well versed in her work up which is ongoing.  She is concerned about FMLA paperwork.  She works as a Education officer, museum for [employer]   Objective: Filed Vitals:   11/13/15 2153 11/13/15 2154 11/14/15 0601 11/14/15 0602  BP:  110/73  118/81  Pulse:  98  89  Temp: 99.8 F (37.7 C)  98.1 F (36.7 C)   TempSrc: Axillary  Oral   Resp:  18  17  Height:      Weight:      SpO2:  96%  98%    Intake/Output Summary (Last 24 hours) at 11/14/15 0724 Last data filed at 11/14/15 [id number]  Gross per 24 hour  Intake    630 ml  Output      0 ml  Net    630 ml   Filed Weights   11/09/15 1635  Weight: 247 lb (112.038 kg)    Examination:  General exam: Appears calm and comfortable  Neck: right sided cervical swelling, LAD which is tender to palpation, bandaid covering top of lymph node Respiratory system:  Respiratory effort normal. No audible wheezing Cardiovascular system: S1 & S2 heard, tachycardic. No murmurs, rubs, gallops or clicks. No pedal edema. Gastrointestinal system: Abdomen is nondistended, soft and nontender. Normal bowel sounds heard. Central nervous system: Alert and oriented. No focal neurological deficits. Skin: No rashes, lesions or ulcers Psychiatry: Judgement and insight appear normal. Mood & affect appropriate.     Data Reviewed: I have personally reviewed following labs and imaging studies  CBC:  Recent Labs Lab 11/09/15 2133 11/10/15  0351 11/11/15 0737  WBC 5.2 4.4 3.5*  NEUTROABS 2.9  --   --   HGB 10.9* 10.2* 11.1*  HCT 33.2* 30.8* 34.0*  MCV 82.2 82.6 82.5  PLT 222 177 [id number]   Basic Metabolic Panel:  Recent Labs Lab 11/09/15 2133 11/10/15 0351 11/13/15 0709  NA 137 133* 137  K 3.6 3.3* 3.7  CL 103 103 104  CO2 21* 18* 22  GLUCOSE 92 97 112*  BUN <5* <5* <5*  CREATININE 0.77 0.74 0.80  CALCIUM 8.7* 7.9* 8.8*   GFR: Estimated Creatinine Clearance: 125.9 mL/min (by  C-G formula based on Cr of 0.8). Liver Function Tests:  Recent Labs Lab 11/09/15 2133  AST 30  ALT 41  ALKPHOS 42  BILITOT 0.3  PROT 7.2  ALBUMIN 3.3*   Coagulation Profile:  Recent Labs Lab 11/12/15 1136  INR 1.11   Urine analysis:    Component Value Date/Time   COLORURINE YELLOW 11/09/2015 2236   APPEARANCEUR CLEAR 11/09/2015 2236   LABSPEC 1.008 11/09/2015 2236   PHURINE 6.5 11/09/2015 2236   GLUCOSEU NEGATIVE 11/09/2015 2236   HGBUR NEGATIVE 11/09/2015 2236   BILIRUBINUR NEGATIVE 11/09/2015 2236   KETONESUR NEGATIVE 11/09/2015 2236   PROTEINUR NEGATIVE 11/09/2015 2236   UROBILINOGEN 4.0* 08/25/2013 1228   NITRITE NEGATIVE 11/09/2015 2236   LEUKOCYTESUR SMALL* 11/09/2015 2236    Recent Results (from the past 240 hour(s))  Culture, blood (routine x 2)     Status: None (Preliminary result)   Collection Time: 11/10/15  4:05 AM  Result Value Ref Range Status   Specimen Description BLOOD RIGHT ARM  Final   Special Requests IN PEDIATRIC BOTTLE 3ML  Final   Culture NO GROWTH 3 DAYS  Final   Report Status PENDING  Incomplete  Culture, blood (routine x 2)     Status: None (Preliminary result)   Collection Time: 11/10/15  4:15 AM  Result Value Ref Range Status   Specimen Description BLOOD LEFT HAND  Final   Special Requests IN PEDIATRIC BOTTLE 3ML  Final   Culture NO GROWTH 3 DAYS  Final   Report Status PENDING  Incomplete  Rapid strep screen (not at [facility])     Status: None   Collection Time: 11/10/15 10:50 AM  Result Value Ref Range Status   Streptococcus, Group A Screen (Direct) NEGATIVE NEGATIVE Final    Comment: (NOTE) A Rapid Antigen test may result negative if the antigen level in the sample is below the detection level of this test. The FDA has not cleared this test as a stand-alone test therefore the rapid antigen negative result has reflexed to a Group A Strep culture.   Culture, group A strep     Status: None   Collection Time: 11/10/15 10:50 AM    Result Value Ref Range Status   Specimen Description THROAT  Final   Special Requests NONE Reflexed from [id number]  Final   Culture FEW STREPTOCOCCUS,BETA HEMOLYTIC NOT GROUP A  Final   Report Status 11/13/2015 FINAL  Final  Tissue culture     Status: None (Preliminary result)   Collection Time: 11/12/15  2:13 PM  Result Value Ref Range Status   Specimen Description TISSUE LYMPH NODE  Final   Special Requests RIGHT CERVICAL LYMPH NODE  Final   Gram Stain   Final    RARE WBC PRESENT, PREDOMINANTLY PMN NO ORGANISMS SEEN Performed at [facility]    Culture PENDING  Incomplete   Report Status PENDING  Incomplete  Acid Fast Smear (AFB)     Status: None   Collection Time: 11/12/15  2:13 PM  Result Value Ref Range Status   AFB Specimen Processing Comment  Final    Comment: Tissue Grinding and Digestion/Decontamination   Acid Fast Smear Negative  Final    Comment: (NOTE) Performed At: [facility] [place], [place] [id number] Lindon Romp MD [id number]    Source (AFB) TISSUE  Final    Comment: LYMPH NODE RIGHT CERVICAL LYMPH NODE          Radiology Studies: Ct Soft Tissue Neck W Contrast  11/13/2015  CLINICAL DATA:  Persistent fevers. Painful cervical lymphadenopathy. EXAM: CT NECK WITH CONTRAST TECHNIQUE: Multidetector CT imaging of the neck was performed using the standard protocol following the bolus administration of intravenous contrast. CONTRAST:  75 mL Isovue-300 COMPARISON:  Neck CTA 11/09/2015 FINDINGS: Pharynx and larynx: Symmetric bilateral palatine tonsillar enlargement which contact one another in the midline and efface the upper oropharyngeal airway. Tonsillar enlargement is stable to minimally greater than on the prior CTA. No tonsillar/peritonsillar abscess is identified. Larynx is unremarkable. Salivary glands: Submandibular and parotid glands are unremarkable. Thyroid: The left thyroid lobe nodule described on the prior CTA is  suboptimally visualized due to quantum mottle from patient body habitus. Lymph nodes: Multiple enlarged right level II lymph nodes are again seen, with the largest measuring 1.5 cm in short axis (previously 2.1 cm). Inflammatory stranding is again seen in this region and appears minimally improved. Right level V lymph nodes measure up to 8 mm in short axis (previously 10 mm). Right level III lymph nodes measure up to 8 mm in short axis, similar to prior. Left level II and upper left level III lymph nodes measure up to 9 mm in short axis and appear stable to minimally larger than on the prior study. No fluid collection/abscess is identified. Vascular: Major vascular structures of the neck appear patent, including the internal jugular veins. Limited intracranial: The visualized portion of the brain is unremarkable. Visualized orbits: Unremarkable. Mastoids and visualized paranasal sinuses: Mild paranasal sinus mucosal thickening including polypoid mucosal thickening/ mucous retention cysts in the left sphenoid and right maxillary sinuses, unchanged. No sinus fluid levels. Unchanged, small right mastoid effusion. Skeleton: No acute osseous abnormality identified. Upper chest: Visualized lung apices are clear. IMPRESSION: 1. Mildly improved right cervical lymphadenopathy and inflammatory stranding. No evidence of abscess. 2. Stable to slightly increased size of left level II/III lymph nodes. 3. Symmetric tonsillar enlargement. Electronically Signed   By: Logan Bores M.D.   On: 11/13/2015 19:47   US Biopsy  11/12/2015  CLINICAL DATA:  Painful right cervical lymphadenopathy and history of Lemierre syndrome. EXAM: ULTRASOUND GUIDED CORE BIOPSY OF RIGHT CERVICAL LYMPH NODE COMPARISON:  CTA of the neck on 11/09/2015 MEDICATIONS: 2.0 mg IV Versed; 75 mcg IV Fentanyl Total Moderate Sedation Time: 25 minutes. The patient's level of consciousness and physiologic status were continuously monitored during the procedure by  Radiology nursing. PROCEDURE: The procedure, risks, benefits, and alternatives were explained to the patient. Questions regarding the procedure were encouraged and answered. The patient understands and consents to the procedure. A time-out was performed prior to initiating the procedure. The right neck was prepped with chlorhexidine in a sterile fashion, and a sterile drape was applied covering the operative field. A sterile gown and sterile gloves were used for the procedure. Local anesthesia was provided with 1% Lidocaine. Ultrasound was used to localize right cervical lymph nodes. Under  direct ultrasound guidance, a total of five 18 gauge core biopsy samples were obtained through an enlarged lymph node and placed in saline. COMPLICATIONS: None. FINDINGS: Multiple right-sided cervical lymph nodes are visualized. The largest measures approximately 1.8 cm and was sampled. IMPRESSION: Ultrasound-guided core biopsy performed of an enlarged right cervical lymph node. Electronically Signed   By: Aletta Edouard M.D.   On: 11/12/2015 16:19        Scheduled Meds: . clindamycin (CLEOCIN) IV  600 mg Intravenous Q8H  . enoxaparin (LOVENOX) injection  40 mg Subcutaneous Q24H  . sodium chloride  1,000 mL Intravenous Once   Continuous Infusions:    LOS: 2 days    Time spent: 30    Gilles Chiquito, MD [facility] Pager [phone]  If 7PM-7AM, please contact night-coverage [email or web address] Password [facility] 11/14/2015, 7:24 AM

## 2015-11-14 NOTE — Progress Notes (Signed)
Subjective: No new complaints, less pain with cervical lymphadenopathy. No fever, chills, rigors  24hr events : afebrile x 24hr. No diarrhea. Repeat CT did not show any abscess  Antibiotics:  Anti-infectives    Start     Dose/Rate Route Frequency Ordered Stop   11/10/15 0400  clindamycin (CLEOCIN) IVPB 600 mg     600 mg 100 mL/hr over 30 Minutes Intravenous Every 8 hours 11/10/15 0320     11/10/15 0200  clindamycin (CLEOCIN) IVPB 600 mg     600 mg 100 mL/hr over 30 Minutes Intravenous  Once 11/10/15 0147 11/10/15 0311      Medications: Scheduled Meds: . clindamycin (CLEOCIN) IV  600 mg Intravenous Q8H  . docusate sodium  100 mg Oral Daily  . enoxaparin (LOVENOX) injection  40 mg Subcutaneous Q24H  . ferrous sulfate  325 mg Oral Q breakfast  . sodium chloride  1,000 mL Intravenous Once    Objective: Weight change:   Intake/Output Summary (Last 24 hours) at 11/14/15 1557 Last data filed at 11/14/15 0603  Gross per 24 hour  Intake    150 ml  Output      0 ml  Net    150 ml   Blood pressure 117/80, pulse 90, temperature 98.2 F (36.8 C), temperature source Oral, resp. rate 17, height 5\' 9"  (1.753 m), weight 247 lb (112.038 kg), last menstrual period 10/23/2015, SpO2 98 %. Temp:  [98.1 F (36.7 C)-99.8 F (37.7 C)] 98.2 F (36.8 C) (04/29 1516) Pulse Rate:  [89-98] 90 (04/29 1516) Resp:  [17-18] 17 (04/29 1516) BP: (110-118)/(73-81) 117/80 mmHg (04/29 1516) SpO2:  [96 %-98 %] 98 % (04/29 1516)  Physical Exam: General: Alert and awake, oriented x3, not in any acute distress. HEENT: anicteric sclera,EOMI Neck : bulky cervical lymphadenopathy slightly tender CVS regular rate, normal r,  no murmur rubs or gallops Chest: clear to auscultation bilaterally, no wheezing, rales or rhonchi Abdomen: soft nontender, nondistended, normal bowel sounds, Skin: no rashes Neuro: nonfocal  CBC:  CBC Latest Ref Rng 11/11/2015 11/10/2015 11/09/2015  WBC 4.0 - 10.5 K/uL  3.5(L) 4.4 5.2  Hemoglobin 12.0 - 15.0 g/dL 11.1(L) 10.2(L) 10.9(L)  Hematocrit 36.0 - 46.0 % 34.0(L) 30.8(L) 33.2(L)  Platelets 150 - 400 K/uL 251 177 222   BMET  Recent Labs  11/13/15 0709  NA 137  K 3.7  CL 104  CO2 22  GLUCOSE 112*  BUN <5*  CREATININE 0.80  CALCIUM 8.8*   Micro Results: Recent Results (from the past 720 hour(s))  Culture, blood (routine x 2)     Status: None (Preliminary result)   Collection Time: 11/10/15  4:05 AM  Result Value Ref Range Status   Specimen Description BLOOD RIGHT ARM  Final   Special Requests IN PEDIATRIC BOTTLE 3ML  Final   Culture NO GROWTH 4 DAYS  Final   Report Status PENDING  Incomplete  Culture, blood (routine x 2)     Status: None (Preliminary result)   Collection Time: 11/10/15  4:15 AM  Result Value Ref Range Status   Specimen Description BLOOD LEFT HAND  Final   Special Requests IN PEDIATRIC BOTTLE 3ML  Final   Culture NO GROWTH 4 DAYS  Final   Report Status PENDING  Incomplete  Rapid strep screen (not at [facility])     Status: None   Collection Time: 11/10/15 10:50 AM  Result Value Ref Range Status   Streptococcus, Group A Screen (Direct) NEGATIVE  NEGATIVE Final    Comment: (NOTE) A Rapid Antigen test may result negative if the antigen level in the sample is below the detection level of this test. The FDA has not cleared this test as a stand-alone test therefore the rapid antigen negative result has reflexed to a Group A Strep culture.   Culture, group A strep     Status: None   Collection Time: 11/10/15 10:50 AM  Result Value Ref Range Status   Specimen Description THROAT  Final   Special Requests NONE Reflexed from [id number]  Final   Culture FEW STREPTOCOCCUS,BETA HEMOLYTIC NOT GROUP A  Final   Report Status 11/13/2015 FINAL  Final  Tissue culture     Status: None (Preliminary result)   Collection Time: 11/12/15  2:13 PM  Result Value Ref Range Status   Specimen Description TISSUE LYMPH NODE  Final   Special Requests  RIGHT CERVICAL LYMPH NODE  Final   Gram Stain   Final    RARE WBC PRESENT, PREDOMINANTLY PMN NO ORGANISMS SEEN Performed at [facility]    Culture   Final    NO GROWTH 1 DAY Performed at [facility]    Report Status PENDING  Incomplete  Acid Fast Smear (AFB)     Status: None   Collection Time: 11/12/15  2:13 PM  Result Value Ref Range Status   AFB Specimen Processing Comment  Final    Comment: Tissue Grinding and Digestion/Decontamination   Acid Fast Smear Negative  Final    Comment: (NOTE) Performed At: [facility] [place], [place] [id number] Lindon Romp MD [id number]    Source (AFB) TISSUE  Final    Comment: LYMPH NODE RIGHT CERVICAL LYMPH NODE     Studies/Results: Ct Soft Tissue Neck W Contrast  11/13/2015  CLINICAL DATA:  Persistent fevers. Painful cervical lymphadenopathy. EXAM: CT NECK WITH CONTRAST TECHNIQUE: Multidetector CT imaging of the neck was performed using the standard protocol following the bolus administration of intravenous contrast. CONTRAST:  75 mL Isovue-300 COMPARISON:  Neck CTA 11/09/2015 FINDINGS: Pharynx and larynx: Symmetric bilateral palatine tonsillar enlargement which contact one another in the midline and efface the upper oropharyngeal airway. Tonsillar enlargement is stable to minimally greater than on the prior CTA. No tonsillar/peritonsillar abscess is identified. Larynx is unremarkable. Salivary glands: Submandibular and parotid glands are unremarkable. Thyroid: The left thyroid lobe nodule described on the prior CTA is suboptimally visualized due to quantum mottle from patient body habitus. Lymph nodes: Multiple enlarged right level II lymph nodes are again seen, with the largest measuring 1.5 cm in short axis (previously 2.1 cm). Inflammatory stranding is again seen in this region and appears minimally improved. Right level V lymph nodes measure up to 8 mm in short axis (previously 10 mm). Right level  III lymph nodes measure up to 8 mm in short axis, similar to prior. Left level II and upper left level III lymph nodes measure up to 9 mm in short axis and appear stable to minimally larger than on the prior study. No fluid collection/abscess is identified. Vascular: Major vascular structures of the neck appear patent, including the internal jugular veins. Limited intracranial: The visualized portion of the brain is unremarkable. Visualized orbits: Unremarkable. Mastoids and visualized paranasal sinuses: Mild paranasal sinus mucosal thickening including polypoid mucosal thickening/ mucous retention cysts in the left sphenoid and right maxillary sinuses, unchanged. No sinus fluid levels. Unchanged, small right mastoid effusion. Skeleton: No acute osseous abnormality identified. Upper chest:  Visualized lung apices are clear. IMPRESSION: 1. Mildly improved right cervical lymphadenopathy and inflammatory stranding. No evidence of abscess. 2. Stable to slightly increased size of left level II/III lymph nodes. 3. Symmetric tonsillar enlargement. Electronically Signed   By: Logan Bores M.D.   On: 11/13/2015 19:47      Assessment/Plan:  INTERVAL HISTORY:   11/12/15: biopsy done 11/13/15: path Lymph node, needle/core biopsy, Right Cervical - DENSE INFLAMMATION AND NECROSIS, CONSISTENT WITH ABSCESS. - THERE IS NO EVIDENCE OF MALIGNANCY.   Principal Problem:   Sepsis ([place]) Active Problems:   Acute cervical adenitis   Neck pain on right side   Thyroid nodule   Anemia   Cervical lymphadenopathy   FUO (fever of unknown origin)   Rheumatoid factor positive    Nicole Alexander is a 40 y.o. female with  Hx several years ago of Lemierre's syndrome with tonsillitis in 2015 admitted with fevers and painful cervical LA but without abscess or clot this time. . Path suggests necrotic process. apears to be responding to clindamycin  #1 Painful cervical LA: I  Have suspected  this could be an Auto immune  process but she has necrosis and findings on path could be c/w infection. Will treat with clindamycin for total of 10 days. Will have her follow up with Dr. Tommy Medal at [facility] for further management.   LOS: 2 days   Carlyle Basques 11/14/2015, 3:57 PM

## 2015-11-15 DIAGNOSIS — M542 Cervicalgia: Secondary | ICD-10-CM

## 2015-11-15 DIAGNOSIS — D649 Anemia, unspecified: Secondary | ICD-10-CM

## 2015-11-15 LAB — CULTURE, BLOOD (ROUTINE X 2)
CULTURE: NO GROWTH
CULTURE: NO GROWTH

## 2015-11-15 LAB — CBC
HEMATOCRIT: 35.1 % — AB (ref 36.0–46.0)
HEMOGLOBIN: 11.7 g/dL — AB (ref 12.0–15.0)
MCH: 27.6 pg (ref 26.0–34.0)
MCHC: 33.3 g/dL (ref 30.0–36.0)
MCV: 82.8 fL (ref 78.0–100.0)
PLATELETS: 283 10*3/uL (ref 150–400)
RBC: 4.24 MIL/uL (ref 3.87–5.11)
RDW: 14.3 % (ref 11.5–15.5)
WBC: 3.3 10*3/uL — ABNORMAL LOW (ref 4.0–10.5)

## 2015-11-15 LAB — BASIC METABOLIC PANEL
Anion gap: 9 (ref 5–15)
CALCIUM: 8.6 mg/dL — AB (ref 8.9–10.3)
CO2: 22 mmol/L (ref 22–32)
CREATININE: 0.69 mg/dL (ref 0.44–1.00)
Chloride: 106 mmol/L (ref 101–111)
GFR calc Af Amer: >60 mL/min (ref >60)
GLUCOSE: 105 mg/dL — AB (ref 65–99)
POTASSIUM: 3.8 mmol/L (ref 3.5–5.1)
Sodium: 137 mmol/L (ref 135–145)

## 2015-11-15 LAB — TISSUE CULTURE: CULTURE: NO GROWTH

## 2015-11-15 LAB — FERRITIN: Ferritin: 267 ng/mL (ref 11–307)

## 2015-11-15 LAB — CYCLIC CITRUL PEPTIDE ANTIBODY, IGG/IGA: CCP ANTIBODIES IGG/IGA: 5 U (ref 0–19)

## 2015-11-15 MED ORDER — CLINDAMYCIN HCL 300 MG PO CAPS: 600.0000 mg | ORAL_CAPSULE | Freq: Three times a day (TID) | ORAL | Status: DC

## 2015-11-15 MED ORDER — DOCUSATE SODIUM 100 MG PO CAPS
100.0000 mg | ORAL_CAPSULE | Freq: Every day | ORAL | Status: DC
Start: 2015-11-15 — End: 2018-03-27

## 2015-11-15 MED ORDER — CLINDAMYCIN HCL 300 MG PO CAPS
600.0000 mg | ORAL_CAPSULE | Freq: Three times a day (TID) | ORAL | Status: DC
  Administered 2015-11-15: 600 mg via ORAL
  Filled 2015-11-15: qty 2

## 2015-11-15 MED ORDER — FERROUS SULFATE 325 (65 FE) MG PO TABS: 325.0000 mg | ORAL_TABLET | Freq: Every day | ORAL | Status: DC

## 2015-11-15 MED ORDER — OXYCODONE-ACETAMINOPHEN 5-325 MG PO TABS: 1.0000 | ORAL_TABLET | ORAL | Status: DC | PRN

## 2015-11-15 NOTE — Progress Notes (Signed)
PROGRESS NOTE    Nicole Alexander  [id number] DOB: 12-Jan-1976 DOA: 11/09/2015 PCP: Antony Blackbird, MD    Brief Narrative:  Patient is a 41 y.o. 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 Infectious disease following. Biopsy obtained, final results are pending.   Assessment & Plan:   Acute cervical adenitis with FUO - No fever X 48 hours, patient reports taking tylenol to keep fever at bay - Non toxic appearing - Work up in progress as follows   RA factor positive, anti CCP normal.  IgM CMV negative, CMV antibody IgG positive  Toxoplasma serology negative  ANA negative ANCA titers negative ACE negative EBV IgM negative but EBV IgG positive HIV viral load negative Bartonella antibody panel negative  - ID consulted, biopsy done on 4/27, fungal culture, AFB, Quant gold sent - Continue empiric clindamycin for 10 day course  Neck pain on right side - Due to above, controlled with pain medications, percocet    Thyroid nodule - F/U outpatient, incidental finding    Normocytic Anemia - Chronic for about 1 year, check CBC in AM - Iron panel on 4/25 shows iron deficiency with saturation of 7%, no ferritin checked - Check Ferritin pending - Started iron sulfate with colace   DVT prophylaxis: Lovenox Code Status: Full  Disposition Plan: Discharge today    Consultants:   Infectious disease  Procedures:   LN biopsy 11/12/15  Antimicrobials:   Clindamycin 4/25 --> present for 10 day course   Subjective: Doing well, excited to go home.  Has some pain at LAD in the neck.   Objective: Filed Vitals:   11/14/15 0602 11/14/15 1516 11/14/15 2335 11/15/15 0524   BP: 118/81 117/80 107/73 115/81  Pulse: 89 90 80 85  Temp:  98.2 F (36.8 C) 98 F (36.7 C) 98 F (36.7 C)  TempSrc:   Oral Oral  Resp: 17 17 18 16   Height:      Weight:      SpO2: 98% 98% 93% 97%    Intake/Output Summary (Last 24 hours) at 11/15/15 0752 Last data filed at 11/15/15 0700  Gross per 24 hour  Intake    460 ml  Output      0 ml  Net    460 ml   Filed Weights   11/09/15 1635  Weight: 247 lb (112.038 kg)    Examination:  General exam: Appears calm and comfortable  Neck: right sided cervical swelling, LAD which is tender to palpation, bandaid covering top of lymph node Respiratory system:  Respiratory effort normal. No audible wheezing Cardiovascular system: S1 & S2 heard, RR, NR. No murmurs Gastrointestinal system: Abdomen is nondistended, soft and nontender Central nervous system: Alert and oriented. No focal neurological deficits. Skin: No rashes, lesions or ulcers Psychiatry: Judgement and insight appear normal. Mood & affect appropriate.     Data Reviewed: I have personally reviewed following labs and imaging studies  CBC:  Recent Labs Lab 11/09/15 2133 11/10/15 0351 11/11/15 0737  WBC 5.2 4.4 3.5*  NEUTROABS 2.9  --   --   HGB 10.9* 10.2* 11.1*  HCT 33.2* 30.8* 34.0*  MCV 82.2 82.6 82.5  PLT 222 177 [id number]   Basic Metabolic Panel:  Recent Labs Lab 11/09/15 2133 11/10/15 0351 11/13/15 0709  NA 137 133* 137  K 3.6 3.3* 3.7  CL 103 103 104  CO2 21* 18* 22  GLUCOSE 92 97 112*  BUN <5* <5* <5*  CREATININE 0.77 0.74 0.80  CALCIUM 8.7* 7.9* 8.8*   GFR: Estimated Creatinine Clearance: 125.9 mL/min (by C-G formula based on Cr of 0.8). Liver Function Tests:  Recent Labs Lab 11/09/15 2133  AST 30  ALT 41  ALKPHOS 42  BILITOT 0.3  PROT 7.2  ALBUMIN 3.3*   Coagulation Profile:  Recent Labs Lab 11/12/15 1136  INR 1.11   Urine analysis:    Component Value Date/Time   COLORURINE YELLOW 11/09/2015 2236   APPEARANCEUR CLEAR  11/09/2015 2236   LABSPEC 1.008 11/09/2015 2236   PHURINE 6.5 11/09/2015 2236   GLUCOSEU NEGATIVE 11/09/2015 2236   HGBUR NEGATIVE 11/09/2015 2236   BILIRUBINUR NEGATIVE 11/09/2015 2236   KETONESUR NEGATIVE 11/09/2015 2236   PROTEINUR NEGATIVE 11/09/2015 2236   UROBILINOGEN 4.0* 08/25/2013 1228   NITRITE NEGATIVE 11/09/2015 2236   LEUKOCYTESUR SMALL* 11/09/2015 2236    Recent Results (from the past 240 hour(s))  Culture, blood (routine x 2)     Status: None (Preliminary result)   Collection Time: 11/10/15  4:05 AM  Result Value Ref Range Status   Specimen Description BLOOD RIGHT ARM  Final   Special Requests IN PEDIATRIC BOTTLE 3ML  Final   Culture NO GROWTH 4 DAYS  Final   Report Status PENDING  Incomplete  Culture, blood (routine x 2)     Status: None (Preliminary result)   Collection Time: 11/10/15  4:15 AM  Result Value Ref Range Status   Specimen Description BLOOD LEFT HAND  Final   Special Requests IN PEDIATRIC BOTTLE 3ML  Final   Culture NO GROWTH 4 DAYS  Final   Report Status PENDING  Incomplete  Rapid strep screen (not at [facility])     Status: None   Collection Time: 11/10/15 10:50 AM  Result Value Ref Range Status   Streptococcus, Group A Screen (Direct) NEGATIVE NEGATIVE Final    Comment: (NOTE) A Rapid Antigen test may result negative if the antigen level in the sample is below the detection level of this test. The FDA has not cleared this test as a stand-alone test therefore the rapid antigen negative result has reflexed to a Group A Strep culture.   Culture, group A strep     Status: None   Collection Time: 11/10/15 10:50 AM  Result Value Ref Range Status   Specimen Description THROAT  Final   Special Requests NONE Reflexed from [id number]  Final   Culture FEW STREPTOCOCCUS,BETA HEMOLYTIC NOT GROUP A  Final   Report Status 11/13/2015 FINAL  Final  Tissue culture     Status: None   Collection Time: 11/12/15  2:13 PM  Result Value Ref Range Status   Specimen  Description TISSUE LYMPH NODE  Final   Special Requests RIGHT CERVICAL LYMPH NODE  Final   Gram Stain   Final    RARE WBC PRESENT, PREDOMINANTLY PMN NO ORGANISMS SEEN Performed at [facility]    Culture   Final    NO GROWTH 2 DAYS Performed at [facility]    Report Status 11/15/2015 FINAL  Final  Acid Fast Smear (AFB)     Status: None   Collection Time: 11/12/15  2:13 PM  Result Value Ref Range Status   AFB Specimen Processing Comment  Final    Comment: Tissue Grinding and Digestion/Decontamination   Acid Fast Smear Negative  Final    Comment: (  NOTE) Performed At: [facility] [place], [place] [id number] Lindon Romp MD [id number]    Source (AFB) TISSUE  Final    Comment: LYMPH NODE RIGHT CERVICAL LYMPH NODE          Radiology Studies: Ct Soft Tissue Neck W Contrast  11/13/2015  CLINICAL DATA:  Persistent fevers. Painful cervical lymphadenopathy. EXAM: CT NECK WITH CONTRAST TECHNIQUE: Multidetector CT imaging of the neck was performed using the standard protocol following the bolus administration of intravenous contrast. CONTRAST:  75 mL Isovue-300 COMPARISON:  Neck CTA 11/09/2015 FINDINGS: Pharynx and larynx: Symmetric bilateral palatine tonsillar enlargement which contact one another in the midline and efface the upper oropharyngeal airway. Tonsillar enlargement is stable to minimally greater than on the prior CTA. No tonsillar/peritonsillar abscess is identified. Larynx is unremarkable. Salivary glands: Submandibular and parotid glands are unremarkable. Thyroid: The left thyroid lobe nodule described on the prior CTA is suboptimally visualized due to quantum mottle from patient body habitus. Lymph nodes: Multiple enlarged right level II lymph nodes are again seen, with the largest measuring 1.5 cm in short axis (previously 2.1 cm). Inflammatory stranding is again seen in this region and appears minimally improved. Right level V  lymph nodes measure up to 8 mm in short axis (previously 10 mm). Right level III lymph nodes measure up to 8 mm in short axis, similar to prior. Left level II and upper left level III lymph nodes measure up to 9 mm in short axis and appear stable to minimally larger than on the prior study. No fluid collection/abscess is identified. Vascular: Major vascular structures of the neck appear patent, including the internal jugular veins. Limited intracranial: The visualized portion of the brain is unremarkable. Visualized orbits: Unremarkable. Mastoids and visualized paranasal sinuses: Mild paranasal sinus mucosal thickening including polypoid mucosal thickening/ mucous retention cysts in the left sphenoid and right maxillary sinuses, unchanged. No sinus fluid levels. Unchanged, small right mastoid effusion. Skeleton: No acute osseous abnormality identified. Upper chest: Visualized lung apices are clear. IMPRESSION: 1. Mildly improved right cervical lymphadenopathy and inflammatory stranding. No evidence of abscess. 2. Stable to slightly increased size of left level II/III lymph nodes. 3. Symmetric tonsillar enlargement. Electronically Signed   By: Logan Bores M.D.   On: 11/13/2015 19:47        Scheduled Meds: . clindamycin  600 mg Oral Q8H  . docusate sodium  100 mg Oral Daily  . enoxaparin (LOVENOX) injection  40 mg Subcutaneous Q24H  . ferrous sulfate  325 mg Oral Q breakfast  . sodium chloride  1,000 mL Intravenous Once   Continuous Infusions:    LOS: 3 days    Time spent: 30    Gilles Chiquito, MD [facility] Pager [phone]  If 7PM-7AM, please contact night-coverage [email or web address] Password [email or web address] 11/15/2015, 7:52 AM

## 2015-11-15 NOTE — Progress Notes (Signed)
NURSING PROGRESS NOTE  Nicole Alexander [id number] Discharge Data: 11/15/2015 10:19 AM Attending Provider: Sid Falcon, MD [id number], CAMMIE, MD     [name] to be D/C'd Home per MD order.  Discussed with the patient the After Visit Summary and all questions fully answered. All IV's discontinued with no bleeding noted. All belongings returned to patient for patient to take home. Pt given discharge instructions on iron deficiency anemia, ferrous tablets, colace, oxycodone, & cleocin. Pt taken downstairs via wheelchair accompanied by a staff member.  Last Vital Signs:  Blood pressure 115/81, pulse 85, temperature 98 F (36.7 C), temperature source Oral, resp. rate 16, height 5\' 9"  (1.753 m), weight 112.038 kg (247 lb), last menstrual period 10/23/2015, SpO2 97 %.  Discharge Medication List   Medication List    TAKE these medications        clindamycin 300 MG capsule  Commonly known as:  CLEOCIN  Take 2 capsules (600 mg total) by mouth every 8 (eight) hours.     docusate sodium 100 MG capsule  Commonly known as:  COLACE  Take 1 capsule (100 mg total) by mouth daily.     ferrous sulfate 325 (65 FE) MG tablet  Take 1 tablet (325 mg total) by mouth daily with breakfast.     oxyCODONE-acetaminophen 5-325 MG tablet  Commonly known as:  PERCOCET/ROXICET  Take 1 tablet by mouth every 4 (four) hours as needed for moderate pain or severe pain.

## 2015-11-15 NOTE — Discharge Summary (Signed)
Physician Discharge Summary  Nicole Alexander [id number] DOB: Feb 01, 1976 DOA: 11/09/2015  PCP: Nicole Blackbird, MD  Admit date: 11/09/2015 Discharge date: 11/15/2015  Recommendations for Outpatient Follow-up:  1. Pt will need to follow up with PCP in 2-3 weeks post discharge 2. Please obtain BMP to evaluate electrolytes and kidney function 3. Please also check CBC to evaluate Hg and Hct levels 4. Follow up with ID  Discharge Diagnoses:     Acute cervical adenitis   Neck pain on right side   Thyroid nodule   Anemia   Cervical lymphadenopathy   FUO (fever of unknown origin)   Rheumatoid factor positive  Discharge Condition: Stable  Diet recommendation: Regular  History of present illness:  Nicole Alexander is a 40 y.o. female with medical history significant of Lemierre syndrome; who presents with complaints of fever and right-sided neck swelling. Symptoms started 1 week ago with fever and right-sided neck swelling. She reports fever got as high as 101.77F at home. She reports a sharp pain on the right side of her neck that radiated to her ear. She was seen at urgent care 6 days ago. At that time she was checked for strep throat told this was negative and was sent home with a Z-Pak. She notes having a waxing and waning fever taking Aleve to help treat symptoms. Associated symptoms included a nonproductive cough. She completed the antibiotics 2 days ago, but noted persistent fever and continued pain and swelling of the right side of her neck. She went back to her primary care provider's office yesterday and was directed to the emergency department for further evaluation. Patient notes that back in 2015 she was diagnosed with Lemierre syndrome. Patient denies having any headache, chest pain, rash, shortness of breath, wheezing, or abnormal bleeding.   Hospital Course:   Acute cervical adenitis with fevers  Ms. Sobon initially appeared septic with tachycardia, fever,  tachypnea.  She was placed on IV clindamycin and a CT of her neck was done on 4/25 which revealed extensive right cervical adenopathy and a thyroid nodule.  She was seen by ID on 4/26, this team requested a biopsy of the lymph node.  Work up included RF which was positive, anti CCP normal,IgM CMV negative, CMV antibody IgG positive, Toxoplasma serology negative, ANA negative, ANCA titers negative, ACE negative, EBV IgM negative but EBV IgG positive, HIV viral load negative, Bartonella antibody panel negative.  Her biopsy was sent for fungal, AFB and bacterial cultures.  Repeat imaging of the neck on 4/28 improving adenopathy without abscess.  She progressively improved and her neck pain, though still present, improved.  She was sent home to await cultures from her biopsy and to complete a 10 day course of clindamycin.  She had been afebrile X 48 hours on discharge.   Thyroid nodule TSH WNL.  Follow up with ultrasound outpatient    Anemia Appeared chronic, low iron on check.  Iron sulfate started, will need outpatient follow up.   Rheumatoid factor positive CCP was normal.  She did not have any joint problems.  Possibly an acute phase reactant.   Occasional hypokalemia Repleted during her hospital stay.     Procedures/Studies: Dg Chest 2 View  11/09/2015  CLINICAL DATA:  Fever for 6 days EXAM: CHEST  2 VIEW COMPARISON:  09/10/2013 FINDINGS: Normal heart size. Lungs clear. No pneumothorax. No pleural effusion. IMPRESSION: No active cardiopulmonary disease. Electronically Signed   By: Marybelle Killings M.D.   On: 11/09/2015 18:54  Ct Soft Tissue Neck W Contrast  11/13/2015  CLINICAL DATA:  Persistent fevers. Painful cervical lymphadenopathy. EXAM: CT NECK WITH CONTRAST TECHNIQUE: Multidetector CT imaging of the neck was performed using the standard protocol following the bolus administration of intravenous contrast. CONTRAST:  75 mL Isovue-300 COMPARISON:  Neck CTA 11/09/2015 FINDINGS: Pharynx and  larynx: Symmetric bilateral palatine tonsillar enlargement which contact one another in the midline and efface the upper oropharyngeal airway. Tonsillar enlargement is stable to minimally greater than on the prior CTA. No tonsillar/peritonsillar abscess is identified. Larynx is unremarkable. Salivary glands: Submandibular and parotid glands are unremarkable. Thyroid: The left thyroid lobe nodule described on the prior CTA is suboptimally visualized due to quantum mottle from patient body habitus. Lymph nodes: Multiple enlarged right level II lymph nodes are again seen, with the largest measuring 1.5 cm in short axis (previously 2.1 cm). Inflammatory stranding is again seen in this region and appears minimally improved. Right level V lymph nodes measure up to 8 mm in short axis (previously 10 mm). Right level III lymph nodes measure up to 8 mm in short axis, similar to prior. Left level II and upper left level III lymph nodes measure up to 9 mm in short axis and appear stable to minimally larger than on the prior study. No fluid collection/abscess is identified. Vascular: Major vascular structures of the neck appear patent, including the internal jugular veins. Limited intracranial: The visualized portion of the brain is unremarkable. Visualized orbits: Unremarkable. Mastoids and visualized paranasal sinuses: Mild paranasal sinus mucosal thickening including polypoid mucosal thickening/ mucous retention cysts in the left sphenoid and right maxillary sinuses, unchanged. No sinus fluid levels. Unchanged, small right mastoid effusion. Skeleton: No acute osseous abnormality identified. Upper chest: Visualized lung apices are clear. IMPRESSION: 1. Mildly improved right cervical lymphadenopathy and inflammatory stranding. No evidence of abscess. 2. Stable to slightly increased size of left level II/III lymph nodes. 3. Symmetric tonsillar enlargement. Electronically Signed   By: Logan Bores M.D.   On: 11/13/2015 19:47    Ct Angio Neck W/cm &/or Wo/cm  11/10/2015  CLINICAL DATA:  Initial valuation for fever with right-sided neck swelling. EXAM: CT ANGIOGRAPHY NECK TECHNIQUE: Multidetector CT imaging of the neck was performed using the standard protocol during bolus administration of intravenous contrast. Multiplanar CT image reconstructions and MIPs were obtained to evaluate the vascular anatomy. Carotid stenosis measurements (when applicable) are obtained utilizing NASCET criteria, using the distal internal carotid diameter as the denominator. CONTRAST:  35mL ISOVUE-300 IOPAMIDOL (ISOVUE-300) INJECTION 61% COMPARISON:  Prior study from 08/27/2013. FINDINGS: Aortic arch: Visualized aortic arch of normal caliber with normal branch pattern. No high-grade stenosis at the origin of the great vessels. Subclavian arteries patent bilaterally. Right carotid system: Right common carotid artery patent from its origin to the bifurcation. Right ICA widely patent from the bifurcation to the skullbase. No stenosis, dissection, or vascular occlusion. Left carotid system: Left common carotid artery patent from its origin to the bifurcation. Left ICA widely patent from the bifurcation to the skullbase. No dissection, stenosis, or vascular occlusion. Vertebral arteries:Both vertebral arteries arise from the subclavian arteries. Vertebral arteries widely patent without stenosis, dissection, or occlusion. Skeleton: No acute osseous abnormality. No worrisome lytic or blastic osseous lesions. Other neck: Visualized lungs are clear. Visualized superior mediastinum demonstrates no acute abnormality. Approximately 15 mm hypodense nodule within the left lobe of thyroid, indeterminate. Enlarged right level 2a lymph node measures approximately 2 cm in short axis. Multiple additional right level 2B nodes present  more posteriorly within the right neck measuring up to 12 mm. Associated inflammatory stranding within this region. Few additional right level 2/3  nodes present more inferiorly measuring up to 8 mm. Few level 5 B nodes also present, measuring up to 1 cm. Right-sided supraclavicular nodes measure up to 8 mm. Findings may be reactive in nature. Suppurative adenitis could have this appearance given the inflammatory stranding. The internal jugular veins not well evaluated on this exam due to timing of the contrast bolus, but are grossly patent. No significant left-sided adenopathy. Probable retention cyst noted within the left sphenoid sinus. IMPRESSION: 1. Normal CTA of the neck. 2. Extensive bulky right-sided cervical adenopathy with associated inflammatory stranding, consistent with suppurative adenitis/reactive nodal disease. Clinical followup to resolution recommended. No jugular vein thrombosis identified on this exam, although evaluation somewhat limited by timing of the contrast bolus. 3. Approximate 15 mm heterogeneous left thyroid nodule, indeterminate. Further evaluation with dedicated thyroid ultrasound recommended. This could be performed on a nonemergent basis. Electronically Signed   By: Jeannine Boga M.D.   On: 11/10/2015 01:35   US Biopsy  11/12/2015  CLINICAL DATA:  Painful right cervical lymphadenopathy and history of Lemierre syndrome. EXAM: ULTRASOUND GUIDED CORE BIOPSY OF RIGHT CERVICAL LYMPH NODE COMPARISON:  CTA of the neck on 11/09/2015 MEDICATIONS: 2.0 mg IV Versed; 75 mcg IV Fentanyl Total Moderate Sedation Time: 25 minutes. The patient's level of consciousness and physiologic status were continuously monitored during the procedure by Radiology nursing. PROCEDURE: The procedure, risks, benefits, and alternatives were explained to the patient. Questions regarding the procedure were encouraged and answered. The patient understands and consents to the procedure. A time-out was performed prior to initiating the procedure. The right neck was prepped with chlorhexidine in a sterile fashion, and a sterile drape was applied covering  the operative field. A sterile gown and sterile gloves were used for the procedure. Local anesthesia was provided with 1% Lidocaine. Ultrasound was used to localize right cervical lymph nodes. Under direct ultrasound guidance, a total of five 18 gauge core biopsy samples were obtained through an enlarged lymph node and placed in saline. COMPLICATIONS: None. FINDINGS: Multiple right-sided cervical lymph nodes are visualized. The largest measures approximately 1.8 cm and was sampled. IMPRESSION: Ultrasound-guided core biopsy performed of an enlarged right cervical lymph node. Electronically Signed   By: Aletta Edouard M.D.   On: 11/12/2015 16:19      Consultations:  ID  Interventional radiology  Antibiotics:  Clindamycin 4/25 --> 5/4  Discharge Exam: Filed Vitals:   11/14/15 2335 11/15/15 0524  BP: 107/73 115/81  Pulse: 80 85  Temp: 98 F (36.7 C) 98 F (36.7 C)  Resp: 18 16   Filed Vitals:   11/14/15 0602 11/14/15 1516 11/14/15 2335 11/15/15 0524  BP: 118/81 117/80 107/73 115/81  Pulse: 89 90 80 85  Temp:  98.2 F (36.8 C) 98 F (36.7 C) 98 F (36.7 C)  TempSrc:   Oral Oral  Resp: 17 17 18 16   Height:      Weight:      SpO2: 98% 98% 93% 97%    General exam: Appears calm and comfortable  Neck: right sided cervical swelling, LAD which is tender to palpation, bandaid covering top of lymph node Respiratory system: Respiratory effort normal. No audible wheezing Cardiovascular system: S1 & S2 heard, RR, NR. No murmurs Gastrointestinal system: Abdomen is nondistended, soft and nontender Central nervous system: Alert and oriented. No focal neurological deficits. Skin: No rashes, lesions or  ulcers Psychiatry: Judgement and insight appear normal. Mood & affect appropriate.   Discharge Instructions     Medication List    TAKE these medications        clindamycin 300 MG capsule  Commonly known as:  CLEOCIN  Take 2 capsules (600 mg total) by mouth every 8 (eight)  hours.     docusate sodium 100 MG capsule  Commonly known as:  COLACE  Take 1 capsule (100 mg total) by mouth daily.     ferrous sulfate 325 (65 FE) MG tablet  Take 1 tablet (325 mg total) by mouth daily with breakfast.     oxyCODONE-acetaminophen 5-325 MG tablet  Commonly known as:  PERCOCET/ROXICET  Take 1 tablet by mouth every 4 (four) hours as needed for moderate pain or severe pain.           Follow-up Information    Follow up with Alcide Evener, MD. Schedule an appointment as soon as possible for a visit in 1 week.   Specialty:  Infectious Diseases   Contact information:   [address] [place] [address] [phone]        The results of significant diagnostics from this hospitalization (including imaging, microbiology, ancillary and laboratory) are listed below for reference.     Microbiology: Recent Results (from the past 240 hour(s))  Culture, blood (routine x 2)     Status: None (Preliminary result)   Collection Time: 11/10/15  4:05 AM  Result Value Ref Range Status   Specimen Description BLOOD RIGHT ARM  Final   Special Requests IN PEDIATRIC BOTTLE 3ML  Final   Culture NO GROWTH 4 DAYS  Final   Report Status PENDING  Incomplete  Culture, blood (routine x 2)     Status: None (Preliminary result)   Collection Time: 11/10/15  4:15 AM  Result Value Ref Range Status   Specimen Description BLOOD LEFT HAND  Final   Special Requests IN PEDIATRIC BOTTLE 3ML  Final   Culture NO GROWTH 4 DAYS  Final   Report Status PENDING  Incomplete  Rapid strep screen (not at [facility])     Status: None   Collection Time: 11/10/15 10:50 AM  Result Value Ref Range Status   Streptococcus, Group A Screen (Direct) NEGATIVE NEGATIVE Final    Comment: (NOTE) A Rapid Antigen test may result negative if the antigen level in the sample is below the detection level of this test. The FDA has not cleared this test as a stand-alone test therefore the rapid  antigen negative result has reflexed to a Group A Strep culture.   Culture, group A strep     Status: None   Collection Time: 11/10/15 10:50 AM  Result Value Ref Range Status   Specimen Description THROAT  Final   Special Requests NONE Reflexed from [id number]  Final   Culture FEW STREPTOCOCCUS,BETA HEMOLYTIC NOT GROUP A  Final   Report Status 11/13/2015 FINAL  Final  Tissue culture     Status: None   Collection Time: 11/12/15  2:13 PM  Result Value Ref Range Status   Specimen Description TISSUE LYMPH NODE  Final   Special Requests RIGHT CERVICAL LYMPH NODE  Final   Gram Stain   Final    RARE WBC PRESENT, PREDOMINANTLY PMN NO ORGANISMS SEEN Performed at [facility]    Culture   Final    NO GROWTH 2 DAYS Performed at [facility]    Report Status  11/15/2015 FINAL  Final  Acid Fast Smear (AFB)     Status: None   Collection Time: 11/12/15  2:13 PM  Result Value Ref Range Status   AFB Specimen Processing Comment  Final    Comment: Tissue Grinding and Digestion/Decontamination   Acid Fast Smear Negative  Final    Comment: (NOTE) Performed At: [facility] [place], [place] [id number] Lindon Romp MD [id number]    Source (AFB) TISSUE  Final    Comment: LYMPH NODE RIGHT CERVICAL LYMPH NODE      Labs: Basic Metabolic Panel:  Recent Labs Lab 11/09/15 2133 11/10/15 0351 11/13/15 0709  NA 137 133* 137  K 3.6 3.3* 3.7  CL 103 103 104  CO2 21* 18* 22  GLUCOSE 92 97 112*  BUN <5* <5* <5*  CREATININE 0.77 0.74 0.80  CALCIUM 8.7* 7.9* 8.8*   Liver Function Tests:  Recent Labs Lab 11/09/15 2133  AST 30  ALT 41  ALKPHOS 42  BILITOT 0.3  PROT 7.2  ALBUMIN 3.3*   CBC:  Recent Labs Lab 11/09/15 2133 11/10/15 0351 11/11/15 0737  WBC 5.2 4.4 3.5*  NEUTROABS 2.9  --   --   HGB 10.9* 10.2* 11.1*  HCT 33.2* 30.8* 34.0*  MCV 82.2 82.6 82.5  PLT 222 177 251     SIGNED: Time coordinating discharge: Over 30  minutes  Gilles Chiquito, MD  [facility] 11/15/2015, 7:49 AM Pager [phone]  If 7PM-7AM, please contact night-coverage [email or web address] Password [email or web address]

## 2015-11-15 NOTE — Discharge Instructions (Addendum)
Nicole Alexander - -   You will go home with some new medications.   For your neck swelling/lymphadenopathy, you should take the antibiotic Clindamycin 600mg  (2 tab) three times a day for 5 more days.  You will also have a small amount of pain medication.  Please make sure to keep these medications out of the reach of children.    You were also found to have some iron deficiency.  You will be prescribed iron on your discharge to take once a day.  Iron can be constipating, so I sent in a stool softener as well.    Please call the [facility] ID clinic on Monday to ensure that you have an appointment to follow up your biopsy results.     Follow-up Information    Follow up with Alcide Evener, MD. Schedule an appointment as soon as possible for a visit in 1 week.   Specialty:  Infectious Diseases   Contact information:   [address] [place] [address] [phone]       Gilles Chiquito                  11/15/2015, 7:50 AM  Follow with Primary MD FULP, CAMMIE, MD in 7 days   Get CBC, CMPchecked  by Primary MD next visit.    Activity: As tolerated with Full fall precautions use walker/cane & assistance as needed   Disposition Home    Diet:   Heart Healthy   On your next visit with your primary care physician please Get Medicines reviewed and adjusted.   If you experience worsening of your admission symptoms, develop shortness of breath, life threatening emergency, suicidal or homicidal thoughts you must seek medical attention immediately by calling 911 or calling your MD immediately  if symptoms less severe.  You Must read complete instructions/literature along with all the possible adverse reactions/side effects for all the Medicines you take and that have been prescribed to you. Take any new Medicines after you have completely understood and accpet all the possible adverse reactions/side effects.   Do not drive, operating heavy machinery, perform  activities at heights, swimming or participation in water activities or provide baby sitting services if your were admitted for syncope or siezures until you have seen by Primary MD or a Neurologist and advised to do so again.  Do not drive when taking Pain medications.   Do not take more than prescribed Pain, Sleep and Anxiety Medications  Special Instructions: If you have smoked or chewed Tobacco  in the last 2 yrs please stop smoking, stop any regular Alcohol  and or any Recreational drug use.  Wear Seat belts while driving.   Please note  You were cared for by a hospitalist during your hospital stay. If you have any questions about your discharge medications or the care you received while you were in the hospital after you are discharged, you can call the unit and asked to speak with the hospitalist on call if the hospitalist that took care of you is not available. Once you are discharged, your primary care physician will handle any further medical issues. Please note that NO REFILLS for any discharge medications will be authorized once you are discharged, as it is imperative that you return to your primary care physician (or establish a relationship with a primary care physician if you do not have one) for your aftercare needs so that they can reassess your need for medications and monitor  your lab values.   Clindamycin capsules What is this medicine? CLINDAMYCIN ([place] sin) is a lincosamide antibiotic. It is used to treat certain kinds of bacterial infections. It will not work for colds, flu, or other viral infections. This medicine may be used for other purposes; ask your health care provider or pharmacist if you have questions. What should I tell my health care provider before I take this medicine? They need to know if you have any of these conditions: -kidney disease -liver disease -stomach problems like colitis -an unusual or allergic reaction to clindamycin, lincomycin, or  other medicines, foods, dyes like tartrazine or preservatives -pregnant or trying to get pregnant -breast-feeding How should I use this medicine? Take this medicine by mouth with a full glass of water. Follow the directions on the prescription label. You can take this medicine with food or on an empty stomach. If the medicine upsets your stomach, take it with food. Take your medicine at regular intervals. Do not take your medicine more often than directed. Take all of your medicine as directed even if you think your are better. Do not skip doses or stop your medicine early. Talk to your pediatrician regarding the use of this medicine in children. Special care may be needed. Overdosage: If you think you have taken too much of this medicine contact a poison control center or emergency room at once. NOTE: This medicine is only for you. Do not share this medicine with others. What if I miss a dose? If you miss a dose, take it as soon as you can. If it is almost time for your next dose, take only that dose. Do not take double or extra doses. What may interact with this medicine? -birth control pills -chloramphenicol -erythromycin -kaolin products This list may not describe all possible interactions. Give your health care provider a list of all the medicines, herbs, non-prescription drugs, or dietary supplements you use. Also tell them if you smoke, drink alcohol, or use illegal drugs. Some items may interact with your medicine. What should I watch for while using this medicine? Tell your doctor or healthcare professional if your symptoms do not start to get better or if they get worse. Do not treat diarrhea with over the counter products. Contact your doctor if you have diarrhea that lasts more than 2 days or if it is severe and watery. What side effects may I notice from receiving this medicine? Side effects that you should report to your doctor or health care professional as soon as  possible: -allergic reactions like skin rash, itching or hives, swelling of the face, lips, or tongue -dark urine -pain on swallowing -redness, blistering, peeling or loosening of the skin, including inside the mouth -unusual bleeding or bruising -unusually weak or tired -yellowing of eyes or skin Side effects that usually do not require medical attention (report to your doctor or health care professional if they continue or are bothersome): -diarrhea -itching in the rectal or genital area -joint pain -nausea, vomiting -stomach pain This list may not describe all possible side effects. Call your doctor for medical advice about side effects. You may report side effects to FDA at 1-800-FDA-1088. Where should I keep my medicine? Keep out of the reach of children. Store at room temperature between 20 and 25 degrees C (68 and 77 degrees F). Throw away any unused medicine after the expiration date. NOTE: This sheet is a summary. It may not cover all possible information. If you have questions  about this medicine, talk to your doctor, pharmacist, or health care provider.    2016, Elsevier/Gold Standard. (2013-02-07 16:12:32)  Iron Deficiency Anemia, Adult Anemia is a condition in which there are less red blood cells or hemoglobin in the blood than normal. Hemoglobin is the part of red blood cells that carries oxygen. Iron deficiency anemia is anemia caused by too little iron. It is the most common type of anemia. It may leave you tired and short of breath. CAUSES   Lack of iron in the diet.  Poor absorption of iron, as seen with intestinal disorders.  Intestinal bleeding.  Heavy periods. SIGNS AND SYMPTOMS  Mild anemia may not be noticeable. Symptoms may include:  Fatigue.  Headache.  Pale skin.  Weakness.  Tiredness.  Shortness of breath.  Dizziness.  Cold hands and feet.  Fast or irregular heartbeat. DIAGNOSIS  Diagnosis requires a thorough evaluation and physical  exam by your health care provider. Blood tests are generally used to confirm iron deficiency anemia. Additional tests may be done to find the underlying cause of your anemia. These may include:  Testing for blood in the stool (fecal occult blood test).  A procedure to see inside the colon and rectum (colonoscopy).  A procedure to see inside the esophagus and stomach (endoscopy). TREATMENT  Iron deficiency anemia is treated by correcting the cause of the deficiency. Treatment may involve:  Adding iron-rich foods to your diet.  Taking iron supplements. Pregnant or breastfeeding women need to take extra iron because their normal diet usually does not provide the required amount.  Taking vitamins. Vitamin C improves the absorption of iron. Your health care provider may recommend that you take your iron tablets with a glass of orange juice or vitamin C supplement.  Medicines to make heavy menstrual flow lighter.  Surgery. HOME CARE INSTRUCTIONS   Take iron as directed by your health care provider.  If you cannot tolerate taking iron supplements by mouth, talk to your health care provider about taking them through a vein (intravenously) or an injection into a muscle.  For the best iron absorption, iron supplements should be taken on an empty stomach. If you cannot tolerate them on an empty stomach, you may need to take them with food.  Do not drink milk or take antacids at the same time as your iron supplements. Milk and antacids may interfere with the absorption of iron.  Iron supplements can cause constipation. Make sure to include fiber in your diet to prevent constipation. A stool softener may also be recommended.  Take vitamins as directed by your health care provider.  Eat a diet rich in iron. Foods high in iron include liver, lean beef, whole-grain bread, eggs, dried fruit, and dark green leafy vegetables. SEEK IMMEDIATE MEDICAL CARE IF:   You faint. If this happens, do not  drive. Call your local emergency services (911 in [place] if no other help is available.  You have chest pain.  You feel nauseous or vomit.  You have severe or increased shortness of breath with activity.  You feel weak.  You have a rapid heartbeat.  You have unexplained sweating.  You become light-headed when getting up from a chair or bed. MAKE SURE YOU:   Understand these instructions.  Will watch your condition.  Will get help right away if you are not doing well or get worse.   This information is not intended to replace advice given to you by your health care provider. Make sure you  discuss any questions you have with your health care provider.   Document Released: 07/01/2000 Document Revised: 07/25/2014 Document Reviewed: 03/11/2013 Elsevier Interactive Patient Education [employer].

## 2015-11-16 LAB — QUANTIFERON IN TUBE
QFT TB AG MINUS NIL VALUE: 0.12 [IU]/mL
QUANTIFERON MITOGEN VALUE: 9.17 IU/mL
QUANTIFERON TB AG VALUE: 1.38 IU/mL
QUANTIFERON TB GOLD: NEGATIVE
Quantiferon Nil Value: 1.26 IU/mL

## 2015-11-16 LAB — QUANTIFERON TB GOLD ASSAY (BLOOD)

## 2015-11-25 ENCOUNTER — Encounter: Payer: Self-pay | Admitting: Infectious Disease

## 2015-11-25 ENCOUNTER — Ambulatory Visit (INDEPENDENT_AMBULATORY_CARE_PROVIDER_SITE_OTHER): Payer: 59 | Admitting: Infectious Disease

## 2015-11-25 ENCOUNTER — Other Ambulatory Visit: Payer: Self-pay | Admitting: Family Medicine

## 2015-11-25 VITALS — HR 78 | Temp 98.3°F | Wt 238.0 lb

## 2015-11-25 DIAGNOSIS — E041 Nontoxic single thyroid nodule: Secondary | ICD-10-CM

## 2015-11-25 DIAGNOSIS — R591 Generalized enlarged lymph nodes: Secondary | ICD-10-CM | POA: Diagnosis not present

## 2015-11-25 DIAGNOSIS — Z09 Encounter for follow-up examination after completed treatment for conditions other than malignant neoplasm: Secondary | ICD-10-CM

## 2015-11-25 DIAGNOSIS — R599 Enlarged lymph nodes, unspecified: Secondary | ICD-10-CM

## 2015-11-25 DIAGNOSIS — B999 Unspecified infectious disease: Secondary | ICD-10-CM | POA: Diagnosis not present

## 2015-11-25 DIAGNOSIS — A419 Sepsis, unspecified organism: Secondary | ICD-10-CM

## 2015-11-25 DIAGNOSIS — B954 Other streptococcus as the cause of diseases classified elsewhere: Secondary | ICD-10-CM

## 2015-11-25 DIAGNOSIS — M542 Cervicalgia: Secondary | ICD-10-CM

## 2015-11-25 DIAGNOSIS — I889 Nonspecific lymphadenitis, unspecified: Secondary | ICD-10-CM | POA: Diagnosis not present

## 2015-11-25 DIAGNOSIS — N760 Acute vaginitis: Secondary | ICD-10-CM

## 2015-11-25 DIAGNOSIS — D649 Anemia, unspecified: Secondary | ICD-10-CM

## 2015-11-25 DIAGNOSIS — I808 Phlebitis and thrombophlebitis of other sites: Secondary | ICD-10-CM

## 2015-11-25 DIAGNOSIS — N492 Inflammatory disorders of scrotum: Secondary | ICD-10-CM

## 2015-11-25 DIAGNOSIS — N179 Acute kidney failure, unspecified: Secondary | ICD-10-CM

## 2015-11-25 DIAGNOSIS — B955 Unspecified streptococcus as the cause of diseases classified elsewhere: Secondary | ICD-10-CM

## 2015-11-25 DIAGNOSIS — R7881 Bacteremia: Secondary | ICD-10-CM

## 2015-11-25 HISTORY — DX: Nonspecific lymphadenitis, unspecified: I88.9

## 2015-11-25 MED ORDER — CLINDAMYCIN HCL 300 MG PO CAPS: 600.0000 mg | ORAL_CAPSULE | Freq: Three times a day (TID) | ORAL | Status: DC

## 2015-11-25 NOTE — Progress Notes (Signed)
Subjective:  Chief complaint: Pain in her neck or she still has swollen lymph nodes as well as in her upper chest and subclavicular area.    Patient ID: Nicole Alexander, female    DOB: 04-29-1976, 40 y.o.   MRN: [id number]  HPI  40 year old Afro-American lady who has suffered from recurrent head and neck infection since she was a teenager. This is become more severe in recent years. Of note she was seen by your nose and throat well in your state for consideration of tonsillectomy as she hasn't large tonsils that may be playing a role in her recurrent infections. At that time and she was age of 78 surgery was not pursued.  In recent years she has been having recurrent head and neck infections typically with severe cervical lymphadenopathy proximally once per year. She has been admitted to [facility] at least 3 times for couple occasions related to this. In 2014 in fact she developed bulimia errors syndrome with streptococcal bacteremia in association with severe infection of her neck.  She was hospitalized again with sepsis in April and treated with antibiotics. Her lymph nodes were severely enlarged and she ultimately underwent a lymph node biopsy via fine needle aspirate.   Initial CT showed:  1. Normal CTA of the neck. 2. Extensive bulky right-sided cervical adenopathy with associated inflammatory stranding, consistent with suppurative adenitis/reactive nodal disease. Clinical followup to resolution recommended. No jugular vein thrombosis identified on this exam, although evaluation somewhat limited by timing of the contrast bolus. 3. Approximate 15 mm heterogeneous left thyroid nodule, indeterminate. Further evaluation with dedicated thyroid ultrasound recommended. This could be performed on a nonemergent basis.  Biopsy showed necrotizing inflammation consistent with a pyogenic infection. She was treated with IV and then oral clindamycin and has had improvement in her fevers and  neck pain though she continues to have persistent cervical lymphadenopathy that is slowly improving she also continues to feel profoundly fatigued though that also is improving.  She has not yet returned to work.  I think she clearly needs to be evaluated by ear nose and throat for consideration of surgery potentially a tonsillectomy if this might improve her outcome and reduce the number of recurrent infections that she is prone to.  In the interim I prescribed clindamycin that she could have with her on hand if when one of these episodes flares. Certainly 3 hospitalizations in the course of a few years is some and it would in my mind prompt need for a surgical evaluation. The Mn[place]'s itself certainly can be a highly morbid condition.  I've offered to do immunological workup for immune deficiency though I think this is more an anatomic issue rather than an immunodeficiency syndrome. She did not want blood work done for that today.  I did fill out FMLA forms for her.  No past medical history on file.  Past Surgical History  Procedure Laterality Date  . Breast surgery      breast reduction  . Cholecystectomy N/A 11/28/2012    Procedure: LAPAROSCOPIC CHOLECYSTECTOMY WITH INTRAOPERATIVE CHOLANGIOGRAM;  Surgeon: Zenovia Jarred, MD;  Location: [facility];  Service: General;  Laterality: N/A;    Family History  Problem Relation Age of Onset  . Heart disease Mother     PTCA/stent  . Hypertension Mother   . Diabetes Mother   . Hyperlipidemia Mother   . Cancer Father     lung  . Hypertension Father   . Diabetes Father   . Hyperlipidemia Father   .  Hypertension Brother   . Cancer Maternal Grandfather     bone      Social History   Social History  . Marital Status: Married    Spouse Name: N/A  . Number of Children: N/A  . Years of Education: N/A   Social History Main Topics  . Smoking status: Former Smoker -- 0.50 packs/day for 4 years  . Smokeless tobacco: Never Used  . Alcohol  Use: Yes     Comment: occasional  . Drug Use: No  . Sexual Activity: Yes     Comment: married since 12-2003   Other Topics Concern  . None   Social History Narrative    Allergies  Allergen Reactions  . Penicillins Swelling    "swole up like a balloon" Has patient had a PCN reaction causing immediate rash, facial/tongue/throat swelling, SOB or lightheadedness with hypotension: YES Has patient had a PCN reaction causing severe rash involving mucus membranes or skin necrosis: NO Has patient had a PCN reaction that required hospitalization NO Has patient had a PCN reaction occurring within the last 10 years: NO If all of the above answers are "NO", then may proceed with Cephalosporin use.     Current outpatient prescriptions:  .  docusate sodium (COLACE) 100 MG capsule, Take 1 capsule (100 mg total) by mouth daily., Disp: 10 capsule, Rfl: 0 .  ferrous sulfate 325 (65 FE) MG tablet, Take 1 tablet (325 mg total) by mouth daily with breakfast., Disp: 30 tablet, Rfl: 3 .  oxyCODONE-acetaminophen (PERCOCET/ROXICET) 5-325 MG tablet, Take 1 tablet by mouth every 4 (four) hours as needed for moderate pain or severe pain., Disp: 20 tablet, Rfl: 0 .  clindamycin (CLEOCIN) 300 MG capsule, Take 2 capsules (600 mg total) by mouth every 8 (eight) hours., Disp: 60 capsule, Rfl: 5   Review of Systems  Constitutional: Positive for activity change and fatigue. Negative for fever, chills, diaphoresis, appetite change and unexpected weight change.  HENT: Negative for congestion, rhinorrhea, sinus pressure, sneezing, sore throat and trouble swallowing.   Eyes: Negative for photophobia and visual disturbance.  Respiratory: Negative for cough, chest tightness, shortness of breath, wheezing and stridor.   Cardiovascular: Negative for chest pain, palpitations and leg swelling.  Gastrointestinal: Negative for nausea, vomiting, abdominal pain, diarrhea, constipation, blood in stool, abdominal distention and anal  bleeding.  Genitourinary: Negative for dysuria, hematuria, flank pain and difficulty urinating.  Musculoskeletal: Positive for neck pain. Negative for myalgias, back pain, joint swelling, arthralgias and gait problem.  Skin: Negative for color change, pallor, rash and wound.  Neurological: Negative for dizziness, tremors, weakness and light-headedness.  Hematological: Positive for adenopathy. Does not bruise/bleed easily.  Psychiatric/Behavioral: Negative for behavioral problems, confusion, sleep disturbance, dysphoric mood, decreased concentration and agitation.       Objective:   Physical Exam  Constitutional: She is oriented to person, place, and time. She appears well-developed and well-nourished. No distress.  HENT:  Head: Normocephalic and atraumatic.  Mouth/Throat: No oropharyngeal exudate.  Eyes: Conjunctivae and EOM are normal. No scleral icterus.  Neck: Normal range of motion. Neck supple.  Cardiovascular: Normal rate and regular rhythm.   Pulmonary/Chest: Effort normal. No respiratory distress. She has no wheezes.  Abdominal: She exhibits no distension.  Musculoskeletal: She exhibits no edema or tenderness.  Lymphadenopathy:       Head (right side): Submental, submandibular, preauricular and posterior auricular adenopathy present.       Head (left side): Submental, submandibular and tonsillar adenopathy present.  She has cervical adenopathy.       Right cervical: Superficial cervical, deep cervical and posterior cervical adenopathy present.       Right: Supraclavicular adenopathy present.  Neurological: She is alert and oriented to person, place, and time. She exhibits normal muscle tone. Coordination normal.  Skin: Skin is warm and dry. No rash noted. She is not diaphoretic. No erythema. No pallor.  Psychiatric: She has a normal mood and affect. Her behavior is normal. Judgment and thought content normal.          Assessment & Plan:   Recurrent Head and Neck  infections with severe Cervical Lymphadenitis  and prior Lemierries' syndrome that occurred in the context of enlarged tonsils and recurrent infection. Hospitalized 3 times the past 3 years. I think she needs evaluation by ear nose and throat for consideration of tonsillectomy since this may be playing a role in her recurrent infections.  In the interim I threatened for clindamycin that she can have on hand to begin when she began to notice symptoms of infection beginning to show themselves. Typically she has a fairly rapid deterioration when she has initial onset of symptoms and as mentioned has been admitted to the hospital with sepsis several times.  I spent greater than 40 minutes with the patient including greater than 50% of time in face to face counsel of the patient regarding her recurrent cervical lymphadenitis prior Lemierres, filling out paperwork for FMLA  and in coordination of  Her  care.

## 2015-11-30 ENCOUNTER — Ambulatory Visit
Admission: RE | Admit: 2015-11-30 | Discharge: 2015-11-30 | Disposition: A | Discharge status: Home / Self Care | Payer: 59 | Source: Ambulatory Visit | Attending: Family Medicine | Admitting: Family Medicine

## 2015-11-30 DIAGNOSIS — Z09 Encounter for follow-up examination after completed treatment for conditions other than malignant neoplasm: Secondary | ICD-10-CM

## 2015-11-30 DIAGNOSIS — E041 Nontoxic single thyroid nodule: Secondary | ICD-10-CM

## 2015-12-11 ENCOUNTER — Other Ambulatory Visit: Payer: Self-pay | Admitting: Otolaryngology

## 2015-12-11 DIAGNOSIS — E041 Nontoxic single thyroid nodule: Secondary | ICD-10-CM

## 2015-12-15 LAB — FUNGAL ORGANISM REFLEX

## 2015-12-15 LAB — FUNGUS CULTURE WITH STAIN

## 2015-12-15 LAB — FUNGUS CULTURE RESULT

## 2015-12-23 ENCOUNTER — Other Ambulatory Visit (HOSPITAL_COMMUNITY)
Admission: RE | Admit: 2015-12-23 | Discharge: 2015-12-23 | Disposition: A | Discharge status: Home / Self Care | Payer: 59 | Source: Ambulatory Visit | Attending: Interventional Radiology | Admitting: Interventional Radiology

## 2015-12-23 ENCOUNTER — Ambulatory Visit
Admission: RE | Admit: 2015-12-23 | Discharge: 2015-12-23 | Disposition: A | Discharge status: Home / Self Care | Payer: 59 | Source: Ambulatory Visit | Attending: Otolaryngology | Admitting: Otolaryngology

## 2015-12-23 DIAGNOSIS — E041 Nontoxic single thyroid nodule: Secondary | ICD-10-CM | POA: Insufficient documentation

## 2015-12-28 LAB — ACID FAST CULTURE WITH REFLEXED SENSITIVITIES (MYCOBACTERIA): Acid Fast Culture: NEGATIVE

## 2016-03-02 ENCOUNTER — Ambulatory Visit: Payer: 59 | Admitting: Infectious Disease

## 2016-03-02 ENCOUNTER — Telehealth: Payer: Self-pay | Admitting: *Deleted

## 2016-03-02 NOTE — Telephone Encounter (Signed)
Unable to reach patient by phone 

## 2016-04-28 ENCOUNTER — Other Ambulatory Visit: Payer: Self-pay | Admitting: Otolaryngology

## 2016-07-26 DIAGNOSIS — R87612 Low grade squamous intraepithelial lesion on cytologic smear of cervix (LGSIL): Secondary | ICD-10-CM | POA: Diagnosis not present

## 2017-06-12 DIAGNOSIS — Z1231 Encounter for screening mammogram for malignant neoplasm of breast: Secondary | ICD-10-CM | POA: Diagnosis not present

## 2017-06-12 DIAGNOSIS — Z803 Family history of malignant neoplasm of breast: Secondary | ICD-10-CM | POA: Diagnosis not present

## 2017-09-05 IMAGING — CT CT NECK W/ CM
4 series · 15 of 33 positions shown, 18 images · IV contrast (APPLIED)
Comparison: Neck CTA 11/09/2015

CLINICAL DATA: Persistent fevers. Painful cervical lymphadenopathy.

EXAM:
CT NECK WITH CONTRAST
TECHNIQUE: Multidetector CT imaging of the neck was performed using the
standard protocol following the bolus administration of intravenous
contrast.
CONTRAST:  75 mL [id number]

[Series 3: neck 2.0 i31s 3 · axial · 0.47mm/px · z∈[-799,-647]mm · 5 of 116 slices shown, 7 images]
[im 20/116  soft-tissue]
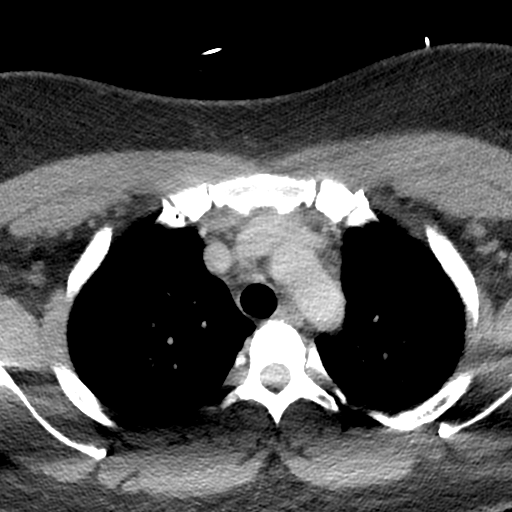
[im 20/116  bone]
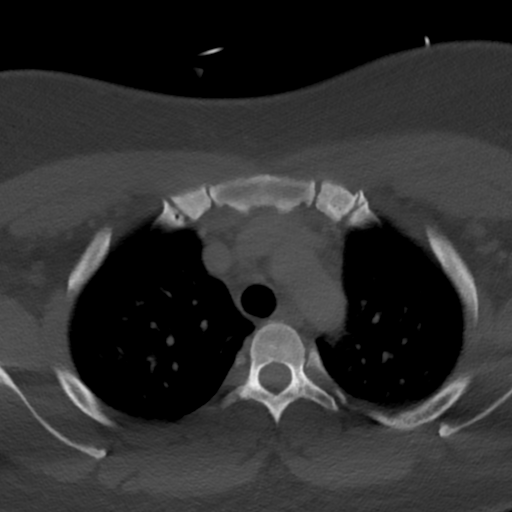
[im 39/116  bone]
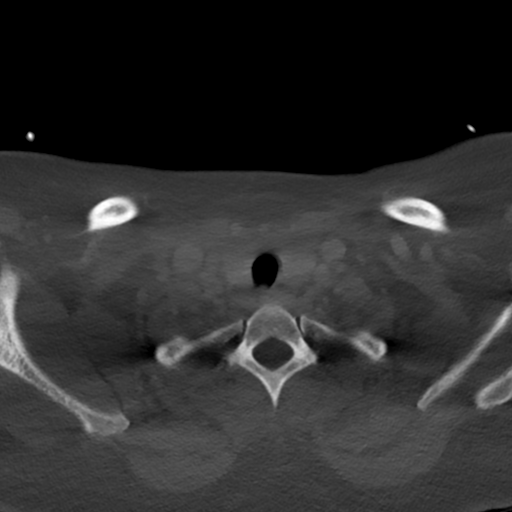
[im 58/116  bone]
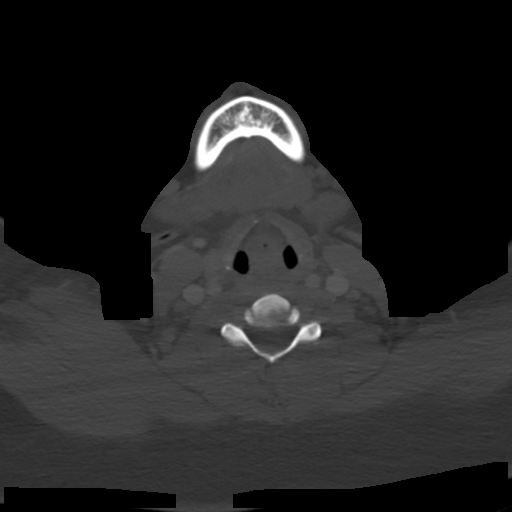
[im 77/116  bone]
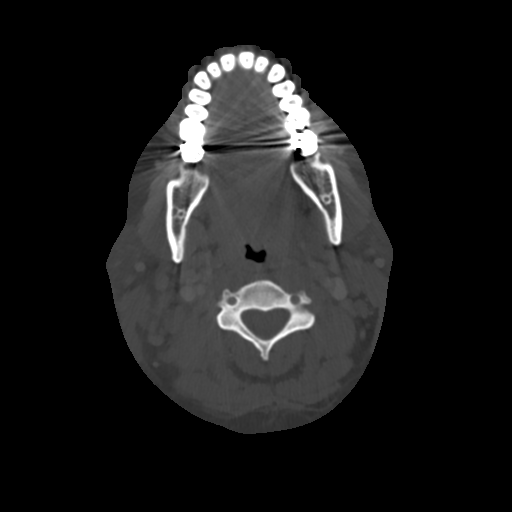
[im 96/116  soft-tissue]
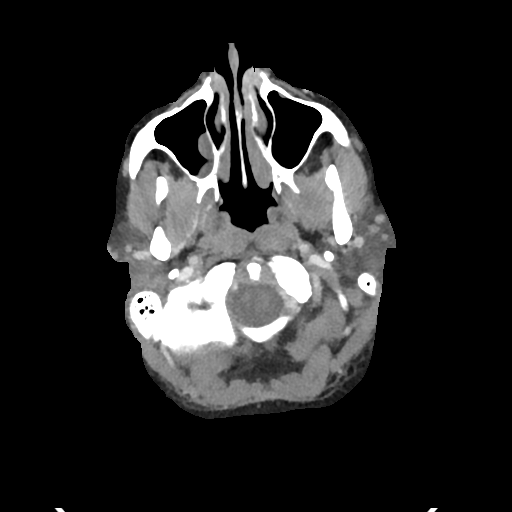
[im 96/116  bone]
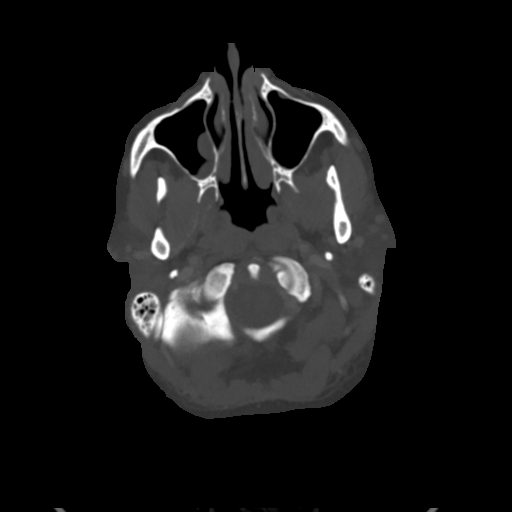

[Series 6: coronal st · coronal · 0.46mm/px · 3 of 118 slices shown]
[im 24/118  bone]
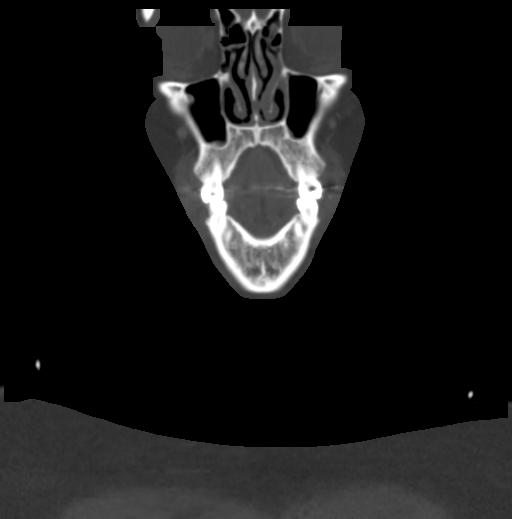
[im 47/118  bone]
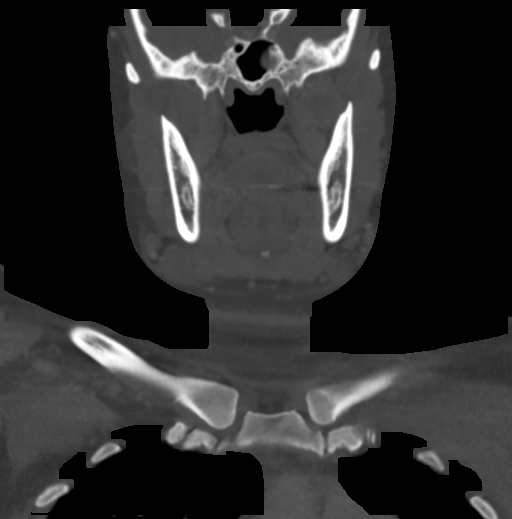
[im 71/118  bone]
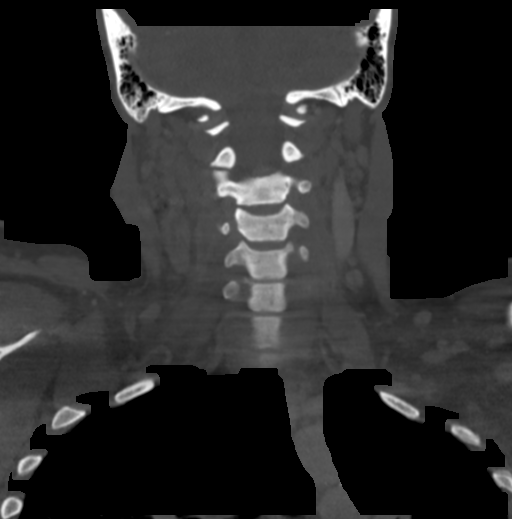

[Series 7: sagittal st · sagittal · 0.46mm/px · 5 of 92 slices shown, 6 images]
[im 31/92  bone]
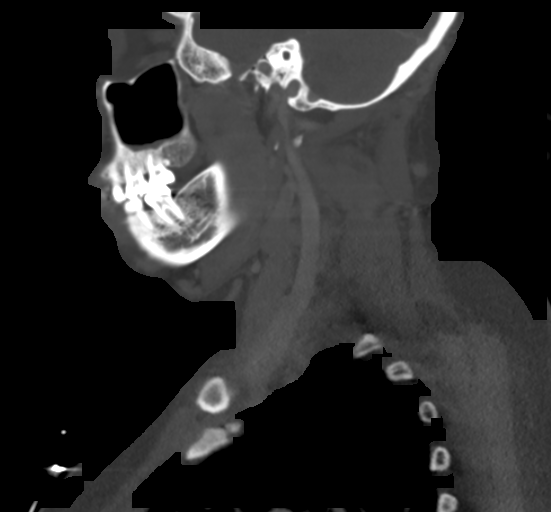
[im 38/92  bone]
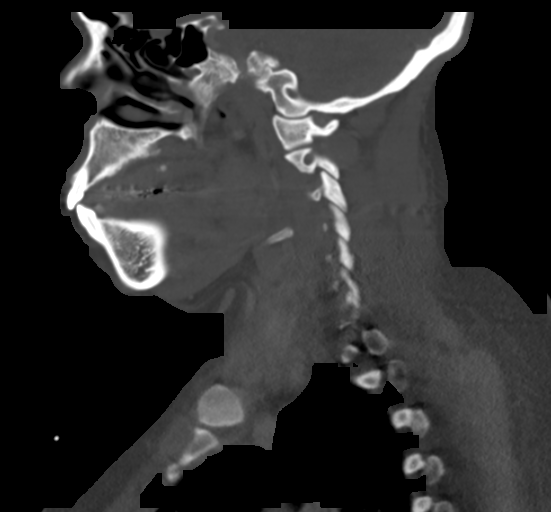
[im 46/92  soft-tissue]
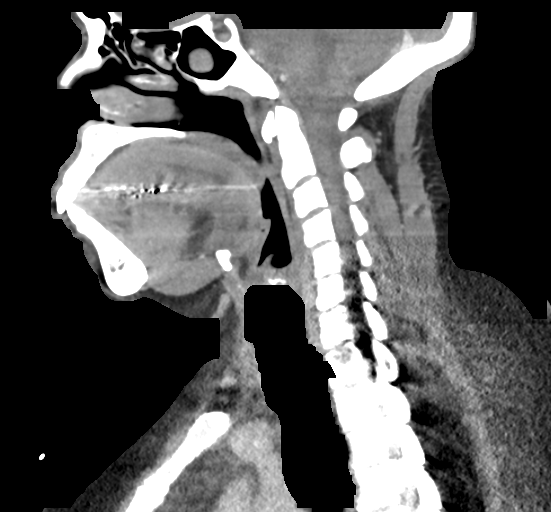
[im 46/92  bone]
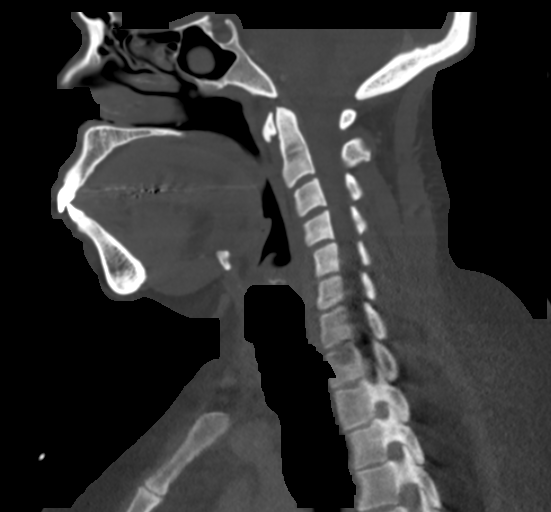
[im 54/92  bone]
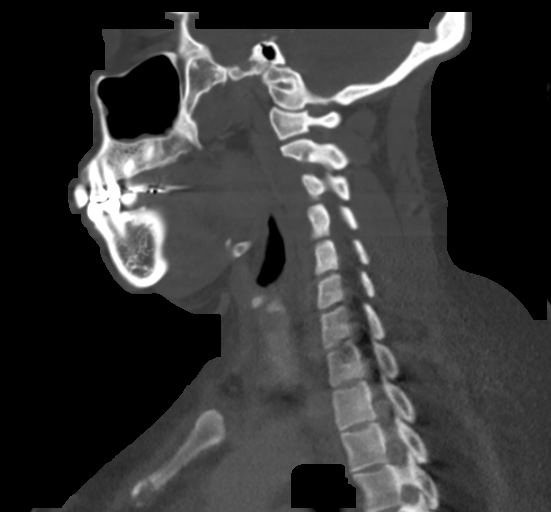
[im 61/92  bone]
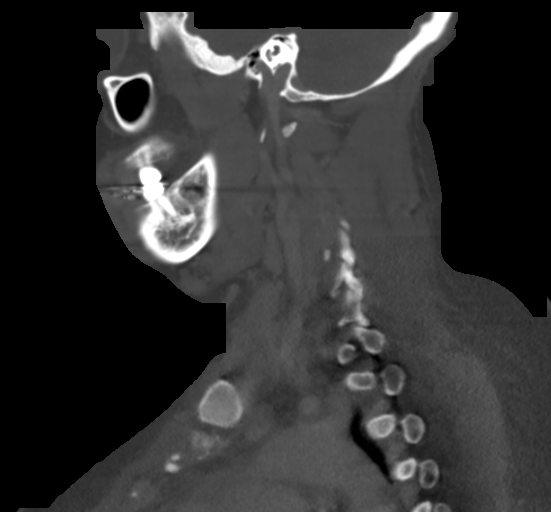

[Series 8: orthogonal st · axial · 0.39mm/px · z∈[-798,-760]mm · 2 of 117 slices shown]
[im 20/117  bone]
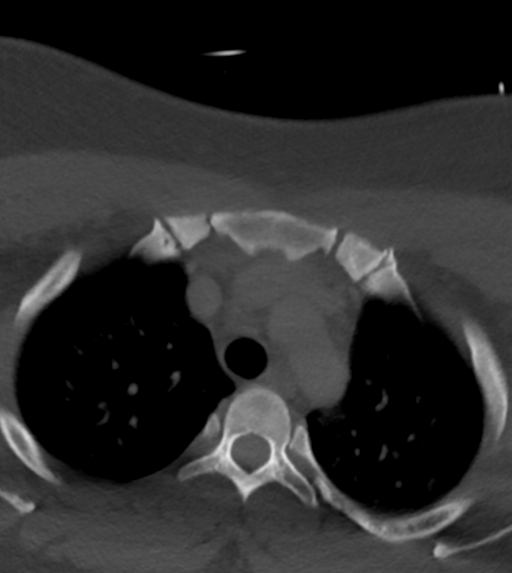
[im 39/117  bone]
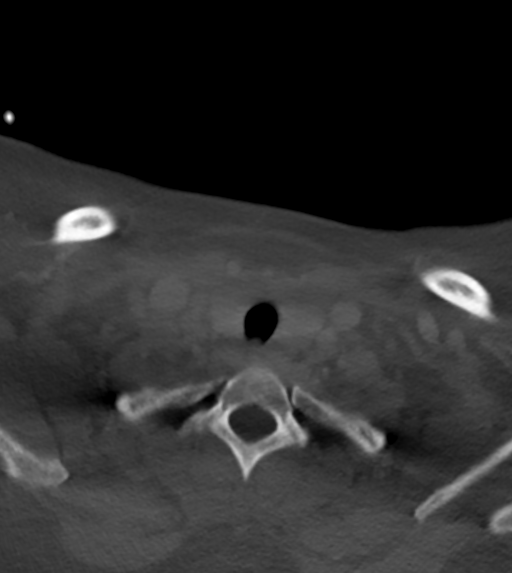

[15 of 33 positions shown; findings below may reference images not displayed]

FINDINGS: Pharynx and larynx: Symmetric bilateral palatine tonsillar
enlargement which contact one another in the midline and efface the
upper oropharyngeal airway. Tonsillar enlargement is stable to
minimally greater than on the prior CTA. No tonsillar/peritonsillar
abscess is identified. Larynx is unremarkable.

Salivary glands: Submandibular and parotid glands are unremarkable.

Thyroid: The left thyroid lobe nodule described on the prior CTA is
suboptimally visualized due to quantum mottle from patient body
habitus.

Lymph nodes: Multiple enlarged right level II lymph nodes are again
seen, with the largest measuring 1.5 cm in short axis (previously
2.1 cm). Inflammatory stranding is again seen in this region and
appears minimally improved. Right level V lymph nodes measure up to
8 mm in short axis (previously 10 mm). Right level III lymph nodes
measure up to 8 mm in short axis, similar to prior. Left level II
and upper left level III lymph nodes measure up to 9 mm in short
axis and appear stable to minimally larger than on the prior study.
No fluid collection/abscess is identified.

Vascular: Major vascular structures of the neck appear patent,
including the internal jugular veins.

Limited intracranial: The visualized portion of the brain is
unremarkable.

Visualized orbits: Unremarkable.

Mastoids and visualized paranasal sinuses: Mild paranasal sinus
mucosal thickening including polypoid mucosal thickening/ mucous
retention cysts in the left sphenoid and right maxillary sinuses,
unchanged. No sinus fluid levels. Unchanged, small right mastoid
effusion.

Skeleton: No acute osseous abnormality identified.

Upper chest: Visualized lung apices are clear.
IMPRESSION: 1. Mildly improved right cervical lymphadenopathy and inflammatory
stranding. No evidence of abscess.
2. Stable to slightly increased size of left level II/III lymph
nodes.
3. Symmetric tonsillar enlargement.

## 2017-11-07 DIAGNOSIS — Z01419 Encounter for gynecological examination (general) (routine) without abnormal findings: Secondary | ICD-10-CM | POA: Diagnosis not present

## 2017-11-21 DIAGNOSIS — E041 Nontoxic single thyroid nodule: Secondary | ICD-10-CM | POA: Diagnosis not present

## 2017-11-21 DIAGNOSIS — I808 Phlebitis and thrombophlebitis of other sites: Secondary | ICD-10-CM | POA: Diagnosis not present

## 2017-11-21 DIAGNOSIS — J309 Allergic rhinitis, unspecified: Secondary | ICD-10-CM | POA: Diagnosis not present

## 2017-12-18 ENCOUNTER — Other Ambulatory Visit: Payer: Self-pay | Admitting: Internal Medicine

## 2017-12-18 DIAGNOSIS — R2232 Localized swelling, mass and lump, left upper limb: Secondary | ICD-10-CM | POA: Diagnosis not present

## 2017-12-18 DIAGNOSIS — E041 Nontoxic single thyroid nodule: Secondary | ICD-10-CM | POA: Diagnosis not present

## 2017-12-18 DIAGNOSIS — D72828 Other elevated white blood cell count: Secondary | ICD-10-CM | POA: Diagnosis not present

## 2017-12-20 DIAGNOSIS — Z0001 Encounter for general adult medical examination with abnormal findings: Secondary | ICD-10-CM | POA: Diagnosis not present

## 2017-12-22 ENCOUNTER — Encounter: Payer: Self-pay | Admitting: Hematology and Oncology

## 2017-12-22 ENCOUNTER — Telehealth: Payer: Self-pay | Admitting: Hematology and Oncology

## 2017-12-22 NOTE — Telephone Encounter (Signed)
Pt has been referred back to our office from Dr. Coralyn Mark at [place] for a dx of elevated wbc. Pt is established with Dr. Lindi Adie and has been scheduled on 6/17 at 330pm. Letter with appt information mailed to the pt.

## 2017-12-25 ENCOUNTER — Ambulatory Visit
Admission: RE | Admit: 2017-12-25 | Discharge: 2017-12-25 | Disposition: A | Discharge status: Home / Self Care | Payer: 59 | Source: Ambulatory Visit | Attending: Internal Medicine | Admitting: Internal Medicine

## 2017-12-25 DIAGNOSIS — E041 Nontoxic single thyroid nodule: Secondary | ICD-10-CM | POA: Diagnosis not present

## 2017-12-29 DIAGNOSIS — L720 Epidermal cyst: Secondary | ICD-10-CM | POA: Diagnosis not present

## 2018-01-01 ENCOUNTER — Inpatient Hospital Stay: Payer: 59 | Attending: Hematology and Oncology | Admitting: Hematology and Oncology

## 2018-01-01 ENCOUNTER — Telehealth: Payer: Self-pay | Admitting: Hematology and Oncology

## 2018-01-01 DIAGNOSIS — D72820 Lymphocytosis (symptomatic): Secondary | ICD-10-CM | POA: Diagnosis not present

## 2018-01-01 DIAGNOSIS — Z79899 Other long term (current) drug therapy: Secondary | ICD-10-CM | POA: Diagnosis not present

## 2018-01-01 NOTE — Assessment & Plan Note (Signed)
Previous work-up but did not reveal any clear-cut etiology.  It was felt to be reactive or inflammation or infection related.  Patient had painful lymphadenopathy and inflammation.  Work-up was negative for flow cytometry as well as blood smear evaluation. Patient was referred back to us because he was having continued elevation of white blood cell count and lymphocytes.  Lab review:  

## 2018-01-01 NOTE — Progress Notes (Signed)
Patient Care Team: Antony Blackbird, MD (Inactive) as PCP - General (Family Medicine)  DIAGNOSIS:  Encounter Diagnosis  Name Primary?  . Lymphocytosis     CHIEF COMPLIANT: Follow-up of blood count issues  INTERVAL HISTORY: Nicole Alexander is a 42 year old with above-mentioned history of lymphocytosis previously worked with flow cytometry which was negative.  She recently changed her primary care physician and is seeing [facility] where she underwent recent blood work which showed abnormalities and she was referred to [place] for discussion of these lab findings.  She does not report any new symptoms or concerns.  She believes that she may need a shoulder surgery but she has been putting it away.  She had previous work-up showing rheumatoid factor being positive but but Anti-CCP was negative.  REVIEW OF SYSTEMS:   Constitutional: Denies fevers, chills or abnormal weight loss Eyes: Denies blurriness of vision Ears, nose, mouth, throat, and face: Denies mucositis or sore throat Respiratory: Denies cough, dyspnea or wheezes Cardiovascular: Denies palpitation, chest discomfort Gastrointestinal:  Denies nausea, heartburn or change in bowel habits Skin: Denies abnormal skin rashes Lymphatics: Denies new lymphadenopathy or easy bruising Neurological:Denies numbness, tingling or new weaknesses Behavioral/Psych: Mood is stable, no new changes  Extremities: No lower extremity edema  All other systems were reviewed with the patient and are negative.  I have reviewed the past medical history, past surgical history, social history and family history with the patient and they are unchanged from previous note.  ALLERGIES:  is allergic to penicillins.  MEDICATIONS:  Current Outpatient Medications  Medication Sig Dispense Refill  . clindamycin (CLEOCIN) 300 MG capsule Take 2 capsules (600 mg total) by mouth every 8 (eight) hours. 60 capsule 5  . docusate sodium (COLACE) 100 MG capsule Take 1  capsule (100 mg total) by mouth daily. 10 capsule 0  . ferrous sulfate 325 (65 FE) MG tablet Take 1 tablet (325 mg total) by mouth daily with breakfast. 30 tablet 3  . oxyCODONE-acetaminophen (PERCOCET/ROXICET) 5-325 MG tablet Take 1 tablet by mouth every 4 (four) hours as needed for moderate pain or severe pain. 20 tablet 0   No current facility-administered medications for this visit.     PHYSICAL EXAMINATION: ECOG PERFORMANCE STATUS: 1 - Symptomatic but completely ambulatory  Vitals:   01/01/18 1540  BP: (!) 139/100  Pulse: (!) 113  Resp: 18  Temp: 98.9 F (37.2 C)  SpO2: 99%   Filed Weights   01/01/18 1540  Weight: 260 lb 3.2 oz (118 kg)    GENERAL:alert, no distress and comfortable SKIN: skin color, texture, turgor are normal, no rashes or significant lesions EYES: normal, Conjunctiva are pink and non-injected, sclera clear OROPHARYNX:no exudate, no erythema and lips, buccal mucosa, and tongue normal  NECK: supple, thyroid normal size, non-tender, without nodularity LYMPH:  no palpable lymphadenopathy in the cervical, axillary or inguinal LUNGS: clear to auscultation and percussion with normal breathing effort HEART: regular rate & rhythm and no murmurs and no lower extremity edema ABDOMEN:abdomen soft, non-tender and normal bowel sounds MUSCULOSKELETAL:no cyanosis of digits and no clubbing  NEURO: alert & oriented x 3 with fluent speech, no focal motor/sensory deficits EXTREMITIES: No lower extremity edema  LABORATORY DATA:  I have reviewed the data as listed CMP Latest Ref Rng & Units 11/15/2015 11/13/2015 11/10/2015  Glucose 65 - 99 mg/dL 105(H) 112(H) 97  BUN 6 - 20 mg/dL <5(L) <5(L) <5(L)  Creatinine 0.44 - 1.00 mg/dL 0.69 0.80 0.74  Sodium 135 - 145 mmol/L  137 137 133(L)  Potassium 3.5 - 5.1 mmol/L 3.8 3.7 3.3(L)  Chloride 101 - 111 mmol/L 106 104 103  CO2 22 - 32 mmol/L 22 22 18(L)  Calcium 8.9 - 10.3 mg/dL 8.6(L) 8.8(L) 7.9(L)  Total Protein 6.5 - 8.1 g/dL -  - -  Total Bilirubin 0.3 - 1.2 mg/dL - - -  Alkaline Phos 38 - 126 U/L - - -  AST 15 - 41 U/L - - -  ALT 14 - 54 U/L - - -    Lab Results  Component Value Date   WBC 3.3 (L) 11/15/2015   HGB 11.7 (L) 11/15/2015   HCT 35.1 (L) 11/15/2015   MCV 82.8 11/15/2015   PLT 283 11/15/2015   NEUTROABS 2.9 11/09/2015    ASSESSMENT & PLAN:  Leukocytosis Previous work-up but did not reveal any clear-cut etiology.  It was felt to be reactive or inflammation or infection related.  Patient had painful lymphadenopathy and inflammation.  Work-up was negative for flow cytometry as well as blood smear evaluation. Patient was referred back to [place] because he was having continued elevation of white blood cell count and lymphocytes.  Lab review: 11/21/2017: WBC 15.4, hemoglobin 12.3, platelets 349, ANC 8.9, ALC 5.1, eosinophils 0.7 I discussed that these findings suggest leukocytosis secondary to inflammation. I discussed with her about exercising 30 minutes every day and losing weight. We also discussed importance of turmeric and vitamin D. If her blood counts significantly get worse then we may have to do a bone marrow biopsy but at this time I do not think there is any need for that.  Please do not hesitate to contact me if there are any major changes in the blood counts.  I would like to follow her once a year with labs.  No orders of the defined types were placed in this encounter.  The patient has a good understanding of the overall plan. she agrees with it. she will call with any problems that may develop before the next visit here.   Harriette Ohara, MD 01/01/18

## 2018-01-01 NOTE — Telephone Encounter (Signed)
Gave patient avs and calendar of upcoming June 2020 appointments.  °

## 2018-01-09 DIAGNOSIS — E041 Nontoxic single thyroid nodule: Secondary | ICD-10-CM | POA: Diagnosis not present

## 2018-01-09 DIAGNOSIS — J209 Acute bronchitis, unspecified: Secondary | ICD-10-CM | POA: Diagnosis not present

## 2018-01-09 DIAGNOSIS — R2232 Localized swelling, mass and lump, left upper limb: Secondary | ICD-10-CM | POA: Diagnosis not present

## 2018-02-16 DIAGNOSIS — L02422 Furuncle of left axilla: Secondary | ICD-10-CM | POA: Diagnosis not present

## 2018-02-21 DIAGNOSIS — L02429 Furuncle of limb, unspecified: Secondary | ICD-10-CM | POA: Diagnosis not present

## 2018-02-21 DIAGNOSIS — Z7689 Persons encountering health services in other specified circumstances: Secondary | ICD-10-CM | POA: Diagnosis not present

## 2018-02-21 DIAGNOSIS — L03112 Cellulitis of left axilla: Secondary | ICD-10-CM | POA: Diagnosis not present

## 2018-02-21 DIAGNOSIS — R509 Fever, unspecified: Secondary | ICD-10-CM | POA: Diagnosis not present

## 2018-02-23 ENCOUNTER — Emergency Department (HOSPITAL_COMMUNITY)
Admission: EM | Admit: 2018-02-23 | Discharge: 2018-02-23 | Disposition: A | Discharge status: Home / Self Care | Payer: 59 | Attending: Emergency Medicine | Admitting: Emergency Medicine

## 2018-02-23 ENCOUNTER — Other Ambulatory Visit: Payer: Self-pay

## 2018-02-23 ENCOUNTER — Encounter (HOSPITAL_COMMUNITY): Payer: Self-pay | Admitting: Emergency Medicine

## 2018-02-23 DIAGNOSIS — R945 Abnormal results of liver function studies: Secondary | ICD-10-CM

## 2018-02-23 DIAGNOSIS — Z79899 Other long term (current) drug therapy: Secondary | ICD-10-CM | POA: Diagnosis not present

## 2018-02-23 DIAGNOSIS — L02412 Cutaneous abscess of left axilla: Secondary | ICD-10-CM | POA: Insufficient documentation

## 2018-02-23 DIAGNOSIS — Z87891 Personal history of nicotine dependence: Secondary | ICD-10-CM | POA: Insufficient documentation

## 2018-02-23 DIAGNOSIS — R600 Localized edema: Secondary | ICD-10-CM | POA: Diagnosis present

## 2018-02-23 DIAGNOSIS — R7989 Other specified abnormal findings of blood chemistry: Secondary | ICD-10-CM

## 2018-02-23 LAB — CBC WITH DIFFERENTIAL/PLATELET
Basophils Absolute: 0 10*3/uL (ref 0.0–0.1)
Basophils Relative: 0 %
Eosinophils Absolute: 0.5 10*3/uL (ref 0.0–0.7)
Eosinophils Relative: 8 %
HCT: 35.2 % — ABNORMAL LOW (ref 36.0–46.0)
Hemoglobin: 11.7 g/dL — ABNORMAL LOW (ref 12.0–15.0)
Lymphocytes Relative: 19 %
Lymphs Abs: 1.2 10*3/uL (ref 0.7–4.0)
MCH: 28.2 pg (ref 26.0–34.0)
MCHC: 33.2 g/dL (ref 30.0–36.0)
MCV: 84.8 fL (ref 78.0–100.0)
Monocytes Absolute: 0.1 10*3/uL (ref 0.1–1.0)
Monocytes Relative: 1 %
Neutro Abs: 4.4 10*3/uL (ref 1.7–7.7)
Neutrophils Relative %: 72 %
Platelets: 163 10*3/uL (ref 150–400)
RBC: 4.15 MIL/uL (ref 3.87–5.11)
RDW: 13.2 % (ref 11.5–15.5)
WBC Morphology: INCREASED
WBC: 6.2 10*3/uL (ref 4.0–10.5)

## 2018-02-23 LAB — COMPREHENSIVE METABOLIC PANEL
ALT: 182 U/L — ABNORMAL HIGH (ref 0–44)
AST: 208 U/L — ABNORMAL HIGH (ref 15–41)
Albumin: 3.5 g/dL (ref 3.5–5.0)
Alkaline Phosphatase: 49 U/L (ref 38–126)
Anion gap: 15 (ref 5–15)
BUN: 7 mg/dL (ref 6–20)
CO2: 16 mmol/L — ABNORMAL LOW (ref 22–32)
Calcium: 8.6 mg/dL — ABNORMAL LOW (ref 8.9–10.3)
Chloride: 104 mmol/L (ref 98–111)
Creatinine, Ser: 1.1 mg/dL — ABNORMAL HIGH (ref 0.44–1.00)
GFR calc Af Amer: >60 mL/min (ref >60)
GFR calc non Af Amer: >60 mL/min (ref >60)
Glucose, Bld: 133 mg/dL — ABNORMAL HIGH (ref 70–99)
Potassium: 3.6 mmol/L (ref 3.5–5.1)
Sodium: 135 mmol/L (ref 135–145)
Total Bilirubin: 0.6 mg/dL (ref 0.3–1.2)
Total Protein: 7.2 g/dL (ref 6.5–8.1)

## 2018-02-23 LAB — I-STAT BETA HCG BLOOD, ED (MC, WL, AP ONLY): I-stat hCG, quantitative: <5 m[IU]/mL (ref <5)

## 2018-02-23 LAB — ACETAMINOPHEN LEVEL: Acetaminophen (Tylenol), Serum: 16 ug/mL (ref 10–30)

## 2018-02-23 LAB — I-STAT CG4 LACTIC ACID, ED: Lactic Acid, Venous: 1.85 mmol/L (ref 0.5–1.9)

## 2018-02-23 MED ORDER — CLINDAMYCIN PHOSPHATE 600 MG/50ML IV SOLN
600.0000 mg | Freq: Once | INTRAVENOUS | Status: AC
  Administered 2018-02-23: 600 mg via INTRAVENOUS
  Filled 2018-02-23: qty 50

## 2018-02-23 MED ORDER — KETOROLAC TROMETHAMINE 30 MG/ML IJ SOLN
30.0000 mg | Freq: Once | INTRAMUSCULAR | Status: AC
  Administered 2018-02-23: 30 mg via INTRAVENOUS
  Filled 2018-02-23: qty 1

## 2018-02-23 MED ORDER — LIDOCAINE-EPINEPHRINE (PF) 2 %-1:200000 IJ SOLN
10.0000 mL | Freq: Once | INTRAMUSCULAR | Status: DC
  Filled 2018-02-23: qty 20

## 2018-02-23 MED ORDER — TRAMADOL HCL 50 MG PO TABS: 50.0000 mg | ORAL_TABLET | Freq: Four times a day (QID) | ORAL | 0 refills | Status: DC | PRN

## 2018-02-23 MED ORDER — OXYCODONE-ACETAMINOPHEN 5-325 MG PO TABS
1.0000 | ORAL_TABLET | Freq: Once | ORAL | Status: AC
  Administered 2018-02-23: 1 via ORAL
  Filled 2018-02-23: qty 1

## 2018-02-23 MED ORDER — SODIUM CHLORIDE 0.9 % IV BOLUS
1000.0000 mL | Freq: Once | INTRAVENOUS | Status: AC
  Administered 2018-02-23: 1000 mL via INTRAVENOUS

## 2018-02-23 NOTE — ED Provider Notes (Signed)
[facility] DEPART[facility] Provider Note   CSN: [id number] Arrival date & time: 02/23/18  0555     History   Chief Complaint Chief Complaint  Patient presents with  . Abscess  . Fever    HPI Nicole Alexander is a 42 y.o. female.  HPI Nicole Alexander is a 42 y.o. female presents to ED with complaint of left axillary abscess. States abscess was I&Ded a week ago. Was started on bactrim at that time. Went back for recheck 2 days ago, due to persistent fever and drainage from abscess. Clindamycin was added.  Abscess continues to drain. States she continues to have fever at home. Last fever was this morning   At 4 am of 101.4. Took 500mg  of tylenol and 600mg  of ibuprofen. Denies any other infectious symptoms. States blood work including blood cultures obtained by PCP two days ago.   Past Medical History:  Diagnosis Date  . Cervical lymphadenitis 11/25/2015    Patient Active Problem List   Diagnosis Date Noted  . Cervical lymphadenitis 11/25/2015  . Cervical lymphadenopathy   . FUO (fever of unknown origin)   . Rheumatoid factor positive   . Acute cervical adenitis 11/10/2015  . Neck pain on right side 11/10/2015  . Thyroid nodule 11/10/2015  . Anemia 11/10/2015  . Vaginitis 09/10/2013  . UTI (urinary tract infection) 08/27/2013  . Neck pain on left side 08/27/2013  . Elbow pain, right 08/27/2013  . Streptococcal bacteremia 08/27/2013  . 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 Sepsis ([place]) 08/25/2013  . CAP (community acquired pneumonia) 08/25/2013  . AKI (acute kidney injury) ([place]) 08/25/2013  . Dehydration with hyponatremia 08/25/2013  . High anion gap metabolic acidosis 40/97/3532  . Leukocytosis 08/25/2013    Past Surgical History:  Procedure Laterality Date  . BREAST SURGERY     breast reduction  . CHOLECYSTECTOMY N/A 11/28/2012   Procedure: LAPAROSCOPIC CHOLECYSTECTOMY WITH INTRAOPERATIVE CHOLANGIOGRAM;  Surgeon: Zenovia Jarred, MD;   Location: [facility];  Service: General;  Laterality: N/A;     OB History   None      Home Medications    Prior to Admission medications   Medication Sig Start Date End Date Taking? Authorizing Provider  acetaminophen (TYLENOL) 500 MG tablet Take 1,000 mg by mouth every 6 (six) hours as needed for mild pain or fever.   Yes [provider]  BIOTIN PO Take 1 capsule by mouth daily.   Yes [provider]  clindamycin (CLEOCIN) 300 MG capsule Take 2 capsules (600 mg total) by mouth every 8 (eight) hours. 11/25/15  Yes Tommy Medal, Lavell Islam, MD  folic acid (FOLVITE) 1 MG tablet Take 1 mg by mouth daily. 02/16/18  Yes [provider]  ibuprofen (ADVIL,MOTRIN) 200 MG tablet Take 600 mg by mouth every 6 (six) hours as needed for fever or mild pain.   Yes [provider]  sulfamethoxazole-trimethoprim (BACTRIM DS,SEPTRA DS) 800-160 MG tablet Take 1 tablet by mouth 2 (two) times daily. 02/16/18  Yes [provider]  docusate sodium (COLACE) 100 MG capsule Take 1 capsule (100 mg total) by mouth daily. Patient not taking: Reported on 02/23/2018 11/15/15   Sid Falcon, MD  ferrous sulfate 325 (65 FE) MG tablet Take 1 tablet (325 mg total) by mouth daily with breakfast. Patient not taking: Reported on 02/23/2018 11/15/15   Sid Falcon, MD  oxyCODONE-acetaminophen (PERCOCET/ROXICET) 5-325 MG tablet Take 1 tablet by mouth every 4 (four) hours as needed for  moderate pain or severe pain. Patient not taking: Reported on 02/23/2018 11/15/15   Sid Falcon, MD    Family History Family History  Problem Relation Age of Onset  . Heart disease Mother        PTCA/stent  . Hypertension Mother   . Diabetes Mother   . Hyperlipidemia Mother   . Cancer Father        lung  . Hypertension Father   . Diabetes Father   . Hyperlipidemia Father   . Hypertension Brother   . Cancer Maternal Grandfather        bone    Social History Social History   Tobacco Use  . Smoking  status: Former Smoker    Packs/day: 0.50    Years: 4.00    Pack years: 2.00  . Smokeless tobacco: Never Used  Substance Use Topics  . Alcohol use: Yes    Comment: occasional  . Drug use: No     Allergies   Penicillins   Review of Systems Review of Systems  Constitutional: Negative for chills and fever.  Respiratory: Negative for cough, chest tightness and shortness of breath.   Cardiovascular: Negative for chest pain, palpitations and leg swelling.  Gastrointestinal: Negative for abdominal pain, diarrhea, nausea and vomiting.  Musculoskeletal: Positive for arthralgias. Negative for myalgias, neck pain and neck stiffness.  Skin: Positive for color change and wound. Negative for rash.  Neurological: Negative for dizziness, weakness and headaches.  All other systems reviewed and are negative.    Physical Exam Updated Vital Signs BP 125/67   Pulse (!) 108   Temp 99.4 F (37.4 C) (Oral)   Resp (!) 24   SpO2 100%   Physical Exam  Constitutional: She appears well-developed and well-nourished. No distress.  Eyes: Conjunctivae are normal.  Neck: Neck supple.  Neurological: She is alert.  Skin: Skin is warm and dry.  Area of induration, swelling, erythema to the left axilla. Healing incision over the area, with only punctate opening left draining purulent drainage upon applying pressure to the area. There is surround erythema and ttp   Nursing note and vitals reviewed.    ED Treatments / Results  Labs (all labs ordered are listed, but only abnormal results are displayed) Labs Reviewed  COMPREHENSIVE METABOLIC PANEL  CBC WITH DIFFERENTIAL/PLATELET  I-STAT CG4 LACTIC ACID, ED  I-STAT BETA HCG BLOOD, ED ([facility], [facility], [facility] ONLY)    EKG None  Radiology No results found.  Procedures .Marland KitchenIncision and Drainage Date/Time: 02/23/2018 8:49 AM Performed by: Jeannett Senior, PA-C Authorized by: Jeannett Senior, PA-C   Consent:    Consent obtained:  Verbal   Consent  given by:  Patient   Risks discussed:  Bleeding, incomplete drainage, pain and damage to other organs   Alternatives discussed:  No treatment Universal protocol:    Procedure explained and questions answered to patient or proxy's satisfaction: yes     Relevant documents present and verified: yes     Test results available and properly labeled: yes     Imaging studies available: yes     Required blood products, implants, devices, and special equipment available: yes     Site/side marked: yes     Immediately prior to procedure a time out was called: yes     Patient identity confirmed:  Verbally with patient Location:    Type:  Abscess Pre-procedure details:    Skin preparation:  Betadine Anesthesia (see MAR for exact dosages):    Anesthesia method:  Local  infiltration   Local anesthetic:  Lidocaine 1% WITH epi and lidocaine 2% WITH epi Procedure type:    Complexity:  Complex Procedure details:    Incision types:  Single straight   Incision depth:  Subcutaneous   Scalpel blade:  11   Wound management:  Probed and deloculated, irrigated with saline and extensive cleaning   Drainage:  Purulent   Drainage amount:  Moderate   Packing materials:  1/4 in gauze Post-procedure details:    Patient tolerance of procedure:  Tolerated well, no immediate complications   (including critical care time)  Medications Ordered in ED Medications - No data to display   Initial Impression / Assessment and Plan / ED Course  I have reviewed the triage vital signs and the nursing notes.  Pertinent labs & imaging results that were available during my care of the patient were reviewed by me and considered in my medical decision making (see chart for details).     Pt with persistent abscess to the left axialla with mild surrounding cellulitis. Has had I&D 1 week ago, but the incision seemed to close with reaccumulation of fluid. We will re open the abscess, irrigate, possibly pack.  Patient is  tachycardic here, however is afebrile.  Just had Tylenol and Motrin at home.  States that they did blood cultures 2 days ago and would like not to have them done here at this time.  We will give her IV fluids, pain medications, and reassess her vital signs after.   Abscess I&Ded. HR improved with fluids and pain medications. Normal WBC and lactic. Home with wound care and continue antibiotics. LFTs elevated, checked tylenol level and is fine. Pt does not drink alcohol. Has had gallbladder out. Will have her recheck LFTs in week and follow up for further evaluation. Pt agreed. Stable for dc home.   Tramadol for pain.   Vitals:   02/23/18 0815 02/23/18 0845 02/23/18 0930 02/23/18 1045  BP: 120/74 (!) 119/94 106/66 105/71  Pulse: (!) 102 (!) 110 99 (!) 102  Resp: 15 17    Temp:      TempSrc:      SpO2: 100% 100% 100% 99%    Final Clinical Impressions(s) / ED Diagnoses   Final diagnoses:  Abscess of left axilla  Elevated LFTs    ED Discharge Orders         Ordered    traMADol (ULTRAM) 50 MG tablet  Every 6 hours PRN     02/23/18 1055           Jeannett Senior, PA-C 02/23/18 1614    Virgel Manifold, MD 02/25/18 1056

## 2018-02-23 NOTE — ED Triage Notes (Signed)
Pt has a boil under her arm lanced last week, since then she has had fevers and felt poorly. Pt reports 101.4 fever at 4am, took tylenol and ibuprofen however she continues to feel poorly.  Boil looks red and inflamed.

## 2018-02-23 NOTE — Discharge Instructions (Signed)
Warm compresses to the left axilla several times a day. Follow up with your doctor in 2 days to remove packing and for recheck. Continue clindamycin. Take ibuprofen for pain. Tramadol for severe pain. Avoid tylenol. Your liver function is elevated today, please repeat labs with your PCP in 1 week.

## 2018-02-24 DIAGNOSIS — L02413 Cutaneous abscess of right upper limb: Secondary | ICD-10-CM | POA: Diagnosis not present

## 2018-02-24 DIAGNOSIS — Z79899 Other long term (current) drug therapy: Secondary | ICD-10-CM | POA: Diagnosis not present

## 2018-02-24 DIAGNOSIS — R945 Abnormal results of liver function studies: Secondary | ICD-10-CM | POA: Diagnosis not present

## 2018-02-24 DIAGNOSIS — A419 Sepsis, unspecified organism: Secondary | ICD-10-CM | POA: Diagnosis not present

## 2018-02-24 DIAGNOSIS — L02411 Cutaneous abscess of right axilla: Secondary | ICD-10-CM | POA: Diagnosis not present

## 2018-02-24 DIAGNOSIS — L02412 Cutaneous abscess of left axilla: Secondary | ICD-10-CM | POA: Diagnosis not present

## 2018-02-24 DIAGNOSIS — R911 Solitary pulmonary nodule: Secondary | ICD-10-CM | POA: Diagnosis not present

## 2018-03-05 DIAGNOSIS — R740 Nonspecific elevation of levels of transaminase and lactic acid dehydrogenase [LDH]: Secondary | ICD-10-CM | POA: Diagnosis not present

## 2018-03-05 DIAGNOSIS — R945 Abnormal results of liver function studies: Secondary | ICD-10-CM | POA: Diagnosis not present

## 2018-03-05 DIAGNOSIS — R7309 Other abnormal glucose: Secondary | ICD-10-CM | POA: Diagnosis not present

## 2018-03-07 ENCOUNTER — Other Ambulatory Visit: Payer: Self-pay | Admitting: Internal Medicine

## 2018-03-07 DIAGNOSIS — R935 Abnormal findings on diagnostic imaging of other abdominal regions, including retroperitoneum: Secondary | ICD-10-CM | POA: Diagnosis not present

## 2018-03-07 DIAGNOSIS — L02412 Cutaneous abscess of left axilla: Secondary | ICD-10-CM | POA: Diagnosis not present

## 2018-03-07 DIAGNOSIS — G4489 Other headache syndrome: Secondary | ICD-10-CM | POA: Diagnosis not present

## 2018-03-07 DIAGNOSIS — R2232 Localized swelling, mass and lump, left upper limb: Secondary | ICD-10-CM

## 2018-03-07 DIAGNOSIS — G4452 New daily persistent headache (NDPH): Secondary | ICD-10-CM

## 2018-03-09 ENCOUNTER — Other Ambulatory Visit: Payer: Self-pay | Admitting: Internal Medicine

## 2018-03-09 ENCOUNTER — Ambulatory Visit
Admission: RE | Admit: 2018-03-09 | Discharge: 2018-03-09 | Disposition: A | Discharge status: Home / Self Care | Payer: 59 | Source: Ambulatory Visit | Attending: Internal Medicine | Admitting: Internal Medicine

## 2018-03-09 DIAGNOSIS — R42 Dizziness and giddiness: Secondary | ICD-10-CM | POA: Diagnosis not present

## 2018-03-09 DIAGNOSIS — G4489 Other headache syndrome: Secondary | ICD-10-CM

## 2018-03-12 ENCOUNTER — Encounter: Payer: Self-pay | Admitting: Infectious Diseases

## 2018-03-12 DIAGNOSIS — R911 Solitary pulmonary nodule: Secondary | ICD-10-CM | POA: Diagnosis not present

## 2018-03-12 DIAGNOSIS — R740 Nonspecific elevation of levels of transaminase and lactic acid dehydrogenase [LDH]: Secondary | ICD-10-CM | POA: Diagnosis not present

## 2018-03-12 DIAGNOSIS — R935 Abnormal findings on diagnostic imaging of other abdominal regions, including retroperitoneum: Secondary | ICD-10-CM | POA: Diagnosis not present

## 2018-03-13 ENCOUNTER — Encounter: Payer: Self-pay | Admitting: Infectious Diseases

## 2018-03-13 ENCOUNTER — Ambulatory Visit (INDEPENDENT_AMBULATORY_CARE_PROVIDER_SITE_OTHER): Payer: 59 | Admitting: Infectious Diseases

## 2018-03-13 VITALS — BP 129/91 | HR 95 | Temp 98.3°F | Wt 248.8 lb

## 2018-03-13 DIAGNOSIS — R740 Nonspecific elevation of levels of transaminase and lactic acid dehydrogenase [LDH]: Secondary | ICD-10-CM

## 2018-03-13 DIAGNOSIS — D72820 Lymphocytosis (symptomatic): Secondary | ICD-10-CM

## 2018-03-13 DIAGNOSIS — R7401 Elevation of levels of liver transaminase levels: Secondary | ICD-10-CM

## 2018-03-13 NOTE — Patient Instructions (Signed)
Although rare I think what caused your hepatitis was the Bactrim. There is no other alternative explanation at this time I can find in looking through your work up.   Would not recommend further pursuit of cause and only periodically monitor for relapse in symptoms (fever, feeling poorly/weak, diarrhea, yellow eyes, bleeding, abdominal pain, diarrhea)   Nice to meet you!

## 2018-03-13 NOTE — Progress Notes (Signed)
Patient: Nicole Alexander  DOB: [birth year] MRN: [id number] PCP: Lanice Shirts, MD  Referring Provider: Lanice Shirts, MD    Patient Active Problem List   Diagnosis Date Noted  . Solitary pulmonary nodule on lung CT 03/28/2018  . Transaminitis 03/28/2018  . Cervical lymphadenitis 11/25/2015  . Cervical lymphadenopathy   . Rheumatoid factor positive   . Thyroid nodule 11/10/2015  . Anemia 11/10/2015  . Elbow pain, right 08/27/2013  . 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 Leukocytosis 08/25/2013     Subjective:  No chief complaint on file.   Nicole Alexander is a 42 y.o. woman here to evaluate unexplained transaminitis.   She feels well today and describes that she has had several incidents that have been "mysterious" with regards to her health and infections. She first learned of her liver function being abnormal when she was getting ready to leave hospital in [place] [facility]) 02-28-2018. On August 9th she presented to the ER for a boil under her left arm and fever. She tells me that this lump had been present for 3 years. At the end of July 2019 it became more painful and she was referred to dermatology. Was given antibiotics alone to which she had no benefit from. This was eventually drained and packed by way of Dermatology team (~end of July) and given course of trimethoprim-sulfamethoxazole; at wound check a few days later continued running a fever - clindamycin was added and bactrim continued from what I understand; continued to drain and persist with temps > 101 F.   In the ER on 8/09 she was described to have surrounding cellulitis of the previously incised site with re-accumulation of fluid; Afebrile but tachycardic at visit. HR improved with IVF and pain control. WBC normal and normal lactic acid however LFTs elevated at this time and recommended to repeat these in 1 week with PCP. Following this she traveled to [place] with family for planned trip and  again felt worse - developed fevers to 102 F, blotchy red rash to her face and arms, vomiting and diarrhea w/o abdominal pain. Presented to [facility] in [place]. She had headaches mildly during this time frame. No tick or insect exposure history. She does recall taking "a lot of tylenol and ibuprofen" for pain and fevers. LFTs remained elevated (AST 478 ALT 420 APhos 67, TBili 0.2) - Abd CT done revealing no suspicious hepatic lesions or biliary duct dilation; hepatic steatosis; multiple borderline prominent L axillary LNs; R adnexum 4 x 2 cm hypodensity >>cyst?. BCx were drawn and remained sterile after 5 days; thrombocytopenia (103K), WBC normal with normal ANC (4500) at initial presentation that dropped to 700 on hospital day 5 with lymphocytes up to 67% (normal at admit). Acute hepatitis panel negative. Tylenol level < 15. She was given empiric vancomycin and cefepime. And discharged on 8-14.   Since this admission she has had follow up with her PCP and other viral serologies done in an effort to explain rise in LFTs. They have since normalized and she now feels fine and back to baseline. Denies any further rash, fever, chills, abdominal pain or changes to urine/stool/skin color.    Previous infectious History:  Referred to heme/onc for abnormal lymphocytosis (following with Dr. Lindi Adie). Previously seen in 2016 for leukocytosis and lymphadenopathy. Underwent flow cytometry (normal) and blood smear (normal). Attributed elevated values to infections. RF positive but other serologies negative.   Saw ENT Wilburn Cornelia) in 2017 - tonsillectomy performed 04/2016. Tissue  from resected area was benign. Healed well following surgery.   She has seen our team several times since 2014   10-2012 - FUO with fevers up to 130 F. R Cervical lymph node enlarged that improved on keflex but continued to have pain. Increased lymphocytes at that time. Considered multiple autoimmune and infectious viral  syndromes. EBV IgG was very elevated but EA and IgM (-). ANC noted to be 900 at that time.   08-2013 - streptococcal bacteremia in the setting of Lemierre's syndrome following L tonsillitis. Extended course with levaquin (6w) required as she was slow to improve clinically.   10-2015 - recurrent lymphadenitis R neck and swelling. CT with bulky suppurative adenitis w/o JV thrombosis. Pathology of needle-guided LN biopsy indicating necrotic process (necrotic vasculitis vs infection).   Review of Systems  All other systems reviewed and are negative.   Past Medical History:  Diagnosis Date  . Cervical lymphadenitis 11/25/2015    Outpatient Medications Prior to Visit  Medication Sig Dispense Refill  . folic acid (FOLVITE) 1 MG tablet Take 1 mg by mouth daily.  3  . acetaminophen (TYLENOL) 500 MG tablet Take 1,000 mg by mouth every 6 (six) hours as needed for mild pain or fever.    Marland Kitchen BIOTIN PO Take 1 capsule by mouth daily.    Marland Kitchen docusate sodium (COLACE) 100 MG capsule Take 1 capsule (100 mg total) by mouth daily. 10 capsule 0  . ferrous sulfate 325 (65 FE) MG tablet Take 1 tablet (325 mg total) by mouth daily with breakfast. 30 tablet 3  . ibuprofen (ADVIL,MOTRIN) 200 MG tablet Take 600 mg by mouth every 6 (six) hours as needed for fever or mild pain.    . clindamycin (CLEOCIN) 300 MG capsule Take 2 capsules (600 mg total) by mouth every 8 (eight) hours. (Patient not taking: Reported on 03/13/2018) 60 capsule 5  . oxyCODONE-acetaminophen (PERCOCET/ROXICET) 5-325 MG tablet Take 1 tablet by mouth every 4 (four) hours as needed for moderate pain or severe pain. (Patient not taking: Reported on 02/23/2018) 20 tablet 0  . sulfamethoxazole-trimethoprim (BACTRIM DS,SEPTRA DS) 800-160 MG tablet Take 1 tablet by mouth 2 (two) times daily.  1  . traMADol (ULTRAM) 50 MG tablet Take 1 tablet (50 mg total) by mouth every 6 (six) hours as needed. (Patient not taking: Reported on 03/13/2018) 15 tablet 0   No  facility-administered medications prior to visit.      Allergies  Allergen Reactions  . Penicillins Swelling    "swole up like a balloon" Has patient had a PCN reaction causing immediate rash, facial/tongue/throat swelling, SOB or lightheadedness with hypotension: YES Has patient had a PCN reaction causing severe rash involving mucus membranes or skin necrosis: NO Has patient had a PCN reaction that required hospitalization NO Has patient had a PCN reaction occurring within the last 10 years: NO If all of the above answers are "NO", then may proceed with Cephalosporin use.  . Sulfa Antibiotics     Social History   Tobacco Use  . Smoking status: Former Smoker    Packs/day: 0.50    Years: 4.00    Pack years: 2.00    Types: Cigarettes    Last attempt to quit: 07/18/1998    Years since quitting: 19.7  . Smokeless tobacco: Never Used  Substance Use Topics  . Alcohol use: Yes    Comment: occasional  . Drug use: No    Family History  Problem Relation Age of Onset  . Heart  disease Mother        PTCA/stent  . Hypertension Mother   . Diabetes Mother   . Hyperlipidemia Mother   . Cancer Father        lung  . Hypertension Father   . Diabetes Father   . Hyperlipidemia Father   . Hypertension Brother   . Cancer Maternal Grandfather        bone    Objective:   Vitals:   03/13/18 1510  BP: (!) 129/91  Pulse: 95  Temp: 98.3 F (36.8 C)  Weight: 248 lb 12.8 oz (112.9 kg)   Body mass index is 36.74 kg/m.  Physical Exam  Constitutional: She is oriented to person, place, and time. She appears well-developed and well-nourished.  Pleasant female seated comfortably in the chair.   HENT:  Mouth/Throat: Oropharynx is clear and moist. No oral lesions. No dental abscesses.  Eyes: Pupils are equal, round, and reactive to light. No scleral icterus.  Cardiovascular: Normal rate, regular rhythm and normal heart sounds.  Pulmonary/Chest: Effort normal and breath sounds normal.    Abdominal: Soft. She exhibits no distension. There is no tenderness.  Musculoskeletal: Normal range of motion. She exhibits no tenderness.  Lymphadenopathy:    She has no cervical adenopathy.  Neurological: She is alert and oriented to person, place, and time.  Skin: Skin is warm and dry. Capillary refill takes less than 2 seconds. No rash noted. No erythema.  Psychiatric: She has a normal mood and affect. Judgment normal.   Lab Results: Lab Results  Component Value Date   WBC 6.2 02/23/2018   HGB 11.7 (L) 02/23/2018   HCT 35.2 (L) 02/23/2018   MCV 84.8 02/23/2018   PLT 163 02/23/2018    Lab Results  Component Value Date   CREATININE 1.10 (H) 02/23/2018   BUN 7 02/23/2018   NA 135 02/23/2018   K 3.6 02/23/2018   CL 104 02/23/2018   CO2 16 (L) 02/23/2018    Lab Results  Component Value Date   ALT 182 (H) 02/23/2018   AST 208 (H) 02/23/2018   ALKPHOS 49 02/23/2018   BILITOT 0.6 02/23/2018     Assessment & Plan:   Problem List Items Addressed This Visit      Other   Transaminitis    Acute and unexplained elevation of AST/ALT with normal Alk Phos and TBili. Treated as sepsis syndrome during hospitalization and no culprit identified. All acute hepatitis serologies negative, viral studies indicating previous EBV/CMV exposure without evidence of reactivation in reviewing labs with her primary care provider. She is well today without fevers/chills/n/v/d and last I see her liver function tests have normalized.   Although it is rare there have been documented cases of drug induced hepatitis related to trimethoprim-sulfamethoxazole; this was a new drug that she took for a period of time prior to and through hospitalization where her LFTs/fevers and acute hepatitis episode peaked. No concern on imaging with regards to hepatic abscess. We discussed this today that this is my suspicion as to what occurred to explain this event. I have asked her to please call our office if she develops  any relapse of symptoms, but at this point would just routinely monitor liver function again in 4-8 weeks to ensure they remain normal. Hx of positive RF as well so alternatively would keep in mind she may have undiagnosed autoimmune component (autoimmune hepatitis) as well. If she has any recurrent episodes I would add ESR, immunoglobulin panel, ANA/ASMA/AAA Abs.   Would  add sulfa antibiotics to her allergy list considering she also had facial swelling/rash at presentation to the ER.   Happy to see her back to facilitate - immunology may be helpful considering her previous infection history (although her head/neck infections seem to have improved following tonsillectomy).       Leukocytosis - Primary    Following again with heme/onc Lindi Adie). Had reactive lymphocytosis again in the setting of possible infection or drug reaction. Previously her work up with flow cytometry and blood smear eval was normal.         I spent 60 minutes with the patient with 50% of the time spent in face to face discussion of the above with the remainder in review of her medical history and records from outside facilities.   Janene Madeira, MSN, NP-C [facility] Pager: [phone] Office: [phone]  03/28/18  3:53 PM

## 2018-03-15 ENCOUNTER — Telehealth: Payer: Self-pay | Admitting: Internal Medicine

## 2018-03-15 NOTE — Telephone Encounter (Signed)
Spoke with patient, patient requests to have her consult appt moved to Friday August 30th. Informed patient of the time slots available for tomorrow. Patient opted to keep her appt on September 10th. Nothing further needed at this time.

## 2018-03-16 ENCOUNTER — Other Ambulatory Visit: Payer: 59

## 2018-03-27 ENCOUNTER — Ambulatory Visit: Payer: 59 | Admitting: Internal Medicine

## 2018-03-27 ENCOUNTER — Encounter: Payer: Self-pay | Admitting: Internal Medicine

## 2018-03-27 VITALS — BP 118/80 | HR 85 | Ht 68.0 in | Wt 254.0 lb

## 2018-03-27 DIAGNOSIS — R590 Localized enlarged lymph nodes: Secondary | ICD-10-CM | POA: Diagnosis not present

## 2018-03-27 DIAGNOSIS — R911 Solitary pulmonary nodule: Secondary | ICD-10-CM | POA: Diagnosis not present

## 2018-03-27 DIAGNOSIS — Z23 Encounter for immunization: Secondary | ICD-10-CM

## 2018-03-27 NOTE — Patient Instructions (Signed)
We will contact you a year from your last CT chest to be sure the nodule gets follow up but nothing to do for this for now.  Dr Frederich Balding may need to get you seen by an immunologist = Dr Neldon Mc, if this pattern of infection remains unexplained but I would defer to her  Opinion and that of Dr Wendie Agreste

## 2018-03-27 NOTE — Progress Notes (Signed)
Nicole Alexander, female    DOB: 26-Feb-1976,   MRN: [id number]   Brief patient profile:  79 yobf quit smoking 2000 with pattern of recurrent head and neck infections as child = tonsilitis/ ear infection  Outgrew this pattern by 2006 resolved then in  2014 recurrent H/N infections including Lemiere's syndrome   And followed by ID/ ent and referred to pulmonary clinic 03/27/2018 by Dr Frederich Balding   For [facility]      03/27/2018  1st pulmonary nodules Chief Complaint  Patient presents with  . Pulmonary Consult    Referred by Dr. Coralyn Mark for eval of incidental pulmonary nodule. She denies having any respiratory co's.    Dyspnea:  Not limited by breathing from desired activities   Cough: none Sleep: sev pillows  SABA use: no   No obvious day to day or daytime variability or assoc excess/ purulent sputum or mucus plugs or hemoptysis or cp or chest tightness, subjective wheeze or overt sinus or hb symptoms.   Sleeping ok  without nocturnal  or early am exacerbation  of respiratory  c/o's or need for noct saba. Also denies any obvious fluctuation of symptoms with weather or environmental changes or other aggravating or alleviating factors except as outlined above   No unusual exposure hx or h/o childhood pna/ asthma or knowledge of premature birth.  Current Allergies, Complete Past Medical History, Past Surgical History, Family History, and Social History were reviewed in [employer] record.  ROS  The following are not active complaints unless bolded Hoarseness, sore throat/neck nodes , dysphagia, dental problems, itching, sneezing,  nasal congestion or discharge of excess mucus or purulent secretions, ear ache,   fever, chills, sweats, unintended wt loss or wt gain, classically pleuritic or exertional cp,  orthopnea pnd or arm/hand swelling  or leg swelling, presyncope, palpitations, abdominal pain, anorexia, nausea, vomiting, diarrhea  or change in bowel habits or change in  bladder habits, change in stools or change in urine, dysuria, hematuria,  rash, arthralgias, visual complaints, headache, numbness, weakness or ataxia or problems with walking or coordination,  change in mood or  memory.        Current Meds  Medication Sig  . cholecalciferol (VITAMIN D) 1000 units tablet Take 1,000 Units by mouth daily.  . folic acid (FOLVITE) 1 MG tablet Take 1 mg by mouth daily.  . Multiple Vitamin (MULTIVITAMIN) capsule Take 1 capsule by mouth daily.  . Prenatal Vit-DSS-Fe Cbn-FA (PRENATAL AD PO) Take 1 tablet by mouth daily.  . Probiotic Product (PROBIOTIC-10 PO) Take 1 capsule by mouth daily.      Past Medical History:  Diagnosis Date  . Cervical lymphadenitis 11/25/2015    Outpatient Medications Prior to Visit  Medication Sig Dispense Refill  . cholecalciferol (VITAMIN D) 1000 units tablet Take 1,000 Units by mouth daily.    . folic acid (FOLVITE) 1 MG tablet Take 1 mg by mouth daily.  3  . Multiple Vitamin (MULTIVITAMIN) capsule Take 1 capsule by mouth daily.    . Prenatal Vit-DSS-Fe Cbn-FA (PRENATAL AD PO) Take 1 tablet by mouth daily.    . Probiotic Product (PROBIOTIC-10 PO) Take 1 capsule by mouth daily.    Marland Kitchen acetaminophen (TYLENOL) 500 MG tablet Take 1,000 mg by mouth every 6 (six) hours as needed for mild pain or fever.    Marland Kitchen BIOTIN PO Take 1 capsule by mouth daily.    . clindamycin (CLEOCIN) 300 MG capsule Take 2 capsules (600 mg total) by  mouth every 8 (eight) hours. (Patient not taking: Reported on 03/13/2018) 60 capsule 5  . docusate sodium (COLACE) 100 MG capsule Take 1 capsule (100 mg total) by mouth daily. 10 capsule 0  . ferrous sulfate 325 (65 FE) MG tablet Take 1 tablet (325 mg total) by mouth daily with breakfast. 30 tablet 3  . ibuprofen (ADVIL,MOTRIN) 200 MG tablet Take 600 mg by mouth every 6 (six) hours as needed for fever or mild pain.    Marland Kitchen oxyCODONE-acetaminophen (PERCOCET/ROXICET) 5-325 MG tablet Take 1 tablet by mouth every 4 (four) hours as  needed for moderate pain or severe pain. (Patient not taking: Reported on 02/23/2018) 20 tablet 0  . sulfamethoxazole-trimethoprim (BACTRIM DS,SEPTRA DS) 800-160 MG tablet Take 1 tablet by mouth 2 (two) times daily.  1  . traMADol (ULTRAM) 50 MG tablet Take 1 tablet (50 mg total) by mouth every 6 (six) hours as needed. (Patient not taking: Reported on 03/13/2018) 15 tablet 0   No facility-administered medications prior to visit.             Objective:     BP 118/80 (BP Location: Left Arm, Cuff Size: Normal)   Pulse 85   Ht 5\' 8"  (1.727 m)   Wt 254 lb (115.2 kg)   SpO2 99%   BMI 38.62 kg/m   SpO2: 99 % Ra  Pleasant mod obese amb bf nad   HEENT: nl dentition, turbinates bilaterally, and oropharynx. Nl external ear canals without cough reflex   NECK :  without JVD/ TM/ nl carotid upstrokes bilaterally/  Mildly enlarged and tender ant cx/paratracheal nodes bilaterally    LUNGS: no acc muscle use,  Nl contour chest which is clear to A and P bilaterally without cough on insp or exp maneuvers   CV:  RRR  no s3 or murmur or increase in P2, and no edema   ABD:  soft and nontender with nl inspiratory excursion in the supine position. No bruits or organomegaly appreciated, bowel sounds nl  MS:  Nl gait/ ext warm without deformities, calf tenderness, cyanosis or clubbing No obvious joint restrictions   SKIN: warm and dry without lesions    NEURO:  alert, approp, nl sensorium with  no motor or cerebellar deficits apparent.    CT chest w contrast 02/24/18 [facility]/ [place]: 6 mm RLL spn      Assessment   No problem-specific Assessment & Plan notes found for this encounter.     Christinia Gully, MD 03/27/2018

## 2018-03-28 ENCOUNTER — Encounter: Payer: Self-pay | Admitting: Internal Medicine

## 2018-03-28 DIAGNOSIS — R7401 Elevation of levels of liver transaminase levels: Secondary | ICD-10-CM | POA: Insufficient documentation

## 2018-03-28 DIAGNOSIS — R911 Solitary pulmonary nodule: Secondary | ICD-10-CM | POA: Insufficient documentation

## 2018-03-28 DIAGNOSIS — R740 Nonspecific elevation of levels of transaminase and lactic acid dehydrogenase [LDH]: Principal | ICD-10-CM

## 2018-03-28 NOTE — Assessment & Plan Note (Addendum)
Acute and unexplained elevation of AST/ALT with normal Alk Phos and TBili. Treated as sepsis syndrome during hospitalization and no culprit identified. All acute hepatitis serologies negative, viral studies indicating previous EBV/CMV exposure without evidence of reactivation in reviewing labs with her primary care provider. She is well today without fevers/chills/n/v/d and last I see her liver function tests have normalized.   Although it is rare there have been documented cases of drug induced hepatitis related to trimethoprim-sulfamethoxazole; this was a new drug that she took for a period of time prior to and through hospitalization where her LFTs/fevers and acute hepatitis episode peaked. No concern on imaging with regards to hepatic abscess. We discussed this today that this is my suspicion as to what occurred to explain this event. I have asked her to please call our office if she develops any relapse of symptoms, but at this point would just routinely monitor liver function again in 4-8 weeks to ensure they remain normal. Hx of positive RF as well so alternatively would keep in mind she may have undiagnosed autoimmune component (autoimmune hepatitis) as well. If she has any recurrent episodes I would add ESR, immunoglobulin panel, ANA/ASMA/AAA Abs.   Would add sulfa antibiotics to her allergy list considering she also had facial swelling/rash at presentation to the ER.   Happy to see her back to facilitate - immunology may be helpful considering her previous infection history (although her head/neck infections seem to have improved following tonsillectomy).

## 2018-03-28 NOTE — Assessment & Plan Note (Addendum)
Risk facor = quit smoking 2000 - not likely related to her recurrent H/N infections with sepsis but hard to rule out this was not a septic embolism from past severe infections.   CT results reviewed with pt >>> Too small for PET or bx, not suspicious enough for excisional bx > really only option for now is follow the Fleischner society guidelines as rec by radiology = f/u non contrasted CT due 02/25/19 and placed in our reminder file to be sure it is done.  Can be done sooner if any suspicious symptoms develop :  Cough / pleuritic cp/ FUO   Discussed in detail all the  indications, usual  risks and alternatives  relative to the benefits with patient who agrees to proceed with conservative f/u as outlined

## 2018-03-28 NOTE — Assessment & Plan Note (Signed)
Following again with heme/onc Nicole Alexander). Had reactive lymphocytosis again in the setting of possible infection or drug reaction. Previously her work up with flow cytometry and blood smear eval was normal.

## 2018-03-28 NOTE — Assessment & Plan Note (Signed)
Minimally tender, enlarged on exam and worrisome pattern of recurrent severe head and neck infection ? Needs immunology eval if never done to be be sure does not have immune deficiency > would consider referring her to Dr Neldon Mc    Total time devoted to counseling  > 50 % of initial 60 min office visit:  review extensive case hx with pt/ discussion of options/alternatives/ personally creating written customized instructions  in presence of pt  then going over those specific  Instructions directly with the pt including how to use all of the meds but in particular covering each new medication in detail and the difference between the maintenance= "automatic" meds and the prns using an action plan format for the latter (If this problem/symptom => do that organization reading Left to right).  Please see AVS from this visit for a full list of these instructions which I personally wrote for this pt and  are unique to this visit.

## 2018-04-09 ENCOUNTER — Ambulatory Visit
Admission: RE | Admit: 2018-04-09 | Discharge: 2018-04-09 | Disposition: A | Discharge status: Home / Self Care | Payer: 59 | Source: Ambulatory Visit | Attending: Internal Medicine | Admitting: Internal Medicine

## 2018-04-09 ENCOUNTER — Other Ambulatory Visit: Payer: Self-pay | Admitting: Internal Medicine

## 2018-04-09 DIAGNOSIS — M7989 Other specified soft tissue disorders: Secondary | ICD-10-CM | POA: Diagnosis not present

## 2018-04-09 DIAGNOSIS — R2231 Localized swelling, mass and lump, right upper limb: Secondary | ICD-10-CM | POA: Diagnosis not present

## 2018-04-09 DIAGNOSIS — D473 Essential (hemorrhagic) thrombocythemia: Secondary | ICD-10-CM | POA: Diagnosis not present

## 2018-04-09 DIAGNOSIS — R911 Solitary pulmonary nodule: Secondary | ICD-10-CM | POA: Diagnosis not present

## 2018-04-18 DIAGNOSIS — I82890 Acute embolism and thrombosis of other specified veins: Secondary | ICD-10-CM | POA: Diagnosis not present

## 2018-04-18 DIAGNOSIS — M778 Other enthesopathies, not elsewhere classified: Secondary | ICD-10-CM | POA: Diagnosis not present

## 2018-07-17 DIAGNOSIS — D72828 Other elevated white blood cell count: Secondary | ICD-10-CM | POA: Diagnosis not present

## 2018-07-17 DIAGNOSIS — R740 Nonspecific elevation of levels of transaminase and lactic acid dehydrogenase [LDH]: Secondary | ICD-10-CM | POA: Diagnosis not present

## 2018-08-11 ENCOUNTER — Inpatient Hospital Stay (HOSPITAL_COMMUNITY)
Admission: AD | Admit: 2018-08-11 | Discharge: 2018-08-11 | Disposition: A | Discharge status: Home / Self Care | Payer: 59 | Attending: Obstetrics and Gynecology | Admitting: Obstetrics and Gynecology

## 2018-08-11 ENCOUNTER — Inpatient Hospital Stay (HOSPITAL_COMMUNITY): Payer: 59

## 2018-08-11 ENCOUNTER — Other Ambulatory Visit: Payer: Self-pay

## 2018-08-11 ENCOUNTER — Encounter (HOSPITAL_COMMUNITY): Payer: Self-pay | Admitting: *Deleted

## 2018-08-11 DIAGNOSIS — O00101 Right tubal pregnancy without intrauterine pregnancy: Secondary | ICD-10-CM

## 2018-08-11 DIAGNOSIS — N939 Abnormal uterine and vaginal bleeding, unspecified: Secondary | ICD-10-CM | POA: Diagnosis not present

## 2018-08-11 DIAGNOSIS — Z880 Allergy status to penicillin: Secondary | ICD-10-CM | POA: Insufficient documentation

## 2018-08-11 DIAGNOSIS — Z87891 Personal history of nicotine dependence: Secondary | ICD-10-CM | POA: Diagnosis not present

## 2018-08-11 DIAGNOSIS — D259 Leiomyoma of uterus, unspecified: Secondary | ICD-10-CM | POA: Diagnosis not present

## 2018-08-11 LAB — CBC WITH DIFFERENTIAL/PLATELET
Basophils Absolute: 0 10*3/uL (ref 0.0–0.1)
Basophils Relative: 0 %
Eosinophils Absolute: 0.5 10*3/uL (ref 0.0–0.5)
Eosinophils Relative: 4 %
HCT: 33.5 % — ABNORMAL LOW (ref 36.0–46.0)
Hemoglobin: 11 g/dL — ABNORMAL LOW (ref 12.0–15.0)
Lymphocytes Relative: 27 %
Lymphs Abs: 3.4 10*3/uL (ref 0.7–4.0)
MCH: 28.4 pg (ref 26.0–34.0)
MCHC: 32.8 g/dL (ref 30.0–36.0)
MCV: 86.3 fL (ref 80.0–100.0)
Monocytes Absolute: 0.4 10*3/uL (ref 0.1–1.0)
Monocytes Relative: 3 %
Neutro Abs: 8.6 10*3/uL — ABNORMAL HIGH (ref 1.7–7.7)
Neutrophils Relative %: 66 %
Platelets: 288 10*3/uL (ref 150–400)
RBC: 3.88 MIL/uL (ref 3.87–5.11)
RDW: 14.1 % (ref 11.5–15.5)
WBC: 12.9 10*3/uL — AB (ref 4.0–10.5)
nRBC: 0 % (ref 0.0–0.2)

## 2018-08-11 LAB — CREATININE, SERUM
Creatinine, Ser: 0.57 mg/dL (ref 0.44–1.00)
GFR calc non Af Amer: >60 mL/min (ref >60)

## 2018-08-11 LAB — URINALYSIS, ROUTINE W REFLEX MICROSCOPIC
Bilirubin Urine: NEGATIVE
Glucose, UA: NEGATIVE mg/dL
Ketones, ur: NEGATIVE mg/dL
Leukocytes, UA: NEGATIVE
Nitrite: NEGATIVE
PH: 6 (ref 5.0–8.0)
Protein, ur: 30 mg/dL — AB
SPECIFIC GRAVITY, URINE: 1.017 (ref 1.005–1.030)

## 2018-08-11 LAB — AST: AST: 26 U/L (ref 15–41)

## 2018-08-11 LAB — HCG, QUANTITATIVE, PREGNANCY: HCG, BETA CHAIN, QUANT, S: 9038 m[IU]/mL — AB (ref <5)

## 2018-08-11 LAB — ABO/RH: ABO/RH(D): A POS

## 2018-08-11 LAB — POCT PREGNANCY, URINE: Preg Test, Ur: POSITIVE — AB

## 2018-08-11 LAB — BUN: BUN: 7 mg/dL (ref 6–20)

## 2018-08-11 MED ORDER — METHOTREXATE INJECTION FOR WOMEN'S HOSPITAL
50.0000 mg/m2 | Freq: Once | INTRAMUSCULAR | Status: AC
  Administered 2018-08-11: 120 mg via INTRAMUSCULAR
  Filled 2018-08-11: qty 2.4

## 2018-08-11 NOTE — MAU Provider Note (Signed)
History     Chief Complaint  Patient presents with  . Vaginal Bleeding  . Possible Pregnancy   43 yo G3P0020 MBF LMP 1/11 presents with c/o vaginal bleeding since onset of cycle . (+) UPT. Denies any abdominal pain or cramping,  Hx ectopic treated with MTX. No contraception   OB History    Gravida  2   Para      Term      Preterm      AB  1   Living  0     SAB      TAB      Ectopic  1   Multiple      Live Births              Past Medical History:  Diagnosis Date  . Cervical lymphadenitis 11/25/2015    Past Surgical History:  Procedure Laterality Date  . BREAST SURGERY     breast reduction  . CHOLECYSTECTOMY N/A 11/28/2012   Procedure: LAPAROSCOPIC CHOLECYSTECTOMY WITH INTRAOPERATIVE CHOLANGIOGRAM;  Surgeon: Zenovia Jarred, MD;  Location: [facility];  Service: General;  Laterality: N/A;  . TONSILLECTOMY      Family History  Problem Relation Age of Onset  . Heart disease Mother        PTCA/stent  . Hypertension Mother   . Diabetes Mother   . Hyperlipidemia Mother   . Cancer Father        lung  . Hypertension Father   . Diabetes Father   . Hyperlipidemia Father   . Hypertension Brother   . Cancer Maternal Grandfather        bone    Social History   Tobacco Use  . Smoking status: Former Smoker    Packs/day: 0.50    Years: 4.00    Pack years: 2.00    Types: Cigarettes    Last attempt to quit: 07/18/1998    Years since quitting: 20.0  . Smokeless tobacco: Never Used  Substance Use Topics  . Alcohol use: Not Currently    Comment: occasional  . Drug use: No    Allergies:  Allergies  Allergen Reactions  . Penicillins Swelling    "swole up like a balloon" Has patient had a PCN reaction causing immediate rash, facial/tongue/throat swelling, SOB or lightheadedness with hypotension: YES Has patient had a PCN reaction causing severe rash involving mucus membranes or skin necrosis: NO Has patient had a PCN reaction that required hospitalization  NO Has patient had a PCN reaction occurring within the last 10 years: NO If all of the above answers are "NO", then may proceed with Cephalosporin use.  . Sulfa Antibiotics     Medications Prior to Admission  Medication Sig Dispense Refill Last Dose  . cholecalciferol (VITAMIN D) 1000 units tablet Take 1,000 Units by mouth daily.   Taking  . folic acid (FOLVITE) 1 MG tablet Take 1 mg by mouth daily.  3 Taking  . Multiple Vitamin (MULTIVITAMIN) capsule Take 1 capsule by mouth daily.   Taking  . Prenatal Vit-DSS-Fe Cbn-FA (PRENATAL AD PO) Take 1 tablet by mouth daily.   Taking  . Probiotic Product (PROBIOTIC-10 PO) Take 1 capsule by mouth daily.   Taking     Physical Exam   Blood pressure 132/78, pulse 84, temperature (!) 97.5 F (36.4 C), temperature source Oral, resp. rate 18, weight 118 kg, last menstrual period 07/28/2018, SpO2 96 %.  General appearance: alert, cooperative and no distress Lungs: clear to auscultation bilaterally Heart:  regular rate and rhythm, S1, S2 normal, no murmur, click, rub or gallop Abdomen: obese soft nontender Pelvic: cervix normal in appearance, external genitalia normal and scant pink vaginal discharge. cervix closed, uterus irreg enlarged nontender, no adnexal mass Extremities: no edema, redness or tenderness in the calves or thighs ED Course  IMP: AUB (+) pregnancy test P) Hquant. Pelvic sonogram MDM  hquant: 9038. sono pending  Marvene Staff, MD 1:13 PM 08/11/2018   Addendum: US Ob Transvaginal  Result Date: 08/11/2018 CLINICAL DATA:  Bleeding during early pregnancy EXAM: TRANSVAGINAL OB ULTRASOUND TECHNIQUE: Transvaginal ultrasound was performed for complete evaluation of the gestation as well as the maternal uterus, adnexal regions, and pelvic cul-de-sac. COMPARISON:  None. FINDINGS: Intrauterine gestational sac: None Yolk sac:  Not visualized Embryo:  Not visualized Cardiac Activity: Not visualized Heart Rate: Not applicable bpm  Maternal uterus/adnexae: Subchorionic hemorrhage: Not applicable Right ovary: There is a large simple appearing anechoic cyst associated with the right ovary measuring 5 x 4.4 x 6.2 cm. Left ovary: Normal. Other :Within the right adnexa adjacent but separate from the right ovary there is a solid, hyperechoic structure with a single internal anechoic structure. This measures 3 x 2.3 x 1.7 cm and is concerning for ectopic pregnancy. Three uterine fibroids are noted. The largest is identified within the right side of uterus measuring 2.8 x 2.6 x 2.1 cm. Subserosal fibroid arising from the right anterior fundus measures 2.1 x 2.0 x 2.0 cm. Free fluid:  Trace free fluid. IMPRESSION: 1. No intrauterine gestation identified. 2. Solid echogenic mass with central anechoic structure is identified within the right adnexa adjacent to the right ovary. Suspicious for ectopic pregnancy. 3. Large right ovary cysts 4. Uterine fibroids. Electronically Signed   By: Kerby Moors M.D.   On: 08/11/2018 15:19  imp: right ectopic pregnancy Disc MTX vs surgery. Pro and con disc. Desires MTX. Will do labs( CBC, AST. Creatinine).  If nl, proceed with MTX based on height weight. Advised surgery would result in removal of the involved tube. Pt declines. Will return to office tues and Friday for repeat Hquant. Instructed to stop vitamins, avoid sun exposure, to call if abdominal pain occurs. Understands plan 2) incidental right ovarian cyst. F/u prn

## 2018-08-11 NOTE — Discharge Instructions (Signed)
Methotrexate Treatment for an Ectopic Pregnancy  Methotrexate is a medicine that treats an ectopic pregnancy. An ectopic pregnancy is a pregnancy in which the fetus develops outside the uterus. This kind of pregnancy can be dangerous. Methotrexate works by stopping the growth of the fertilized egg. It also helps your body absorb tissue from the egg. This takes between 2-6 weeks. Most ectopic pregnancies can be successfully treated with methotrexate if they are diagnosed early. Tell a health care provider about:  Any allergies you have.  All medicines you are taking, including vitamins, herbs, eye drops, creams, and over-the-counter medicines.  Any medical conditions you have. What are the risks? Generally, this is a safe treatment. However, problems may occur, including:  Nausea or vomiting or both.  Vaginal bleeding or spotting.  Diarrhea.  Abdominal cramping.  Dizziness or feeling lightheaded.  Mouth sores.  Swelling or irritation of the lining of your lungs (pneumonitis).  Liver damage.  Hair loss. There is a risk that methotrexate treatment will fail and your pregnancy will continue. There is also a risk that the ectopic pregnancy might rupture while you are using this medicine. What happens before the procedure?  Liver tests, kidney tests, and a complete blood test will be done.  Blood tests will be done to measure the pregnancy hormone levels and to determine your blood type.  If you are Rh-negative and the father is Rh-positive or his Rh type is not known, you will be given a Rho (D) immune globulin shot. What happens during the procedure? Your health care provider may give you methotrexate by injection or in the form of a pill. Methotrexate may be given as a single dose of medicine or a series of doses, depending on your response to the treatment.  Methotrexate injections will be given by your health care provider. This is the most common way that methotrexate is used  to treat an ectopic pregnancy.  If you are prescribed oral methotrexate, it is very important that you follow your health care provider's instructions on how to take oral methotrexate. Additional medicines may be needed to manage an ectopic pregnancy. The procedure may vary among health care providers and hospitals. What happens after the procedure?  You may have abdominal cramping, vaginal bleeding, and fatigue.  Blood tests will be taken at timed intervals for several days or weeks to check your pregnancy hormone levels. The blood tests will be done until the pregnancy hormone can no longer be detected in the blood.  You may need to have a surgical procedure to remove the ectopic pregnancy if methotrexate treatment fails.  Follow instructions from your health care provider on how and when to report any symptoms that may indicate a ruptured ectopic pregnancy. Summary  Methotrexate is a medicine that treats an ectopic pregnancy.  Methotrexate may be given in a single dose or a series of doses over time.  Blood tests will be taken at timed intervals for several days or weeks to check your pregnancy hormone levels. The blood tests will be done until no more pregnancy hormone is detected in the blood.  There is a risk that methotrexate treatment will fail and your pregnancy will continue. There is also a risk that the ectopic pregnancy might rupture while you are using this medicine. This information is not intended to replace advice given to you by your health care provider. Make sure you discuss any questions you have with your health care provider. Document Released: 06/28/2001 Document Revised: 08/23/2016 Document Reviewed:  08/23/2016 Elsevier Interactive Patient Education  2019 [employer].   Ectopic Pregnancy  An ectopic pregnancy is when the fertilized egg attaches (implants) outside the uterus. Most ectopic pregnancies occur in one of the tubes where eggs travel from the ovary to  the uterus (fallopian tubes), but the implanting can occur in other locations. In rare cases, ectopic pregnancies occur on the ovary, intestine, pelvis, abdomen, or cervix. In an ectopic pregnancy, the fertilized egg does not have the ability to develop into a normal, healthy baby. A ruptured ectopic pregnancy is one in which tearing or bursting of a fallopian tube causes internal bleeding. Often, there is intense lower abdominal pain, and vaginal bleeding sometimes occurs. Having an ectopic pregnancy can be life-threatening. If this dangerous condition is not treated, it can lead to blood loss, shock, or even death. What are the causes? The most common cause of this condition is damage to one of the fallopian tubes. A fallopian tube may be narrowed or blocked, and that keeps the fertilized egg from reaching the uterus. What increases the risk? This condition is more likely to develop in women of childbearing age who have different levels of risk. The levels of risk can be divided into three categories. High risk  You have gone through infertility treatment.  You have had an ectopic pregnancy before.  You have had surgery on the fallopian tubes, or another surgical procedure, such as an abortion.  You have had surgery to have the fallopian tubes tied (tubal ligation).  You have problems or diseases of the fallopian tubes.  You have been exposed to diethylstilbestrol (DES). This medicine was used until 1971, and it had effects on babies whose mothers took the medicine.  You become pregnant while using an IUD (intrauterine device) for birth control. Moderate risk  You have a history of infertility.  You have had an STI (sexually transmitted infection).  You have a history of pelvic inflammatory disease (PID).  You have scarring from endometriosis.  You have multiple sexual partners.  You smoke. Low risk  You have had pelvic surgery.  You use vaginal douches.  You became sexually  active before age 52. What are the signs or symptoms? Common symptoms of this condition include normal pregnancy symptoms, such as missing a period, nausea, tiredness, abdominal pain, breast tenderness, and bleeding. However, ectopic pregnancy will have additional symptoms, such as:  Pain with intercourse.  Irregular vaginal bleeding or spotting.  Cramping or pain on one side or in the lower abdomen.  Fast heartbeat, low blood pressure, and sweating.  Passing out while having a bowel movement. Symptoms of a ruptured ectopic pregnancy and internal bleeding may include:  Sudden, severe pain in the abdomen and pelvis.  Dizziness, weakness, light-headedness, or fainting.  Pain in the shoulder or neck area. How is this diagnosed? This condition is diagnosed by:  A pelvic exam to locate pain or a mass in the abdomen.  A pregnancy test. This blood test checks for the presence as well as the specific level of pregnancy hormone in the bloodstream.  Ultrasound. This is performed if a pregnancy test is positive. In this test, a probe is inserted into the vagina. The probe will detect a fetus, possibly in a location other than the uterus.  Taking a sample of uterus tissue (dilation and curettage, or D&C).  Surgery to perform a visual exam of the inside of the abdomen using a thin, lighted tube that has a tiny camera on the end (laparoscope).  Culdocentesis. This procedure involves inserting a needle at the top of the vagina, behind the uterus. If blood is present in this area, it may indicate that a fallopian tube is torn. How is this treated? This condition is treated with medicine or surgery. Medicine  An injection of a medicine (methotrexate) may be given to cause the pregnancy tissue to be absorbed. This medicine may save your fallopian tube. It may be given if: ? The diagnosis is made early, with no signs of active bleeding. ? The fallopian tube has not ruptured. ? You are  considered to be a good candidate for the medicine. Usually, pregnancy hormone blood levels are checked after methotrexate treatment. This is to be sure that the medicine is effective. It may take 4-6 weeks for the pregnancy to be absorbed. Most pregnancies will be absorbed by 3 weeks. Surgery  A laparoscope may be used to remove the pregnancy tissue.  If severe internal bleeding occurs, a larger cut (incision) may be made in the lower abdomen (laparotomy) to remove the fetus and placenta. This is done to stop the bleeding.  Part or all of the fallopian tube may be removed (salpingectomy) along with the fetus and placenta. The fallopian tube may also be repaired during the surgery.  In very rare circumstances, removal of the uterus (hysterectomy) may be required.  After surgery, pregnancy hormone testing may be done to be sure that there is no pregnancy tissue left. Whether your treatment is medicine or surgery, you may receive a Rho (D) immune globulin shot to prevent problems with any future pregnancy. This shot may be given if:  You are Rh-negative and the baby's father is Rh-positive.  You are Rh-negative and you do not know the Rh type of the baby's father. Follow these instructions at home:  Rest and limit your activity after the procedure for as long as told by your health care provider.  Until your health care provider says that it is safe: ? Do not lift anything that is heavier than 10 lb (4.5 kg), or the limit that your health care provider tells you. ? Avoid physical exercise and any movement that requires effort (is strenuous).  To help prevent constipation: ? Eat a healthy diet that includes fruits, vegetables, and whole grains. ? Drink 6-8 glasses of water per day. Get help right away if:  You develop worsening pain that is not relieved by medicine.  You have: ? A fever or chills. ? Vaginal bleeding. ? Redness and swelling at the incision site. ? Nausea and  vomiting.  You feel dizzy or weak.  You feel light-headed or you faint. This information is not intended to replace advice given to you by your health care provider. Make sure you discuss any questions you have with your health care provider. Document Released: 08/11/2004 Document Revised: 03/02/2016 Document Reviewed: 02/03/2016 Elsevier Interactive Patient Education  [employer].

## 2018-08-11 NOTE — MAU Note (Signed)
Nicole Alexander is a 43 y.o.  here in MAU reporting:  +vaginal bleeding. Pink in color. Wearing a pad but more noticeable when wipes. LMP: 07/28/18. States is still on her period. Called her OB office yesterday. Told to take a HPT; states it was positive at home. Therefore she was told to come here. Pain score: denies Vitals:   08/11/18 1136  BP: 132/78  Pulse: 84  Resp: 18  Temp: (!) 97.5 F (36.4 C)  SpO2: 96%     Lab orders placed from triage: ua and pregnancy test

## 2018-08-12 LAB — TYPE AND SCREEN
ABO/RH(D): A POS
Antibody Screen: NEGATIVE

## 2018-08-14 DIAGNOSIS — O00101 Right tubal pregnancy without intrauterine pregnancy: Secondary | ICD-10-CM | POA: Diagnosis not present

## 2018-08-17 ENCOUNTER — Encounter (HOSPITAL_COMMUNITY): Payer: Self-pay | Admitting: *Deleted

## 2018-08-17 ENCOUNTER — Telehealth (HOSPITAL_COMMUNITY): Payer: Self-pay | Admitting: *Deleted

## 2018-08-17 ENCOUNTER — Inpatient Hospital Stay (HOSPITAL_COMMUNITY)
Admission: AD | Admit: 2018-08-17 | Discharge: 2018-08-17 | Disposition: A | Discharge status: Home / Self Care | Payer: 59 | Source: Ambulatory Visit | Attending: Obstetrics and Gynecology | Admitting: Obstetrics and Gynecology

## 2018-08-17 ENCOUNTER — Other Ambulatory Visit: Payer: Self-pay | Admitting: Obstetrics and Gynecology

## 2018-08-17 DIAGNOSIS — O00101 Right tubal pregnancy without intrauterine pregnancy: Secondary | ICD-10-CM | POA: Diagnosis not present

## 2018-08-17 DIAGNOSIS — O009 Unspecified ectopic pregnancy without intrauterine pregnancy: Secondary | ICD-10-CM | POA: Diagnosis not present

## 2018-08-17 LAB — COMPREHENSIVE METABOLIC PANEL
ALBUMIN: 3.9 g/dL (ref 3.5–5.0)
ALT: 56 U/L — ABNORMAL HIGH (ref 0–44)
AST: 34 U/L (ref 15–41)
Alkaline Phosphatase: 43 U/L (ref 38–126)
Anion gap: 8 (ref 5–15)
BUN: 8 mg/dL (ref 6–20)
CO2: 22 mmol/L (ref 22–32)
CREATININE: 0.65 mg/dL (ref 0.44–1.00)
Calcium: 9 mg/dL (ref 8.9–10.3)
Chloride: 106 mmol/L (ref 98–111)
GFR calc Af Amer: >60 mL/min (ref >60)
GFR calc non Af Amer: >60 mL/min (ref >60)
Glucose, Bld: 121 mg/dL — ABNORMAL HIGH (ref 70–99)
Potassium: 3.7 mmol/L (ref 3.5–5.1)
Sodium: 136 mmol/L (ref 135–145)
Total Bilirubin: 0.3 mg/dL (ref 0.3–1.2)
Total Protein: 8 g/dL (ref 6.5–8.1)

## 2018-08-17 LAB — CBC
HEMATOCRIT: 33.5 % — AB (ref 36.0–46.0)
Hemoglobin: 11 g/dL — ABNORMAL LOW (ref 12.0–15.0)
MCH: 28.6 pg (ref 26.0–34.0)
MCHC: 32.8 g/dL (ref 30.0–36.0)
MCV: 87.2 fL (ref 80.0–100.0)
NRBC: 0 % (ref 0.0–0.2)
Platelets: 317 10*3/uL (ref 150–400)
RBC: 3.84 MIL/uL — ABNORMAL LOW (ref 3.87–5.11)
RDW: 14.1 % (ref 11.5–15.5)
WBC: 14.3 10*3/uL — ABNORMAL HIGH (ref 4.0–10.5)

## 2018-08-17 MED ORDER — METHOTREXATE INJECTION FOR WOMEN'S HOSPITAL
50.0000 mg/m2 | Freq: Once | INTRAMUSCULAR | Status: AC
  Administered 2018-08-17: 120 mg via INTRAMUSCULAR
  Filled 2018-08-17: qty 2.4

## 2018-08-17 NOTE — MAU Note (Signed)
Here for 2nd dose of Methotrexate. Has not had any bleeding, no pain.  Pt had planned to go to [place] this weekend.  Stressed to pt the risk and that travel is NOT advised.

## 2018-08-17 NOTE — Progress Notes (Signed)
Reviewed s/s to report. Will call office Monday am for f/u. To return to MAU over wkend for any concerns. Pt to lobby to wait 14mins after inj and then may leave.

## 2018-08-17 NOTE — Progress Notes (Signed)
Dr Ronita Hipps notified of CBC and CMP results. Order received to go ahead and given MTX.

## 2018-08-20 DIAGNOSIS — O009 Unspecified ectopic pregnancy without intrauterine pregnancy: Secondary | ICD-10-CM | POA: Diagnosis not present

## 2018-08-23 DIAGNOSIS — O009 Unspecified ectopic pregnancy without intrauterine pregnancy: Secondary | ICD-10-CM | POA: Diagnosis not present

## 2018-08-30 ENCOUNTER — Inpatient Hospital Stay (HOSPITAL_COMMUNITY): Payer: 59 | Admitting: Anesthesiology

## 2018-08-30 ENCOUNTER — Other Ambulatory Visit: Payer: Self-pay

## 2018-08-30 ENCOUNTER — Other Ambulatory Visit: Payer: Self-pay | Admitting: Obstetrics and Gynecology

## 2018-08-30 ENCOUNTER — Encounter (HOSPITAL_COMMUNITY): Payer: Self-pay | Admitting: *Deleted

## 2018-08-30 ENCOUNTER — Other Ambulatory Visit (HOSPITAL_COMMUNITY): Payer: Self-pay | Admitting: Obstetrics & Gynecology

## 2018-08-30 ENCOUNTER — Inpatient Hospital Stay (HOSPITAL_COMMUNITY)
Admission: AD | Admit: 2018-08-30 | Discharge: 2018-08-30 | Disposition: A | Discharge status: Home / Self Care | Payer: 59 | Attending: Obstetrics & Gynecology | Admitting: Obstetrics & Gynecology

## 2018-08-30 ENCOUNTER — Encounter (HOSPITAL_COMMUNITY)
Admission: AD | Disposition: A | Discharge status: Home / Self Care | Payer: Self-pay | Source: Home / Self Care | Attending: Obstetrics & Gynecology

## 2018-08-30 DIAGNOSIS — Z9049 Acquired absence of other specified parts of digestive tract: Secondary | ICD-10-CM | POA: Diagnosis not present

## 2018-08-30 DIAGNOSIS — Z8249 Family history of ischemic heart disease and other diseases of the circulatory system: Secondary | ICD-10-CM | POA: Diagnosis not present

## 2018-08-30 DIAGNOSIS — O00101 Right tubal pregnancy without intrauterine pregnancy: Secondary | ICD-10-CM | POA: Diagnosis not present

## 2018-08-30 DIAGNOSIS — Z87891 Personal history of nicotine dependence: Secondary | ICD-10-CM | POA: Diagnosis not present

## 2018-08-30 DIAGNOSIS — Z801 Family history of malignant neoplasm of trachea, bronchus and lung: Secondary | ICD-10-CM | POA: Diagnosis not present

## 2018-08-30 DIAGNOSIS — Z880 Allergy status to penicillin: Secondary | ICD-10-CM | POA: Insufficient documentation

## 2018-08-30 DIAGNOSIS — Z808 Family history of malignant neoplasm of other organs or systems: Secondary | ICD-10-CM | POA: Diagnosis not present

## 2018-08-30 DIAGNOSIS — K661 Hemoperitoneum: Secondary | ICD-10-CM | POA: Diagnosis present

## 2018-08-30 DIAGNOSIS — Z79899 Other long term (current) drug therapy: Secondary | ICD-10-CM | POA: Diagnosis not present

## 2018-08-30 DIAGNOSIS — Z833 Family history of diabetes mellitus: Secondary | ICD-10-CM | POA: Insufficient documentation

## 2018-08-30 DIAGNOSIS — Z882 Allergy status to sulfonamides status: Secondary | ICD-10-CM | POA: Insufficient documentation

## 2018-08-30 DIAGNOSIS — O009 Unspecified ectopic pregnancy without intrauterine pregnancy: Secondary | ICD-10-CM | POA: Diagnosis not present

## 2018-08-30 HISTORY — DX: Right tubal pregnancy without intrauterine pregnancy: K66.1

## 2018-08-30 HISTORY — PX: DIAGNOSTIC LAPAROSCOPY WITH REMOVAL OF ECTOPIC PREGNANCY: SHX6449

## 2018-08-30 HISTORY — DX: Right tubal pregnancy without intrauterine pregnancy: O00.101

## 2018-08-30 HISTORY — DX: Hemoperitoneum: K66.1

## 2018-08-30 HISTORY — PX: LAPAROSCOPIC UNILATERAL SALPINGECTOMY: SHX5934

## 2018-08-30 LAB — CBC
HCT: 32.2 % — ABNORMAL LOW (ref 36.0–46.0)
HEMOGLOBIN: 10.7 g/dL — AB (ref 12.0–15.0)
MCH: 29 pg (ref 26.0–34.0)
MCHC: 33.2 g/dL (ref 30.0–36.0)
MCV: 87.3 fL (ref 80.0–100.0)
Platelets: 402 10*3/uL — ABNORMAL HIGH (ref 150–400)
RBC: 3.69 MIL/uL — ABNORMAL LOW (ref 3.87–5.11)
RDW: 14.5 % (ref 11.5–15.5)
WBC: 13.9 10*3/uL — ABNORMAL HIGH (ref 4.0–10.5)
nRBC: 0 % (ref 0.0–0.2)

## 2018-08-30 LAB — COMPREHENSIVE METABOLIC PANEL
ALT: 36 U/L (ref 0–44)
AST: 25 U/L (ref 15–41)
Albumin: 4.3 g/dL (ref 3.5–5.0)
Alkaline Phosphatase: 47 U/L (ref 38–126)
Anion gap: 10 (ref 5–15)
BUN: 7 mg/dL (ref 6–20)
CO2: 21 mmol/L — ABNORMAL LOW (ref 22–32)
Calcium: 9.2 mg/dL (ref 8.9–10.3)
Chloride: 104 mmol/L (ref 98–111)
Creatinine, Ser: 0.7 mg/dL (ref 0.44–1.00)
GFR calc Af Amer: >60 mL/min (ref >60)
GFR calc non Af Amer: >60 mL/min (ref >60)
Glucose, Bld: 109 mg/dL — ABNORMAL HIGH (ref 70–99)
Potassium: 3.8 mmol/L (ref 3.5–5.1)
Sodium: 135 mmol/L (ref 135–145)
Total Bilirubin: 0.6 mg/dL (ref 0.3–1.2)
Total Protein: 7.9 g/dL (ref 6.5–8.1)

## 2018-08-30 LAB — TYPE AND SCREEN
ABO/RH(D): A POS
Antibody Screen: NEGATIVE

## 2018-08-30 SURGERY — LAPAROSCOPY, WITH ECTOPIC PREGNANCY SURGICAL TREATMENT
Anesthesia: General | Site: Abdomen | Laterality: Right

## 2018-08-30 MED ORDER — SUGAMMADEX SODIUM 200 MG/2ML IV SOLN
INTRAVENOUS | Status: DC | PRN
  Administered 2018-08-30: 250 mg via INTRAVENOUS

## 2018-08-30 MED ORDER — KETOROLAC TROMETHAMINE 30 MG/ML IJ SOLN
INTRAMUSCULAR | Status: AC
  Filled 2018-08-30: qty 1

## 2018-08-30 MED ORDER — ACETAMINOPHEN 10 MG/ML IV SOLN
INTRAVENOUS | Status: AC
  Filled 2018-08-30: qty 100

## 2018-08-30 MED ORDER — SODIUM CHLORIDE 0.9 % IR SOLN
Status: DC | PRN
  Administered 2018-08-30: 3000 mL

## 2018-08-30 MED ORDER — OXYCODONE-ACETAMINOPHEN 5-325 MG PO TABS: 1.0000 | ORAL_TABLET | Freq: Four times a day (QID) | ORAL | 0 refills | Status: AC | PRN

## 2018-08-30 MED ORDER — DEXAMETHASONE SODIUM PHOSPHATE 4 MG/ML IJ SOLN
INTRAMUSCULAR | Status: AC
  Filled 2018-08-30: qty 1

## 2018-08-30 MED ORDER — ROCURONIUM BROMIDE 100 MG/10ML IV SOLN
INTRAVENOUS | Status: DC | PRN
  Administered 2018-08-30: 40 mg via INTRAVENOUS

## 2018-08-30 MED ORDER — OXYCODONE HCL 5 MG PO TABS
5.0000 mg | ORAL_TABLET | Freq: Once | ORAL | Status: AC | PRN
  Administered 2018-08-30: 5 mg via ORAL

## 2018-08-30 MED ORDER — LIDOCAINE HCL (CARDIAC) PF 100 MG/5ML IV SOSY
PREFILLED_SYRINGE | INTRAVENOUS | Status: DC | PRN
  Administered 2018-08-30: 40 mg via INTRAVENOUS
  Administered 2018-08-30: 60 mg via INTRAVENOUS

## 2018-08-30 MED ORDER — ONDANSETRON HCL 4 MG/2ML IJ SOLN
INTRAMUSCULAR | Status: DC | PRN
  Administered 2018-08-30: 4 mg via INTRAVENOUS

## 2018-08-30 MED ORDER — SUCCINYLCHOLINE CHLORIDE 20 MG/ML IJ SOLN
INTRAMUSCULAR | Status: DC | PRN
  Administered 2018-08-30: 160 mg via INTRAVENOUS

## 2018-08-30 MED ORDER — LIDOCAINE HCL (PF) 1 % IJ SOLN
INTRAMUSCULAR | Status: AC
  Filled 2018-08-30: qty 5

## 2018-08-30 MED ORDER — OXYCODONE HCL 5 MG PO TABS
ORAL_TABLET | ORAL | Status: AC
  Filled 2018-08-30: qty 1

## 2018-08-30 MED ORDER — FENTANYL CITRATE (PF) 100 MCG/2ML IJ SOLN
INTRAMUSCULAR | Status: AC
  Administered 2018-08-30: 50 ug via INTRAVENOUS
  Filled 2018-08-30: qty 2

## 2018-08-30 MED ORDER — PROPOFOL 10 MG/ML IV BOLUS
INTRAVENOUS | Status: DC | PRN
  Administered 2018-08-30: 200 mg via INTRAVENOUS

## 2018-08-30 MED ORDER — FENTANYL CITRATE (PF) 250 MCG/5ML IJ SOLN
INTRAMUSCULAR | Status: AC
  Filled 2018-08-30: qty 5

## 2018-08-30 MED ORDER — IBUPROFEN 200 MG PO TABS: 600.0000 mg | ORAL_TABLET | Freq: Four times a day (QID) | ORAL | 0 refills | Status: AC | PRN

## 2018-08-30 MED ORDER — ACETAMINOPHEN 10 MG/ML IV SOLN
1000.0000 mg | Freq: Once | INTRAVENOUS | Status: DC | PRN
  Administered 2018-08-30: 1000 mg via INTRAVENOUS

## 2018-08-30 MED ORDER — KETOROLAC TROMETHAMINE 30 MG/ML IJ SOLN
30.0000 mg | Freq: Once | INTRAMUSCULAR | Status: AC
  Administered 2018-08-30: 30 mg via INTRAVENOUS

## 2018-08-30 MED ORDER — ROCURONIUM BROMIDE 100 MG/10ML IV SOLN
INTRAVENOUS | Status: AC
  Filled 2018-08-30: qty 1

## 2018-08-30 MED ORDER — MIDAZOLAM HCL 2 MG/2ML IJ SOLN
INTRAMUSCULAR | Status: AC
  Filled 2018-08-30: qty 2

## 2018-08-30 MED ORDER — ACETAMINOPHEN 500 MG PO TABS: 500.0000 mg | ORAL_TABLET | Freq: Four times a day (QID) | ORAL | 0 refills | Status: AC | PRN

## 2018-08-30 MED ORDER — MIDAZOLAM HCL 2 MG/2ML IJ SOLN
INTRAMUSCULAR | Status: DC | PRN
  Administered 2018-08-30: 2 mg via INTRAVENOUS

## 2018-08-30 MED ORDER — GLYCOPYRROLATE 0.2 MG/ML IJ SOLN
INTRAMUSCULAR | Status: AC
  Filled 2018-08-30: qty 3

## 2018-08-30 MED ORDER — PROMETHAZINE HCL 25 MG/ML IJ SOLN: 6.2500 mg | INTRAMUSCULAR | Status: DC | PRN

## 2018-08-30 MED ORDER — DEXAMETHASONE SODIUM PHOSPHATE 10 MG/ML IJ SOLN
INTRAMUSCULAR | Status: DC | PRN
  Administered 2018-08-30: 4 mg via INTRAVENOUS

## 2018-08-30 MED ORDER — FENTANYL CITRATE (PF) 100 MCG/2ML IJ SOLN
INTRAMUSCULAR | Status: DC | PRN
  Administered 2018-08-30: 50 ug via INTRAVENOUS
  Administered 2018-08-30: 100 ug via INTRAVENOUS
  Administered 2018-08-30: 50 ug via INTRAVENOUS

## 2018-08-30 MED ORDER — LIDOCAINE HCL (CARDIAC) PF 100 MG/5ML IV SOSY
PREFILLED_SYRINGE | INTRAVENOUS | Status: AC
  Filled 2018-08-30: qty 5

## 2018-08-30 MED ORDER — OXYCODONE HCL 5 MG/5ML PO SOLN: 5.0000 mg | Freq: Once | ORAL | Status: AC | PRN

## 2018-08-30 MED ORDER — SUGAMMADEX SODIUM 200 MG/2ML IV SOLN
INTRAVENOUS | Status: AC
  Filled 2018-08-30: qty 4

## 2018-08-30 MED ORDER — BUPIVACAINE HCL (PF) 0.25 % IJ SOLN
INTRAMUSCULAR | Status: DC | PRN
  Administered 2018-08-30: 7 mL
  Administered 2018-08-30: 19 mL

## 2018-08-30 MED ORDER — KETOROLAC TROMETHAMINE 30 MG/ML IJ SOLN
INTRAMUSCULAR | Status: AC
  Administered 2018-08-30: 30 mg via INTRAVENOUS
  Filled 2018-08-30: qty 1

## 2018-08-30 MED ORDER — PROPOFOL 10 MG/ML IV BOLUS
INTRAVENOUS | Status: AC
  Filled 2018-08-30: qty 20

## 2018-08-30 MED ORDER — FENTANYL CITRATE (PF) 100 MCG/2ML IJ SOLN
25.0000 ug | INTRAMUSCULAR | Status: DC | PRN
  Administered 2018-08-30 (×2): 50 ug via INTRAVENOUS

## 2018-08-30 MED ORDER — LACTATED RINGERS IV SOLN
INTRAVENOUS | Status: DC
  Administered 2018-08-30: 16:00:00 via INTRAVENOUS

## 2018-08-30 MED ORDER — ONDANSETRON HCL 4 MG/2ML IJ SOLN
INTRAMUSCULAR | Status: AC
  Filled 2018-08-30: qty 2

## 2018-08-30 SURGICAL SUPPLY — 33 items
ADH SKN CLS APL DERMABOND .7 (GAUZE/BANDAGES/DRESSINGS) ×2
BAG SPEC RTRVL LRG 6X4 10 (ENDOMECHANICALS) ×2
CABLE HIGH FREQUENCY MONO STRZ (ELECTRODE) IMPLANT
CATH ROBINSON RED A/P 16FR (CATHETERS) ×3 IMPLANT
DERMABOND ADVANCED (GAUZE/BANDAGES/DRESSINGS) ×1
DERMABOND ADVANCED .7 DNX12 (GAUZE/BANDAGES/DRESSINGS) ×2 IMPLANT
DRSG OPSITE POSTOP 3X4 (GAUZE/BANDAGES/DRESSINGS) ×1 IMPLANT
DURAPREP 26ML APPLICATOR (WOUND CARE) ×3 IMPLANT
FILTER SMOKE EVAC LAPAROSHD (FILTER) IMPLANT
GLOVE BIO SURGEON STRL SZ7 (GLOVE) ×3 IMPLANT
GLOVE BIOGEL PI IND STRL 7.0 (GLOVE) ×4 IMPLANT
GLOVE BIOGEL PI INDICATOR 7.0 (GLOVE) ×2
GOWN STRL REUS W/TWL LRG LVL3 (GOWN DISPOSABLE) ×9 IMPLANT
HIBICLENS CHG 4% 4OZ BTL (MISCELLANEOUS) ×3 IMPLANT
LIGASURE VESSEL 5MM BLUNT TIP (ELECTROSURGICAL) ×1 IMPLANT
MANIPULATOR UTERINE 4.5 ZUMI (MISCELLANEOUS) ×3 IMPLANT
NS IRRIG 1000ML POUR BTL (IV SOLUTION) ×3 IMPLANT
PACK LAPAROSCOPY BASIN (CUSTOM PROCEDURE TRAY) ×3 IMPLANT
PACK TRENDGUARD 450 HYBRID PRO (MISCELLANEOUS) IMPLANT
POUCH SPECIMEN RETRIEVAL 10MM (ENDOMECHANICALS) ×1 IMPLANT
PROTECTOR NERVE ULNAR (MISCELLANEOUS) ×6 IMPLANT
SCISSORS LAP 5X35 DISP (ENDOMECHANICALS) IMPLANT
SET IRRIG TUBING LAPAROSCOPIC (IRRIGATION / IRRIGATOR) IMPLANT
SLEEVE XCEL OPT CAN 5 100 (ENDOMECHANICALS) ×3 IMPLANT
SOLUTION ELECTROLUBE (MISCELLANEOUS) IMPLANT
SUT VICRYL 0 UR6 27IN ABS (SUTURE) ×3 IMPLANT
SUT VICRYL 4-0 PS2 18IN ABS (SUTURE) ×3 IMPLANT
TOWEL OR 17X24 6PK STRL BLUE (TOWEL DISPOSABLE) ×6 IMPLANT
TRENDGUARD 450 HYBRID PRO PACK (MISCELLANEOUS) ×3
TROCAR BALLN 12MMX100 BLUNT (TROCAR) ×1 IMPLANT
TROCAR XCEL NON-BLD 5MMX100MML (ENDOMECHANICALS) ×3 IMPLANT
TUBING INSUF HEATED (TUBING) ×3 IMPLANT
WARMER LAPAROSCOPE (MISCELLANEOUS) ×3 IMPLANT

## 2018-08-30 NOTE — Anesthesia Preprocedure Evaluation (Addendum)
Anesthesia Evaluation  Patient identified by MRN, date of birth, ID band Patient awake    Reviewed: Allergy & Precautions, NPO status , Patient's Chart, lab work & pertinent test results  History of Anesthesia Complications Negative for: history of anesthetic complications  Airway Mallampati: II  TM Distance: >3 FB Neck ROM: Full    Dental  (+) Teeth Intact, Dental Advisory Given   Pulmonary former smoker,    Pulmonary exam normal breath sounds clear to auscultation       Cardiovascular negative cardio ROS Normal cardiovascular exam Rhythm:Regular Rate:Normal     Neuro/Psych negative neurological ROS     GI/Hepatic negative GI ROS, Neg liver ROS,   Endo/Other  Obese, BMI 38.7  Renal/GU negative Renal ROS     Musculoskeletal negative musculoskeletal ROS (+)   Abdominal   Peds  Hematology  (+) anemia ,   Anesthesia Other Findings Day of surgery medications reviewed with the patient.  Reproductive/Obstetrics Ruptured ectopic pregnancy                           Anesthesia Physical Anesthesia Plan  ASA: III and emergent  Anesthesia Plan: General   Post-op Pain Management:    Induction: Intravenous, Rapid sequence and Cricoid pressure planned  PONV Risk Score and Plan: 3 and Treatment may vary due to age or medical condition, Ondansetron, Dexamethasone and Midazolam  Airway Management Planned: Oral ETT  Additional Equipment:   Intra-op Plan:   Post-operative Plan: Extubation in OR  Informed Consent: I have reviewed the patients History and Physical, chart, labs and discussed the procedure including the risks, benefits and alternatives for the proposed anesthesia with the patient or authorized representative who has indicated his/her understanding and acceptance.     Dental advisory given  Plan Discussed with: CRNA  Anesthesia Plan Comments:        Anesthesia Quick  Evaluation

## 2018-08-30 NOTE — Discharge Instructions (Signed)
DISCHARGE INSTRUCTIONS: Laparoscopy  The following instructions have been prepared to help you care for yourself upon your return home today.  Wound care:  Do not get the incision wet for the first 24 hours. The incision should be kept clean and dry.  The Band-Aids or dressings may be removed the day after surgery.  Should the incision become sore, red, and swollen after the first week, check with your doctor.  Personal hygiene:  Shower the day after your procedure.  Activity and limitations:  Do NOT drive or operate any equipment today.  Do NOT lift anything more than 15 pounds for 2-3 weeks after surgery.  Do NOT rest in bed all day.  Walking is encouraged. Walk each day, starting slowly with 5-minute walks 3 or 4 times a day. Slowly increase the length of your walks.  Walk up and down stairs slowly.  Do NOT do strenuous activities, such as golfing, playing tennis, bowling, running, biking, weight lifting, gardening, mowing, or vacuuming for 2-4 weeks. Ask your doctor when it is okay to start.  Diet: Eat a light meal as desired this evening. You may resume your usual diet tomorrow.  Return to work: This is dependent on the type of work you do. For the most part you can return to a desk job within a week of surgery. If you are more active at work, please discuss this with your doctor.  What to expect after your surgery: You may have a slight burning sensation when you urinate on the first day. You may have a very small amount of blood in the urine. Expect to have a small amount of vaginal discharge/light bleeding for 1-2 weeks. It is not unusual to have abdominal soreness and bruising for up to 2 weeks. You may be tired and need more rest for about 1 week. You may experience shoulder pain for 24-72 hours. Lying flat in bed may relieve it.  Call your doctor for any of the following:  Develop a fever of 100.4 or greater  Inability to urinate 6 hours after discharge from  hospital  Severe pain not relieved by pain medications  Persistent of heavy bleeding at incision site  Redness or swelling around incision site after a week  Increasing nausea or vomiting  Patient Signature________________________________________ Nurse Signature_________________________________________       Ruptured Ectopic Pregnancy  An ectopic pregnancy is when a fertilized egg attaches (implants) outside of the uterus, usually in a fallopian tube. A ruptured ectopic pregnancy is when the fallopian tube tears or bursts. This results in internal bleeding, intense abdominal pain, and sometimes, vaginal bleeding. Most ectopic pregnancies occur in the fallopian tube. In rare cases, it may occur on the ovary, intestine, pelvis, or cervix. An ectopic pregnancy does not have the ability to develop into a normal, healthy baby. A ruptured ectopic pregnancy can affect your ability to have children (fertility), depending on damage it causes to your reproductive organs. Ruptured ectopic pregnancy is a medical emergency. If not treated immediately, it can lead to blood loss, shock, or even death. What are the causes? Most ectopic pregnancies are caused by damage to the fallopian tubes. The damage prevents the fertilized egg from implanting in the uterus. In some cases, the cause may not be known. What increases the risk? You are at increased risk for an ectopic pregnancy if:  You have had a previous ectopic pregnancy.  You have had previous fallopian tube surgery.  You have had previous surgery to have the fallopian  tubes tied (tubal ligation).  You have had infertility treatments or have a history of infertility.  You have been exposed to DES. DES is a medicine that was used until 1971 and had effects on babies whose mothers took the medicine.  You use an IUD (intrauterine device) for birth control.  You use progestin-only oral contraception for birth control.  You have a history of  pelvic inflammatory disease (PID).  You have a history of endometriosis.  You smoke.  You became sexually active before 43 years of age.  You have multiple sexual partners. What are the signs or symptoms? Symptoms of a ruptured ectopic pregnancy and internal bleeding may include:  Sudden, severe pain in the abdomen and pelvis.  Dizziness or fainting.  Pain in the shoulder area.  Vaginal bleeding. How is this diagnosed? This condition is diagnosed based on your medical history, symptoms, a physical exam, and tests, which may include:  A pregnancy test.  An ultrasound.  Measuring the levels of the pregnancy hormone in the bloodstream.  Taking a sample of tissue from the uterus (dilation and curettage, D&C).  Surgery to visually examine the inside of the abdomen using a lighted tube (laparoscopy). How is this treated? This condition is treated with IV fluids and emergency surgery to remove the ectopic pregnancy and repair the area where the rupture occured. If you have lost a lot of blood, you may need a blood transfusion. If you are Rh negative and your baby's father is Rh positive, or the Rh type of the father is unknown, you may receive a Rho (D) immune globulin shot. This is to prevent Rh problems in future pregnancies. Additional medicines may be given. Get help right away if:  You are taking medicines to treat an ectopic pregnancy and you develop symptoms of a rupture. These include: ? Fever or chills. ? Shoulder pain. ? Vaginal bleeding. ? Nausea and vomiting. ? Severe abdominal pain or cramping. ? Feeling light-headed or fainting. Summary  An ectopic pregnancy is when a fertilized egg attaches (implants) outside of the uterus, usually in a fallopian tube. A ruptured ectopic pregnancy is when the fallopian tube tears or bursts.  Ruptured ectopic pregnancy is a medical emergency. If not treated immediately, it can lead to blood loss, shock, or even death.  This  condition is treated with IV fluids and emergency surgery to remove the ectopic pregnancy and repair the area where the rupture occured. If you have lost a lot of blood, you may need a blood transfusion. This information is not intended to replace advice given to you by your health care provider. Make sure you discuss any questions you have with your health care provider. Document Released: 07/01/2000 Document Revised: 09/21/2016 Document Reviewed: 09/21/2016 Elsevier Interactive Patient Education  2019 [facility] Instructions  Activity: Get plenty of rest for the remainder of the day. A responsible individual must stay with you for 24 hours following the procedure.  For the next 24 hours, DO NOT: -Drive a car -Paediatric nurse -Drink alcoholic beverages -Take any medication unless instructed by your physician -Make any legal decisions or sign important papers.  Meals: Start with liquid foods such as gelatin or soup. Progress to regular foods as tolerated. Avoid greasy, spicy, heavy foods. If nausea and/or vomiting occur, drink only clear liquids until the nausea and/or vomiting subsides. Call your physician if vomiting continues.  Special Instructions/Symptoms: Your throat may feel dry or sore from  the anesthesia or the breathing tube placed in your throat during surgery. If this causes discomfort, gargle with warm salt water. The discomfort should disappear within 24 hours.  If you had a scopolamine patch placed behind your ear for the management of post- operative nausea and/or vomiting:  1. The medication in the patch is effective for 72 hours, after which it should be removed.  Wrap patch in a tissue and discard in the trash. Wash hands thoroughly with soap and water. 2. You may remove the patch earlier than 72 hours if you experience unpleasant side effects which may include dry mouth, dizziness or visual disturbances. 3. Avoid touching the  patch. Wash your hands with soap and water after contact with the patch.

## 2018-08-30 NOTE — MAU Note (Signed)
Started hurting around 8 last night.  Had appt today for blood work.  Because of pain, they did an [place], is now ruptured.

## 2018-08-30 NOTE — MAU Note (Signed)
Pt is a G3P0 with known ruptured ectopic.  Period like bleeding for 3 days.

## 2018-08-30 NOTE — Anesthesia Procedure Notes (Signed)
Procedure Name: Intubation Date/Time: 08/30/2018 4:11 PM Performed by: Hewitt Blade, CRNA Pre-anesthesia Checklist: Patient identified, Emergency Drugs available, Suction available and Patient being monitored Patient Re-evaluated:Patient Re-evaluated prior to induction Oxygen Delivery Method: Circle system utilized Preoxygenation: Pre-oxygenation with 100% oxygen Induction Type: IV induction, Rapid sequence and Cricoid Pressure applied Laryngoscope Size: Mac and 3 Grade View: Grade I Tube type: Oral Tube size: 7.0 mm Number of attempts: 1 Airway Equipment and Method: Stylet Placement Confirmation: ETT inserted through vocal cords under direct vision,  positive ETCO2,  breath sounds checked- equal and bilateral and CO2 detector Secured at: 21 cm Tube secured with: Tape Dental Injury: Teeth and Oropharynx as per pre-operative assessment

## 2018-08-30 NOTE — Transfer of Care (Signed)
Immediate Anesthesia Transfer of Care Note  Patient: Azaylia C Ross-Clayton  Procedure(s) Performed: DIAGNOSTIC LAPAROSCOPY WITH REMOVAL OF ECTOPIC PREGNANCY (N/A Abdomen) LAPAROSCOPIC UNILATERAL SALPINGECTOMY (Right Abdomen)  Patient Location: PACU  Anesthesia Type:General  Level of Consciousness: awake, alert  and oriented  Airway & Oxygen Therapy: Patient Spontanous Breathing and Patient connected to nasal cannula oxygen  Post-op Assessment: Report given to RN, Post -op Vital signs reviewed and stable and Patient moving all extremities  Post vital signs: Reviewed and stable  Last Vitals:  Vitals Value Taken Time  BP    Temp    Pulse    Resp    SpO2      Last Pain:  Vitals:   08/30/18 1524  TempSrc: Oral  PainSc:          Complications: No apparent anesthesia complications

## 2018-08-30 NOTE — Op Note (Signed)
Preoperative diagnosis: Ruptured right tubal ectopic pregnancy Postoperative diagnosis: Same Procedure: Laparoscopic right salpingectomy  Surgeon:  Azucena Fallen, MD Assistants: Artelia Laroche, CNM Anesthesia Gen. Endotracheal IV fluids LR  1000 cc LR EBL minimal  20 cc Urine clear  50 cc clear in foley Hemoperitoneum 161 cc approx Complications none Disposition PACU and home Specimens Right fallopian tube with ectopic pregnancy  Procedure Patient is 43 yo G3P0020 with failed Methotrexate x 2 doses with ruptured ectopic pregnancy and hemoperitoneum, sent from office for laparoscopic salpingectomy. Surgery reviewed, risk and complications of surgery including infection, bleeding, damage to internal organs, other complications including pneumonia, VTE were reviewed. Informed written consent was obtained and patient was brought to the operating room with IV running.  Timeout carried out. Consent reviewed.  She underwent general anesthesia without difficulty and was given dorsal lithotomy position. She was prepped and draped in standard fashion and foley was placed. Zumi uterine manipulator placed. Gloves gown changed.  10 mm incision made at the umbilicus's lower edge after injecting nesacaine. Fascia grasped and incised. Peritoneum entered bluntly with finger dissection. Hassan canula with balloon placed, balloon inflated. Pneumoperitoneum created.  Hemoperitoneum noted. 2 lower incisions made after marcaine injection, two 5 mm canulas placed under vision. Hemoperitoneum aspirated. Right tube noted with ectopic pregnancy. Right salpingectomy performed with Ligasure with excellent hemostasis. Left tube was normal and both the ovaries normal. No pelvic adhesions or endometriosis noted. Small subserosal fibroids noted. Clots and hemoperitoneum aspirated.  Endobag placed via central port and specimen placed in the bag and removed from central port.  Laparoscope reinserted, thorough irrigation done,  hemoperitoneum cleared. No active bleeding noted. Normal liver and bowels.   All ports removed under vision and pneumperitoneum released. Central port fascia closed with 0-Vicryl and all skin incisions closed with 4-0 Vicryl. Dressing placed in central port and Dermabond on lower incisions.  Foley and uterine Zumi manipulator removed.   All counts were correct x2. Patient brought out to the recovery room after extubation in stable condition.  Discharge home, follow up in office in 2 weeks. Post-op care and warning signs and surgical findings were discussed with patient's husband.   I performed the surgery.   V.Jadis Pitter, MD

## 2018-08-30 NOTE — H&P (Signed)
Nicole Alexander is an 43 y.o. female with know right ectopic pregnancy, s/p methotrexate x 2 doses (1/25, 1/31) presented to office for pelvic sono that noted large pelvic fluid and tenderness c/w ruptured ectopic pregnancy.  Reports pain since last evening, really sharp as she was driving up from [place] after home visit for her job. Pain was worse enough that she chose not to eat the dinner she bought. This morning pain continued but she went to work waiting for office appointment, she didn't call anyone at the office.  Pain present, worse with movements. No dizziness or SOB/ CP.  She was able to drive from office to MAU. She is NPO since last night   ObHx- poor, one SAB, one ectopic in past treated successfully with methotrexate.    Medical/ surgical/famHx/ ROS reviewed  Past Medical History:  Diagnosis Date  . Cervical lymphadenitis 11/25/2015    Past Surgical History:  Procedure Laterality Date  . BREAST SURGERY     breast reduction  . CHOLECYSTECTOMY N/A 11/28/2012   Procedure: LAPAROSCOPIC CHOLECYSTECTOMY WITH INTRAOPERATIVE CHOLANGIOGRAM;  Surgeon: Zenovia Jarred, MD;  Location: [facility];  Service: General;  Laterality: N/A;  . TONSILLECTOMY      Family History  Problem Relation Age of Onset  . Heart disease Mother        PTCA/stent  . Hypertension Mother   . Diabetes Mother   . Hyperlipidemia Mother   . Cancer Father        lung  . Hypertension Father   . Diabetes Father   . Hyperlipidemia Father   . Hypertension Brother   . Cancer Maternal Grandfather        bone    Social History:  reports that she quit smoking about 20 years ago. Her smoking use included cigarettes. She has a 2.00 pack-year smoking history. She has never used smokeless tobacco. She reports previous alcohol use. She reports that she does not use drugs.  Allergies:  Allergies  Allergen Reactions  . Penicillins Swelling    "swole up like a balloon" Has patient had a PCN reaction  causing immediate rash, facial/tongue/throat swelling, SOB or lightheadedness with hypotension: YES Has patient had a PCN reaction causing severe rash involving mucus membranes or skin necrosis: NO Has patient had a PCN reaction that required hospitalization NO Has patient had a PCN reaction occurring within the last 10 years: NO If all of the above answers are "NO", then may proceed with Cephalosporin use.  . Sulfa Antibiotics     Medications Prior to Admission  Medication Sig Dispense Refill Last Dose  . cholecalciferol (VITAMIN D) 1000 units tablet Take 1,000 Units by mouth daily.   Taking  . Probiotic Product (PROBIOTIC-10 PO) Take 1 capsule by mouth daily.   Taking    ROS  Blood pressure (!) 141/97, pulse (!) 108, temperature 98 F (36.7 C), temperature source Oral, resp. rate 20, height 5\' 8"  (1.727 m), weight 115.3 kg, last menstrual period 06/19/2018, SpO2 100 %. Physical Exam Physical exam:  A&O x 3, no acute distress. Pleasant but in pain  HEENT neg Lungs CTA bilat CV RRR, S1S2 normal but tachycardia Abdo soft, TENDER, acute Extr no edema/ tenderness Pelvic deferred  Office sono - large pelvic fluid upto right upper quadrant when lying down c/w hemoperitoneum  Results for orders placed or performed during the hospital encounter of 08/30/18 (from the past 24 hour(s))  CBC     Status: Abnormal   Collection Time: 08/30/18  3:43 PM  Result Value Ref Range   WBC 13.9 (H) 4.0 - 10.5 K/uL   RBC 3.69 (L) 3.87 - 5.11 MIL/uL   Hemoglobin 10.7 (L) 12.0 - 15.0 g/dL   HCT 32.2 (L) 36.0 - 46.0 %   MCV 87.3 80.0 - 100.0 fL   MCH 29.0 26.0 - 34.0 pg   MCHC 33.2 30.0 - 36.0 g/dL   RDW 14.5 11.5 - 15.5 %   Platelets 402 (H) 150 - 400 K/uL   nRBC 0.0 0.0 - 0.2 %    No results found.  Assessment/Plan: 43 yo female G3P0020 with ruptured right adnexal ectopic pregnancy. Failed Methotrexate x 2 doses. Here for Laparoscopic right salpingectomy.  Risks/complications of surgery  reviewed incl infection, bleeding, damage to internal organs including bladder, bowels, ureters, blood vessels, other risks from anesthesia, VTE and delayed complications of any surgery, complications in future surgery reviewed.  Plan lysis of adhesions if noted, assess for any obvious cause for recurrent ectopic pregnancies.  Pt and husband voice understanding and agree and give informed written consent.    Elveria Royals 08/30/2018, 4:01 PM

## 2018-08-30 NOTE — Anesthesia Postprocedure Evaluation (Signed)
Anesthesia Post Note  Patient: Nicole Alexander  Procedure(s) Performed: DIAGNOSTIC LAPAROSCOPY WITH REMOVAL OF ECTOPIC PREGNANCY (N/A Abdomen) LAPAROSCOPIC UNILATERAL SALPINGECTOMY (Right Abdomen)     Patient location during evaluation: PACU Anesthesia Type: General Level of consciousness: awake and alert Pain management: pain level controlled Vital Signs Assessment: post-procedure vital signs reviewed and stable Respiratory status: spontaneous breathing, nonlabored ventilation and respiratory function stable Cardiovascular status: blood pressure returned to baseline and stable Postop Assessment: no apparent nausea or vomiting Anesthetic complications: no                    Brennan Bailey

## 2018-08-31 ENCOUNTER — Encounter (HOSPITAL_COMMUNITY): Payer: Self-pay | Admitting: Obstetrics & Gynecology

## 2018-12-25 NOTE — Assessment & Plan Note (Deleted)
Previous work-up but did not reveal any clear-cut etiology.  It was felt to be reactive or inflammation or infection related.  Patient had painful lymphadenopathy and inflammation.  Work-up was negative for flow cytometry as well as blood smear evaluation. Patient was referred back to [place] because he was having continued elevation of white blood cell count and lymphocytes.  Lab review:

## 2019-01-01 ENCOUNTER — Inpatient Hospital Stay: Payer: 59 | Attending: Internal Medicine

## 2019-01-01 ENCOUNTER — Inpatient Hospital Stay: Payer: 59 | Admitting: Hematology and Oncology

## 2019-06-18 ENCOUNTER — Encounter (HOSPITAL_COMMUNITY): Payer: Self-pay

## 2020-06-03 IMAGING — US US OB TRANSVAGINAL
1 series · 15 of 28 positions shown · non-contrast
Comparison: None.

CLINICAL DATA: Bleeding during early pregnancy

EXAM:
TRANSVAGINAL OB ULTRASOUND
TECHNIQUE: Transvaginal ultrasound was performed for complete evaluation of the
gestation as well as the maternal uterus, adnexal regions, and
pelvic cul-de-sac.

[Series 1: us ob transvaginal · 45 acquisitions, 15 frames shown]
[im 1/45]
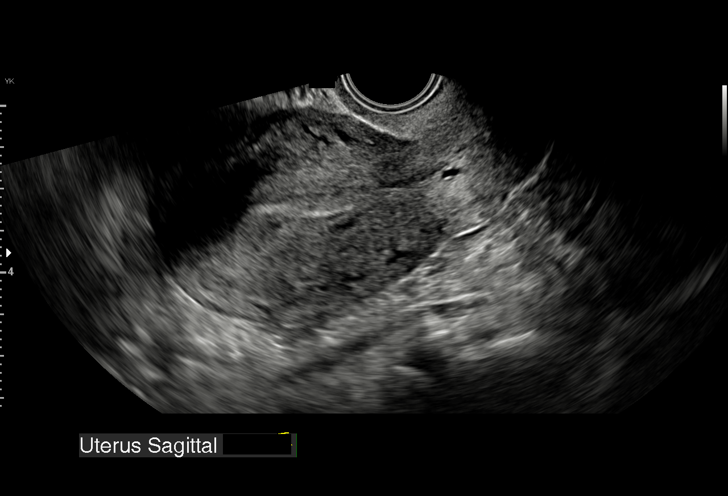
[im 4/45]
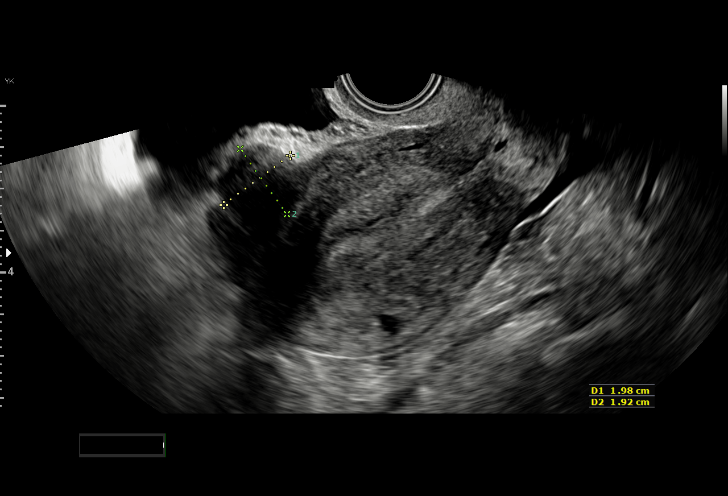
[im 7/45]
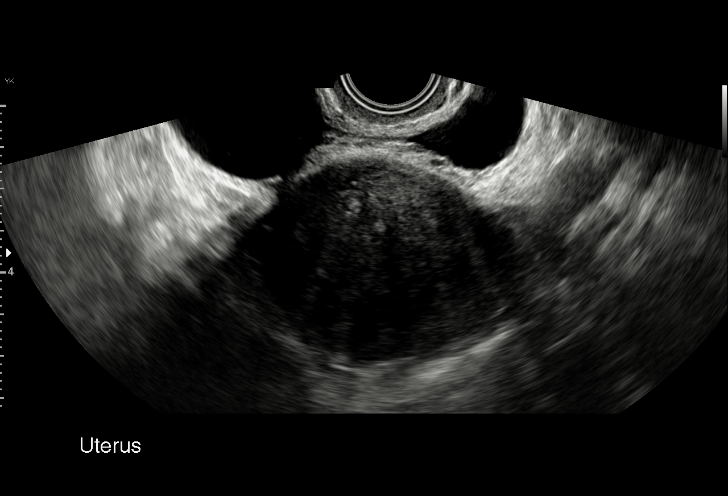
[im 10/45]
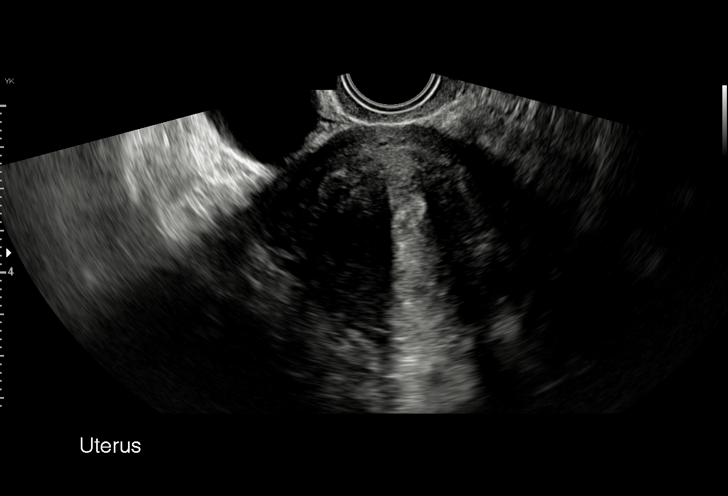
[im 14/45]
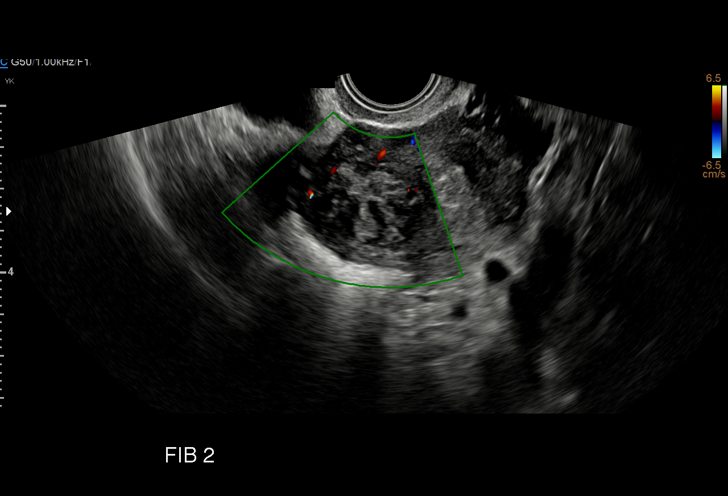
[im 17/45]
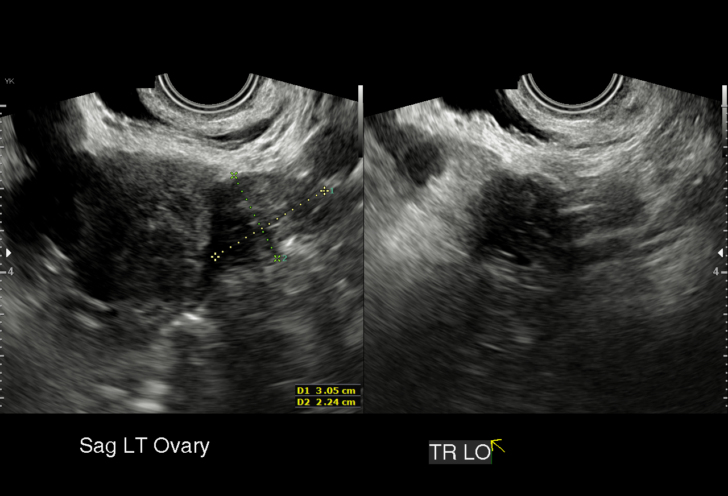
[im 20/45]
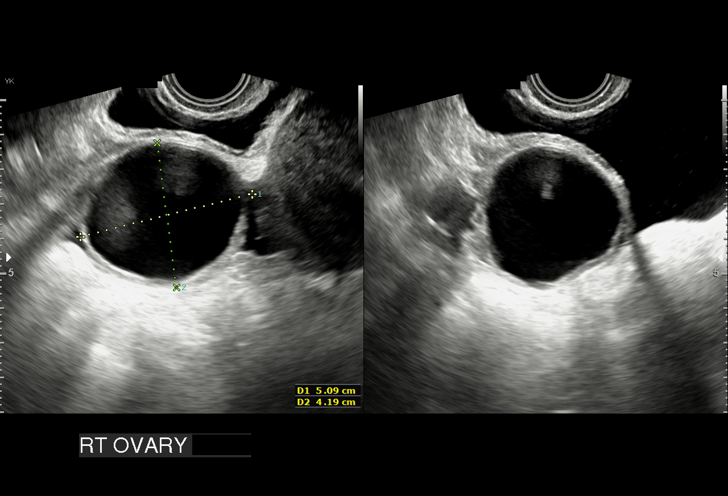
[im 23/45]
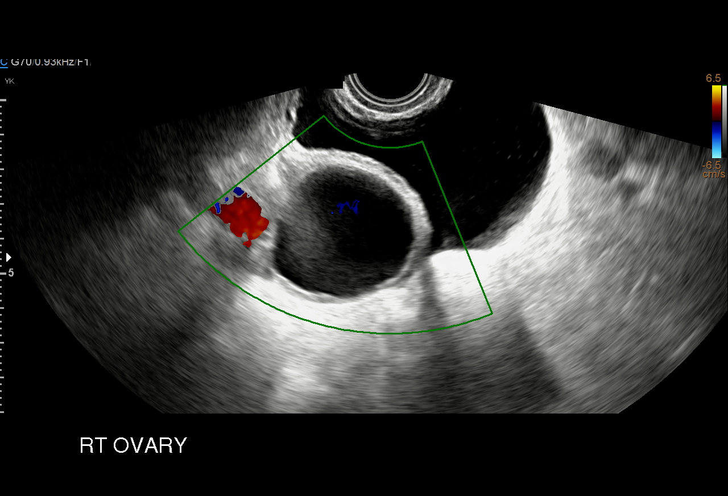
[im 25/45]
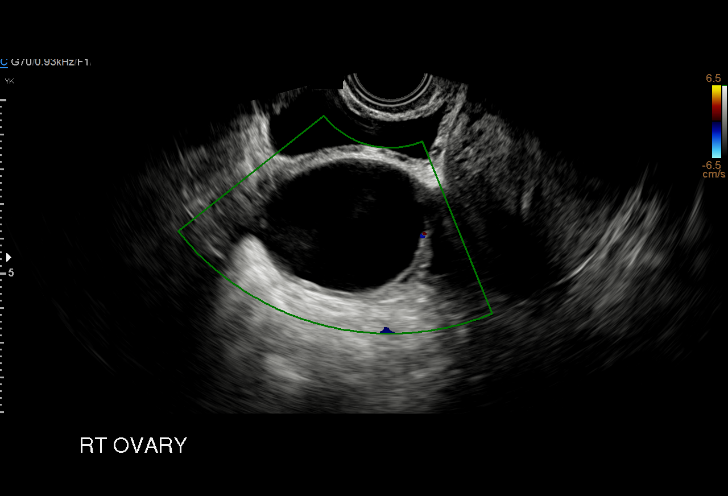
[im 28/45]
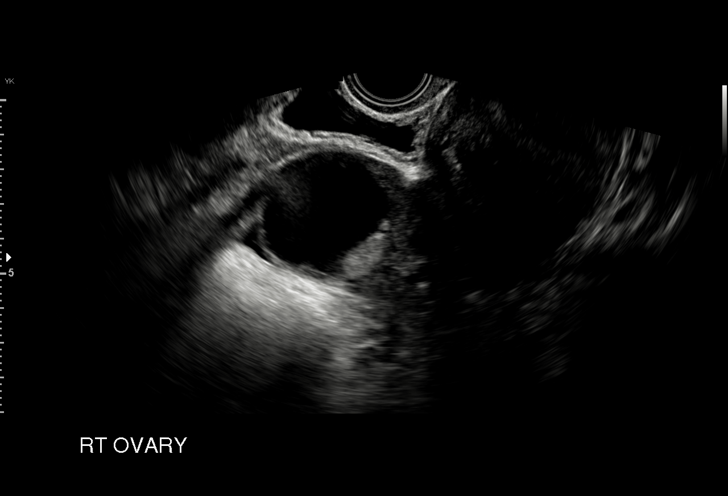
[im 31/45]
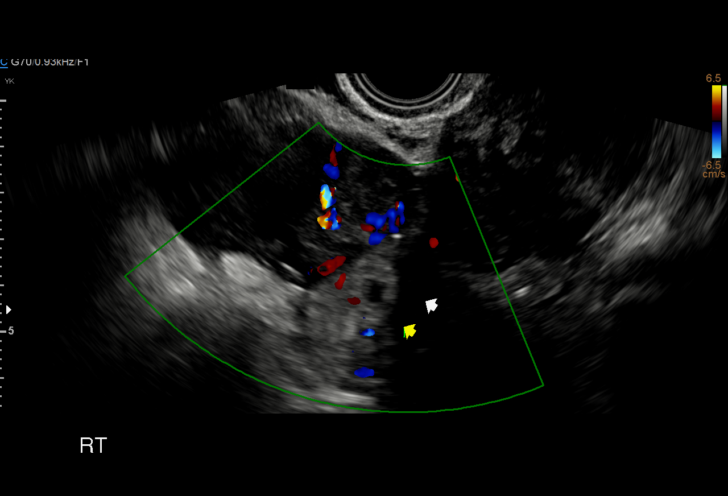
[im 35/45]
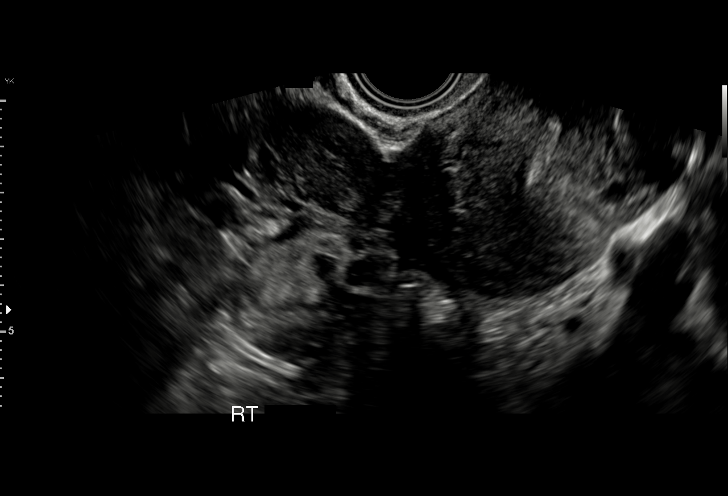
[im 38/45]
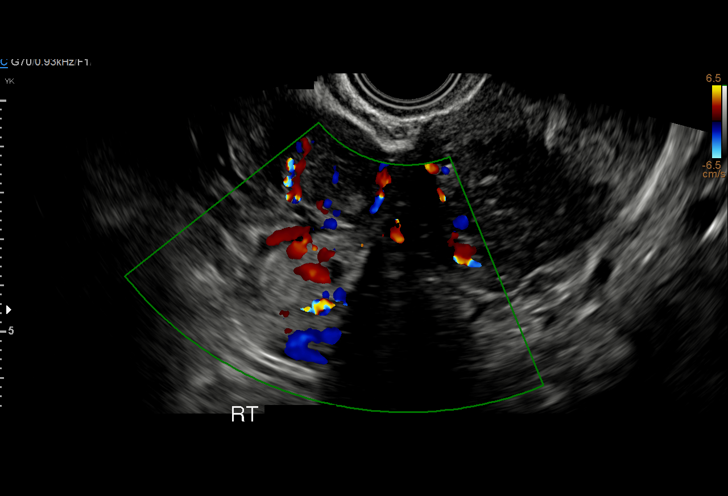
[im 41/45]
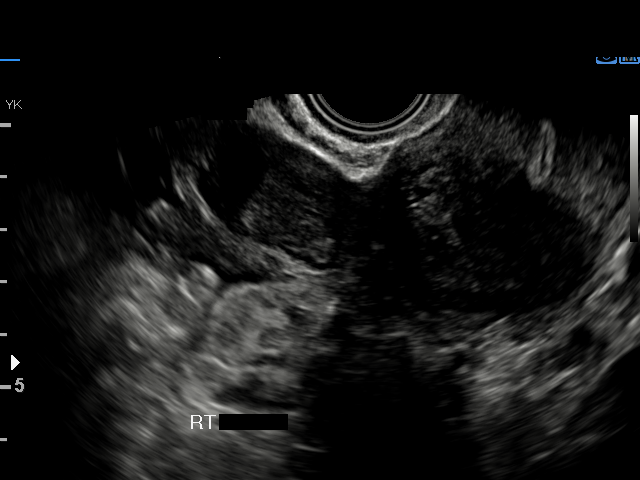
[im 45/45]
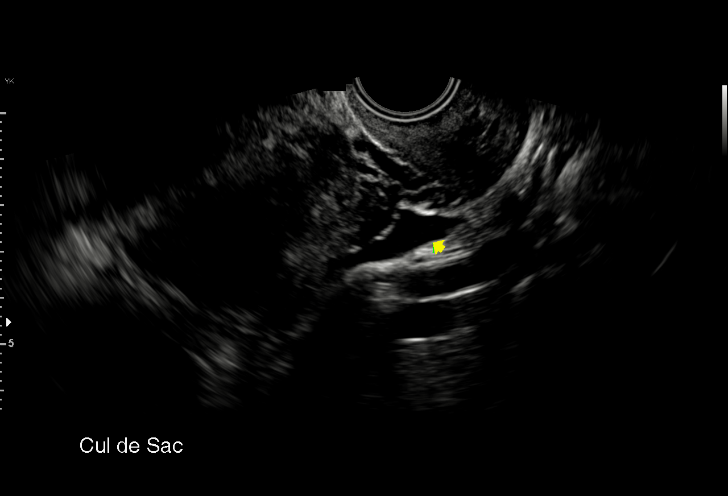

[15 of 28 positions shown; findings below may reference images not displayed]

FINDINGS: Intrauterine gestational sac: None

Yolk sac:  Not visualized

Embryo:  Not visualized

Cardiac Activity: Not visualized

Heart Rate: Not applicable bpm

Maternal uterus/adnexae:

Subchorionic hemorrhage: Not applicable

Right ovary: There is a large simple appearing anechoic cyst
associated with the right ovary measuring 5 x 4.4 x 6.2 cm.

Left ovary: Normal.

Other :Within the right adnexa adjacent but separate from the right
ovary there is a solid, hyperechoic structure with a single internal
anechoic structure. This measures 3 x 2.3 x 1.7 cm and is concerning
for ectopic pregnancy.

Three uterine fibroids are noted. The largest is identified within
the right side of uterus measuring 2.8 x 2.6 x 2.1 cm. Subserosal
fibroid arising from the right anterior fundus measures 2.1 x 2.0 x
2.0 cm.

Free fluid:  Trace free fluid.
IMPRESSION: 1. No intrauterine gestation identified.
2. Solid echogenic mass with central anechoic structure is
identified within the right adnexa adjacent to the right ovary.
Suspicious for ectopic pregnancy.
3. Large right ovary cysts
4. Uterine fibroids.

## 2022-02-14 ENCOUNTER — Encounter (HOSPITAL_BASED_OUTPATIENT_CLINIC_OR_DEPARTMENT_OTHER): Payer: Self-pay | Admitting: Cardiovascular Disease

## 2022-02-14 ENCOUNTER — Ambulatory Visit (HOSPITAL_BASED_OUTPATIENT_CLINIC_OR_DEPARTMENT_OTHER): Payer: 59 | Admitting: Cardiovascular Disease

## 2022-02-14 DIAGNOSIS — R000 Tachycardia, unspecified: Secondary | ICD-10-CM

## 2022-02-14 DIAGNOSIS — I10 Essential (primary) hypertension: Secondary | ICD-10-CM

## 2022-02-14 HISTORY — DX: Tachycardia, unspecified: R00.0

## 2022-02-14 HISTORY — DX: Essential (primary) hypertension: I10

## 2022-02-14 NOTE — Progress Notes (Signed)
[facility] Initial Assessment:    Date:  02/14/2022   ID:  Nicole Alexander, DOB [birth year], MRN [id number]  PCP:  Donald Prose, MD  Cardiologist:  None  Nephrologist:  Referring MD: Servando Salina, MD   CC: Hypertension  History of Present Illness:    Nicole Alexander is a 46 y.o. female with a hx of hypertension, here to establish care in the [facility]. Referral notes from Dr. Garwin Brothers personally reviewed. She saw Dr. Garwin Brothers 09/2021 and her BP was 144/100 on amlodipine, so she was referred to the [facility].  Today, she states she was started on antihypertensives about a year ago. She does not regularly check her BP at home, but states it has always been high. Per her EKG today, her heart rate was 115 bpm. She would attribute her higher heart rates to dealing with stress at work. She only notices this occasionally. Additionally, about 3 weeks ago she developed right knee edema. She notes that today her swelling is much improved. Typically she wears a knee brace. She does not get regular exercise. When she does participate in strenuous activities, she will feel fatigued. She denies any anginal symptoms. She tends to cook at home if possible. She recognizes the necessity to alter her meal times. 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 Her diet does not regularly involve salt or sugar, instead she uses other seasonings including onion and garlic powder that are salt-free. She drinks 1 cup of black coffee, sometimes with a small amount of plant-based creamer. She occasionally drinks a glass of wine (about twice a week), and only smokes little cigars on occasion. Her husband reports that she snores. She may catch herself snoring only if she is very tired. Additionally she complains of insomnia. She takes 2 Benadryl around 8 PM  on Sunday through Thursday night to get to sleep and wake up on time for work. She has had four incomplete pregnancies since 2020 but does not have any plans for further pregnancy. She denies any chest pain, or shortness of breath. No lightheadedness, headaches, syncope, orthopnea, or PND.   Past Medical History:  Diagnosis Date   Cervical lymphadenitis 11/25/2015   Essential hypertension 02/14/2022   Ruptured right tubal ectopic pregnancy causing hemoperitoneum 08/30/2018   Tachycardia 02/14/2022    Past Surgical History:  Procedure Laterality Date   BREAST SURGERY     breast reduction   CHOLECYSTECTOMY N/A 11/28/2012   Procedure: LAPAROSCOPIC CHOLECYSTECTOMY WITH INTRAOPERATIVE CHOLANGIOGRAM;  Surgeon: Zenovia Jarred, MD;  Location: [place];  Service: General;  Laterality: N/A;   DIAGNOSTIC LAPAROSCOPY WITH REMOVAL OF ECTOPIC PREGNANCY N/A 08/30/2018   Procedure: DIAGNOSTIC LAPAROSCOPY WITH REMOVAL OF ECTOPIC PREGNANCY;  Surgeon: Azucena Fallen, MD;  Location: [facility];  Service: Gynecology;  Laterality: N/A;   LAPAROSCOPIC UNILATERAL SALPINGECTOMY Right 08/30/2018   Procedure: LAPAROSCOPIC UNILATERAL SALPINGECTOMY;  Surgeon: Azucena Fallen, MD;  Location: [facility];  Service: Gynecology;  Laterality: Right;   TONSILLECTOMY      Current Medications: Current Meds  Medication Sig   acetaminophen (TYLENOL) 500 MG tablet Take 1 tablet (500 mg total) by mouth every 6 (six) hours as needed for mild pain or moderate pain.   amLODipine (NORVASC) 5 MG tablet Take 5 mg by mouth daily.   cholecalciferol (VITAMIN D) 1000 units tablet Take 1,000 Units by mouth daily.  folic acid (FOLVITE) 1 MG tablet Take 1 mg by mouth daily.   ibuprofen (ADVIL) 200 MG tablet Take 3 tablets (600 mg total) by mouth every 6 (six) hours as needed for moderate pain.   Probiotic Product (PROBIOTIC-10 PO) Take 1 capsule by mouth daily.     Allergies:   Penicillins and Sulfa antibiotics   Social History   Socioeconomic  History   Marital status: Married    Spouse name: Not on file   Number of children: Not on file   Years of education: Not on file   Highest education level: Not on file  Occupational History   Not on file  Tobacco Use   Smoking status: Former    Packs/day: 0.50    Years: 4.00    Total pack years: 2.00    Types: Cigarettes    Quit date: 07/18/1998    Years since quitting: 23.5   Smokeless tobacco: Never  Substance and Sexual Activity   Alcohol use: Not Currently    Comment: occasional   Drug use: No   Sexual activity: Yes    Birth control/protection: None    Comment: married since 12-2003  Other Topics Concern   Not on file  Social History Narrative   Not on file   Social Determinants of Health   Financial Resource Strain: Low Risk  (02/14/2022)   Overall Financial Resource Strain (CARDIA)    Difficulty of Paying Living Expenses: Not hard at all  Food Insecurity: No Food Insecurity (02/14/2022)   Hunger Vital Sign    Worried About Running Out of Food in the Last Year: Never true    Magness in the Last Year: Never true  Transportation Needs: No Transportation Needs (02/14/2022)   PRAPARE - Hydrologist (Medical): No    Lack of Transportation (Non-Medical): No  Physical Activity: Inactive (02/14/2022)   Exercise Vital Sign    Days of Exercise per Week: 0 days    Minutes of Exercise per Session: 0 min  Stress: Not on file  Social Connections: Not on file     Family History: The patient's family history includes Cancer in her father and maternal grandfather; Diabetes in her father, maternal grandfather, and mother; Heart disease in her mother; Hyperlipidemia in her father and mother; Hypertension in her brother, father, maternal grandfather, and mother; Parkinson's disease in her maternal grandmother.  ROS:   Please see the history of present illness.    (+) Stress (+) Fatigue (+) Insomnia (+) Palpitations (+) Snoring All other  systems reviewed and are negative.  EKGs/Labs/Other Studies Reviewed:    CTA Neck 11/09/15: FINDINGS: Aortic arch: Visualized aortic arch of normal caliber with normal branch pattern. No high-grade stenosis at the origin of the great vessels. Subclavian arteries patent bilaterally.   Right carotid system: Right common carotid artery patent from its origin to the bifurcation. Right ICA widely patent from the bifurcation to the skullbase. No stenosis, dissection, or vascular occlusion.   Left carotid system: Left common carotid artery patent from its origin to the bifurcation. Left ICA widely patent from the bifurcation to the skullbase. No dissection, stenosis, or vascular occlusion.   Vertebral arteries:Both vertebral arteries arise from the subclavian arteries. Vertebral arteries widely patent without stenosis, dissection, or occlusion.   Skeleton: No acute osseous abnormality. No worrisome lytic or blastic osseous lesions.   Other neck: Visualized lungs are clear. Visualized superior mediastinum demonstrates no acute abnormality. Approximately 15 mm hypodense nodule  within the left lobe of thyroid, indeterminate.   Enlarged right level 2a lymph node measures approximately 2 cm in short axis. Multiple additional right level 2B nodes present more posteriorly within the right neck measuring up to 12 mm. Associated inflammatory stranding within this region. Few additional right level 2/3 nodes present more inferiorly measuring up to 8 mm. Few level 5 B nodes also present, measuring up to 1 cm. Right-sided supraclavicular nodes measure up to 8 mm. Findings may be reactive in nature. Suppurative adenitis could have this appearance given the inflammatory stranding. The internal jugular veins not well evaluated on this exam due to timing of the contrast bolus, but are grossly patent. No significant left-sided adenopathy.   Probable retention cyst noted within the left sphenoid  sinus.   IMPRESSION: 1. Normal CTA of the neck. 2. Extensive bulky right-sided cervical adenopathy with associated inflammatory stranding, consistent with suppurative adenitis/reactive nodal disease. Clinical followup to resolution recommended. No jugular vein thrombosis identified on this exam, although evaluation somewhat limited by timing of the contrast bolus. 3. Approximate 15 mm heterogeneous left thyroid nodule, indeterminate. Further evaluation with dedicated thyroid ultrasound recommended. This could be performed on a nonemergent basis.  EKG: EKG was personally reviewed  02/14/22: Sinus tachycardia. Rate 115 bpm.  Recent Labs: No results found for requested labs within last 365 days.   Recent Lipid Panel No results found for: "CHOL", "TRIG", "HDL", "CHOLHDL", "VLDL", "LDLCALC", "LDLDIRECT"  Physical Exam:    VS:  BP 136/86 (BP Location: Right Arm, Patient Position: Sitting, Cuff Size: Large)   Pulse (!) 115   Ht '5\' 8"'$  (1.727 m)   Wt 274 lb 1.6 oz (124.3 kg)   BMI 41.68 kg/m  , BMI Body mass index is 41.68 kg/m. GENERAL:  Well appearing HEENT: Pupils equal round and reactive, fundi not visualized, oral mucosa unremarkable NECK:  No jugular venous distention, waveform within normal limits, carotid upstroke brisk and symmetric, no bruits, no thyromegaly LUNGS:  Clear to auscultation bilaterally HEART:  RRR.  PMI not displaced or sustained,S1 and S2 within normal limits, no S3, no S4, no clicks, no rubs, no murmurs ABD:  Flat, positive bowel sounds normal in frequency in pitch, no bruits, no rebound, no guarding, no midline pulsatile mass, no hepatomegaly, no splenomegaly EXT:  2 plus pulses throughout, no edema, no cyanosis no clubbing SKIN:  No rashes no nodules NEURO:  Cranial nerves II through XII grossly intact, motor grossly intact throughout PSYCH:  Cognitively intact, oriented to person place and time   ASSESSMENT/PLAN:    Tachycardia Heart rate was 115 bpm  in the office today.  She also notes palpitations.  She had labs recently with her PCP.  We will get a copy of these and make sure she had a CBC and TSH.  We will have her wear a 24-hour ambulatory monitor and get an echocardiogram prior to considering any medications.  Essential hypertension Blood pressure slightly above goal.  She is going to work on increasing her exercise to at least 150 minutes weekly.  She was also going to limit her sodium intake.  Continue amlodipine for now.  She would like to focus on trying not to add any additional medications if possible.   Screening for Secondary Hypertension:     02/14/2022    4:03 PM  Causes  Drugs/Herbals Screened     - Comments limits salt. occasional wine.  occasional cigars  Renovascular HTN N/A  Sleep Apnea Screened  Thyroid Disease Screened  Hyperaldosteronism N/A  Pheochromocytoma N/A  Cushing's Syndrome N/A    Relevant Labs/Studies:    Latest Ref Rng & Units 08/30/2018    3:43 PM 08/17/2018    6:46 PM 08/11/2018    3:57 PM  Basic Labs  Sodium 135 - 145 mmol/L 135  136    Potassium 3.5 - 5.1 mmol/L 3.8  3.7    Creatinine 0.44 - 1.00 mg/dL 0.70  0.65  0.57        Latest Ref Rng & Units 11/10/2015    3:51 AM 10/29/2012    3:20 PM  Thyroid   TSH 0.350 - 4.500 uIU/mL 4.447  0.860       Disposition:    FU with APP/PharmD in 1 month for the next 3 months.   FU with Karys Meckley C. Oval Linsey, MD, [facility] in 4 months.   Medication Adjustments/Labs and Tests Ordered: Current medicines are reviewed at length with the patient today.  Concerns regarding medicines are outlined above.   Orders Placed This Encounter  Procedures   24 hour blood pressure monitor   EKG 12-Lead   ECHOCARDIOGRAM COMPLETE   No orders of the defined types were placed in this encounter.   I,Mathew Stumpf,acting as a Education administrator for Skeet Latch, MD.,have documented all relevant documentation on the behalf of Skeet Latch, MD,as directed by  Skeet Latch, MD while in the presence of Skeet Latch, MD.  I, [place] Oval Linsey, MD have reviewed all documentation for this visit.  The documentation of the exam, diagnosis, procedures, and orders on 02/14/2022 are all accurate and complete.   Signed, Skeet Latch, MD  02/14/2022 5:26 PM    [place]

## 2022-02-14 NOTE — Assessment & Plan Note (Signed)
Heart rate was 115 bpm in the office today.  She also notes palpitations.  She had labs recently with her PCP.  We will get a copy of these and make sure she had a CBC and TSH.  We will have her wear a 24-hour ambulatory monitor and get an echocardiogram prior to considering any medications.

## 2022-02-14 NOTE — Patient Instructions (Signed)
Medication Instructions:  Your physician recommends that you continue on your current medications as directed. Please refer to the Current Medication list given to you today.    Labwork: NONE   Testing/Procedures: 51 HOUR BLOOD PRESSURE MONITOR   Your physician has requested that you have an echocardiogram. Echocardiography is a painless test that uses sound waves to create images of your heart. It provides your doctor with information about the size and shape of your heart and how well your heart's chambers and valves are working. This procedure takes approximately one hour. There are no restrictions for this procedure.   Follow-Up: 05/17/2022 2:00 PM WITH DR [facility]    Special Instructions:  MONITOR YOUR BLOOD PRESSURE TWICE A DAY, LOG IN THE BOOK PROVIDED. BRING THE BOOK AND YOUR BLOOD PRESSURE MACHINE TO YOUR FOLLOW UP   Exercise recommendations: The American Heart Association recommends 150 minutes of moderate intensity exercise weekly. Try 30 minutes of moderate intensity exercise 4-5 times per week. This could include walking, jogging, or swimming.    DASH Eating Plan DASH stands for "Dietary Approaches to Stop Hypertension." The DASH eating plan is a healthy eating plan that has been shown to reduce high blood pressure (hypertension). It may also reduce your risk for type 2 diabetes, heart disease, and stroke. The DASH eating plan may also help with weight loss. What are tips for following this plan?  General guidelines Avoid eating more than 2,300 mg (milligrams) of salt (sodium) a day. If you have hypertension, you may need to reduce your sodium intake to 1,500 mg a day. Limit alcohol intake to no more than 1 drink a day for nonpregnant women and 2 drinks a day for men. One drink equals 12 oz of beer, 5 oz of wine, or 1 oz of hard liquor. Work with your health care provider to maintain a healthy body weight or to lose weight. Ask what an ideal weight is for you. Get at  least 30 minutes of exercise that causes your heart to beat faster (aerobic exercise) most days of the week. Activities may include walking, swimming, or biking. Work with your health care provider or diet and nutrition specialist (dietitian) to adjust your eating plan to your individual calorie needs. Reading food labels  Check food labels for the amount of sodium per serving. Choose foods with less than 5 percent of the Daily Value of sodium. Generally, foods with less than 300 mg of sodium per serving fit into this eating plan. To find whole grains, look for the word "whole" as the first word in the ingredient list. Shopping Buy products labeled as "low-sodium" or "no salt added." Buy fresh foods. Avoid canned foods and premade or frozen meals. Cooking Avoid adding salt when cooking. Use salt-free seasonings or herbs instead of table salt or sea salt. Check with your health care provider or pharmacist before using salt substitutes. Do not fry foods. Cook foods using healthy methods such as baking, boiling, grilling, and broiling instead. Cook with heart-healthy oils, such as olive, canola, soybean, or sunflower oil. Meal planning Eat a balanced diet that includes: 5 or more servings of fruits and vegetables each day. At each meal, try to fill half of your plate with fruits and vegetables. Up to 6-8 servings of whole grains each day. Less than 6 oz of lean meat, poultry, or fish each day. A 3-oz serving of meat is about the same size as a deck of cards. One egg equals 1 oz. 2 servings of  low-fat dairy each day. A serving of nuts, seeds, or beans 5 times each week. Heart-healthy fats. Healthy fats called Omega-3 fatty acids are found in foods such as flaxseeds and coldwater fish, like sardines, salmon, and mackerel. Limit how much you eat of the following: Canned or prepackaged foods. Food that is high in trans fat, such as fried foods. Food that is high in saturated fat, such as fatty  meat. Sweets, desserts, sugary drinks, and other foods with added sugar. Full-fat dairy products. Do not salt foods before eating. Try to eat at least 2 vegetarian meals each week. Eat more home-cooked food and less restaurant, buffet, and fast food. When eating at a restaurant, ask that your food be prepared with less salt or no salt, if possible. What foods are recommended? The items listed may not be a complete list. Talk with your dietitian about what dietary choices are best for you. Grains Whole-grain or whole-wheat bread. Whole-grain or whole-wheat pasta. Brown rice. Modena Morrow. Bulgur. Whole-grain and low-sodium cereals. Pita bread. Low-fat, low-sodium crackers. Whole-wheat flour tortillas. Vegetables Fresh or frozen vegetables (raw, steamed, roasted, or grilled). Low-sodium or reduced-sodium tomato and vegetable juice. Low-sodium or reduced-sodium tomato sauce and tomato paste. Low-sodium or reduced-sodium canned vegetables. Fruits All fresh, dried, or frozen fruit. Canned fruit in natural juice (without added sugar). Meat and other protein foods Skinless chicken or [place]. Ground chicken or [place]. Pork with fat trimmed off. Fish and seafood. Egg whites. Dried beans, peas, or lentils. Unsalted nuts, nut butters, and seeds. Unsalted canned beans. Lean cuts of beef with fat trimmed off. Low-sodium, lean deli meat. Dairy Low-fat (1%) or fat-free (skim) milk. Fat-free, low-fat, or reduced-fat cheeses. Nonfat, low-sodium ricotta or cottage cheese. Low-fat or nonfat yogurt. Low-fat, low-sodium cheese. Fats and oils Soft margarine without trans fats. Vegetable oil. Low-fat, reduced-fat, or light mayonnaise and salad dressings (reduced-sodium). Canola, safflower, olive, soybean, and sunflower oils. Avocado. Seasoning and other foods Herbs. Spices. Seasoning mixes without salt. Unsalted popcorn and pretzels. Fat-free sweets. What foods are not recommended? The items listed may not be a  complete list. Talk with your dietitian about what dietary choices are best for you. Grains Baked goods made with fat, such as croissants, muffins, or some breads. Dry pasta or rice meal packs. Vegetables Creamed or fried vegetables. Vegetables in a cheese sauce. Regular canned vegetables (not low-sodium or reduced-sodium). Regular canned tomato sauce and paste (not low-sodium or reduced-sodium). Regular tomato and vegetable juice (not low-sodium or reduced-sodium). Angie Fava. Olives. Fruits Canned fruit in a light or heavy syrup. Fried fruit. Fruit in cream or butter sauce. Meat and other protein foods Fatty cuts of meat. Ribs. Fried meat. Berniece Salines. Sausage. Bologna and other processed lunch meats. Salami. Fatback. Hotdogs. Bratwurst. Salted nuts and seeds. Canned beans with added salt. Canned or smoked fish. Whole eggs or egg yolks. Chicken or [place] with skin. Dairy Whole or 2% milk, cream, and half-and-half. Whole or full-fat cream cheese. Whole-fat or sweetened yogurt. Full-fat cheese. Nondairy creamers. Whipped toppings. Processed cheese and cheese spreads. Fats and oils Butter. Stick margarine. Lard. Shortening. Ghee. Bacon fat. Tropical oils, such as coconut, palm kernel, or palm oil. Seasoning and other foods Salted popcorn and pretzels. Onion salt, garlic salt, seasoned salt, table salt, and sea salt. Worcestershire sauce. Tartar sauce. Barbecue sauce. Teriyaki sauce. Soy sauce, including reduced-sodium. Steak sauce. Canned and packaged gravies. Fish sauce. Oyster sauce. Cocktail sauce. Horseradish that you find on the shelf. Ketchup. Mustard. Meat flavorings and tenderizers. Bouillon cubes.  Hot sauce and Tabasco sauce. Premade or packaged marinades. Premade or packaged taco seasonings. Relishes. Regular salad dressings. Where to find more information: National Heart, Lung, and [place]: https://wilson-eaton.com/ American Heart Association: www.heart.org Summary The DASH eating plan is a  healthy eating plan that has been shown to reduce high blood pressure (hypertension). It may also reduce your risk for type 2 diabetes, heart disease, and stroke. With the DASH eating plan, you should limit salt (sodium) intake to 2,300 mg a day. If you have hypertension, you may need to reduce your sodium intake to 1,500 mg a day. When on the DASH eating plan, aim to eat more fresh fruits and vegetables, whole grains, lean proteins, low-fat dairy, and heart-healthy fats. Work with your health care provider or diet and nutrition specialist (dietitian) to adjust your eating plan to your individual calorie needs. This information is not intended to replace advice given to you by your health care provider. Make sure you discuss any questions you have with your health care provider. Document Released: 06/23/2011 Document Revised: 06/16/2017 Document Reviewed: 06/27/2016 Elsevier Patient Education  2020 [employer].

## 2022-02-14 NOTE — Assessment & Plan Note (Signed)
Blood pressure slightly above goal.  She is going to work on increasing her exercise to at least 150 minutes weekly.  She was also going to limit her sodium intake.  Continue amlodipine for now.  She would like to focus on trying not to add any additional medications if possible.

## 2022-02-18 ENCOUNTER — Telehealth (HOSPITAL_BASED_OUTPATIENT_CLINIC_OR_DEPARTMENT_OTHER): Payer: Self-pay | Admitting: Cardiovascular Disease

## 2022-02-18 NOTE — Telephone Encounter (Signed)
Called patient to confirm her echo. Patient stated that she didn't receive a call for the blood pressure monitor yet and wanted to know how long that will take.

## 2022-02-22 ENCOUNTER — Ambulatory Visit (INDEPENDENT_AMBULATORY_CARE_PROVIDER_SITE_OTHER): Payer: 59

## 2022-02-22 ENCOUNTER — Telehealth (HOSPITAL_BASED_OUTPATIENT_CLINIC_OR_DEPARTMENT_OTHER): Payer: Self-pay | Admitting: Cardiovascular Disease

## 2022-02-22 DIAGNOSIS — I10 Essential (primary) hypertension: Secondary | ICD-10-CM | POA: Diagnosis not present

## 2022-02-22 DIAGNOSIS — R000 Tachycardia, unspecified: Secondary | ICD-10-CM | POA: Diagnosis not present

## 2022-02-22 LAB — ECHOCARDIOGRAM COMPLETE
Area-P 1/2: 5.13 cm2
S' Lateral: 1.46 cm

## 2022-02-22 NOTE — Telephone Encounter (Signed)
Patient is here for her Echo and said she wants to see about getting her medication upped.

## 2022-02-22 NOTE — Telephone Encounter (Signed)
Patient in for today for echo  Blood pressure readings as follows  8/3 135-97 HR 83        156-106 HR 94 8/4  155-97 HR 103 8/5 151-110 HR 77       153-112 HR 96 8/6 132-94 HR 85 8/6 143-97 HR 117 8/7 139-102 HR 92 8/7 144-98 HR 95  Just started monitoring blood pressure as previously advised  Will forward to Dr Oval Linsey for review

## 2022-02-23 MED ORDER — CARVEDILOL 12.5 MG PO TABS: 12.5000 mg | ORAL_TABLET | Freq: Two times a day (BID) | ORAL | 3 refills | Status: DC

## 2022-02-23 NOTE — Telephone Encounter (Signed)
February 23, 2022 Skeet Latch, MD to [facility]      02/23/22  6:15 PM Recommending adding carvedilol 12.'5mg'$  bid.  Advised patient, verbalized understanding

## 2022-03-22 ENCOUNTER — Other Ambulatory Visit (HOSPITAL_BASED_OUTPATIENT_CLINIC_OR_DEPARTMENT_OTHER): Payer: Self-pay | Admitting: Cardiovascular Disease

## 2022-03-22 ENCOUNTER — Ambulatory Visit: Payer: 59 | Attending: Cardiovascular Disease

## 2022-03-22 DIAGNOSIS — R030 Elevated blood-pressure reading, without diagnosis of hypertension: Secondary | ICD-10-CM

## 2022-03-22 DIAGNOSIS — I10 Essential (primary) hypertension: Secondary | ICD-10-CM | POA: Diagnosis not present

## 2022-03-22 NOTE — Progress Notes (Unsigned)
24 hour ambulatory blood pressure monitor applied to patients left arm using adult large cuff.

## 2022-03-24 ENCOUNTER — Encounter (HOSPITAL_BASED_OUTPATIENT_CLINIC_OR_DEPARTMENT_OTHER): Payer: Self-pay

## 2022-04-20 ENCOUNTER — Telehealth (HOSPITAL_BASED_OUTPATIENT_CLINIC_OR_DEPARTMENT_OTHER): Payer: Self-pay | Admitting: *Deleted

## 2022-04-20 NOTE — Telephone Encounter (Signed)
-----   Message from Skeet Latch, MD sent at 04/20/2022  4:17 AM EDT ----- Study shows that her BP is uncontrolled at home.  This puts her at unnecessary risk of cardiovascular and kidney disease.  Recommend adding HCTZ '25mg'$  and checking a BMP in a week.  Track BP at home and bring to f/u.

## 2022-04-20 NOTE — Telephone Encounter (Signed)
Left message to call back  

## 2022-04-25 NOTE — Telephone Encounter (Signed)
Spoke with patient and she was not taking the Carvedilol and Amlodipine together when she wore the blood pressure monitor. Stated she thought she was supposed to stop Amlodipine when she started Carvedilol. Her blood pressure started going up and and now she has been on both for about 1 & 1/2 weeks. She does not check her blood pressure daily Advised to monitor blood pressure twice a day for 1 week and call with updated readings.

## 2022-04-26 NOTE — Telephone Encounter (Signed)
Nicole Latch, Nicole Alexander  Nicole Hansen, LPN OK thanks

## 2022-05-06 NOTE — Telephone Encounter (Signed)
May be worthwhile to call her next week to check on pressures. She does have upcoming OV 05/17/22 with TR.   Loel Dubonnet, NP

## 2022-05-10 NOTE — Telephone Encounter (Signed)
Left message to call back  

## 2022-05-17 ENCOUNTER — Ambulatory Visit (HOSPITAL_BASED_OUTPATIENT_CLINIC_OR_DEPARTMENT_OTHER): Payer: 59 | Admitting: Cardiovascular Disease

## 2022-06-08 ENCOUNTER — Ambulatory Visit (HOSPITAL_BASED_OUTPATIENT_CLINIC_OR_DEPARTMENT_OTHER): Payer: 59 | Admitting: Cardiovascular Disease

## 2022-06-08 ENCOUNTER — Encounter (HOSPITAL_BASED_OUTPATIENT_CLINIC_OR_DEPARTMENT_OTHER): Payer: Self-pay | Admitting: Cardiovascular Disease

## 2022-06-08 VITALS — BP 122/86 | HR 84 | Ht 68.0 in | Wt 270.8 lb

## 2022-06-08 DIAGNOSIS — E7800 Pure hypercholesterolemia, unspecified: Secondary | ICD-10-CM

## 2022-06-08 DIAGNOSIS — R000 Tachycardia, unspecified: Secondary | ICD-10-CM | POA: Diagnosis not present

## 2022-06-08 DIAGNOSIS — I10 Essential (primary) hypertension: Secondary | ICD-10-CM

## 2022-06-08 HISTORY — DX: Pure hypercholesterolemia, unspecified: E78.00

## 2022-06-08 MED ORDER — CARVEDILOL 25 MG PO TABS: 25.0000 mg | ORAL_TABLET | Freq: Two times a day (BID) | ORAL | 3 refills | Status: DC

## 2022-06-08 NOTE — Assessment & Plan Note (Signed)
She was recently started on atorvastatin by her PCP.  Agree with this choice.

## 2022-06-08 NOTE — Progress Notes (Signed)
[facility] Follow-up Assessment:    Date:  06/08/2022   ID:  Nicole Alexander, DOB [birth year], MRN [id number]  PCP:  Donald Prose, MD  Cardiologist:  None  Nephrologist:  Referring MD: Donald Prose, MD   CC: Hypertension  History of Present Illness:    Nicole Alexander is a 46 y.o. female with a hx of hypertension, here for follow-up. She was initially seen in the [facility] on 02/14/2022. Referral notes from Dr. Garwin Brothers personally reviewed. She saw Dr. Garwin Brothers 09/2021 and her BP was 144/100 on amlodipine, so she was referred to the [facility].  At her last visit, she stated that she did not regularly check her BP at home, but reported that it was always high. She reported palpitations and her HR was at 115 bpm per her EKG. She additionally developed right knee edema, but her swelling had improved. She was given a 24-hour ambulatory monitor and an echocardiogram was ordered prior to medication consideration. Her BP monitor showed uncontrolled BP readings at home with an average of 145/96. It was recommended to start HCTZ 25 mg. She had an echo on 02/22/2022 which revealed LVEF 70-75% with hyperdynamic ventricular function and mild LVH. We recommended adding carvedilol 12.5 mg. She noted that she was not taking amlodipine while wearing her monitor.   Today, she is feeling pretty good. She has been checking her BP at home, and she reports that it fluctuates. However, it has improved with both medications, amlodipine and carvedilol. Her heart rate in clinic today was 84 bpm. When she checks at home, they are in the low-mid 90s. She denies feeling any palpitations. For the past 2 weeks she has not been engaging in physical exercise since returning from her vacation. Previously she was walking around the neighborhood routinely. During her walks she was still aware of her heart rates increasing. She does feel like this limits her exercise.  Regarding her diet she mostly cooks at home, and uses an air fryer occasionally. She has also switched to drinking oat milk. She is compliant with atorvastatin for her cholesterol. She denies any palpitations, chest pain, shortness of breath, or peripheral edema. No lightheadedness, headaches, syncope, orthopnea, or PND.    Past Medical History:  Diagnosis Date   Cervical lymphadenitis 11/25/2015   Essential hypertension 02/14/2022   Pure hypercholesterolemia 06/08/2022   Ruptured right tubal ectopic pregnancy causing hemoperitoneum 08/30/2018   Tachycardia 02/14/2022    Past Surgical History:  Procedure Laterality Date   BREAST SURGERY     breast reduction   CHOLECYSTECTOMY N/A 11/28/2012   Procedure: LAPAROSCOPIC CHOLECYSTECTOMY WITH INTRAOPERATIVE CHOLANGIOGRAM;  Surgeon: Zenovia Jarred, MD;  Location: [facility];  Service: General;  Laterality: N/A;   DIAGNOSTIC LAPAROSCOPY WITH REMOVAL OF ECTOPIC PREGNANCY N/A 08/30/2018   Procedure: DIAGNOSTIC LAPAROSCOPY WITH REMOVAL OF ECTOPIC PREGNANCY;  Surgeon: Azucena Fallen, MD;  Location: [facility];  Service: Gynecology;  Laterality: N/A;   LAPAROSCOPIC UNILATERAL SALPINGECTOMY Right 08/30/2018   Procedure: LAPAROSCOPIC UNILATERAL SALPINGECTOMY;  Surgeon: Azucena Fallen, MD;  Location: [facility];  Service: Gynecology;  Laterality: Right;   TONSILLECTOMY      Current Medications: Current Meds  Medication Sig   acetaminophen (TYLENOL) 500 MG tablet Take 1 tablet (500 mg total) by mouth every 6 (six) hours as needed for mild pain or moderate pain.   amLODipine (NORVASC) 5 MG tablet Take 5 mg by mouth daily.   atorvastatin (LIPITOR) 10 MG tablet Take 10 mg by mouth  daily.   cholecalciferol (VITAMIN D) 1000 units tablet Take 1,000 Units by mouth daily.   folic acid (FOLVITE) 1 MG tablet Take 1 mg by mouth daily.   ibuprofen (ADVIL) 200 MG tablet Take 3 tablets (600 mg total) by mouth every 6 (six) hours as needed for moderate pain.   Probiotic Product  (PROBIOTIC-10 PO) Take 1 capsule by mouth daily.   [DISCONTINUED] carvedilol (COREG) 12.5 MG tablet Take 1 tablet (12.5 mg total) by mouth 2 (two) times daily.     Allergies:   Penicillins and Sulfa antibiotics   Social History   Socioeconomic History   Marital status: Married    Spouse name: Not on file   Number of children: Not on file   Years of education: Not on file   Highest education level: Not on file  Occupational History   Not on file  Tobacco Use   Smoking status: Former    Packs/day: 0.50    Years: 4.00    Total pack years: 2.00    Types: Cigarettes    Quit date: 07/18/1998    Years since quitting: 23.9   Smokeless tobacco: Never  Substance and Sexual Activity   Alcohol use: Not Currently    Comment: occasional   Drug use: No   Sexual activity: Yes    Birth control/protection: None    Comment: married since 12-2003  Other Topics Concern   Not on file  Social History Narrative   Not on file   Social Determinants of Health   Financial Resource Strain: Low Risk  (02/14/2022)   Overall Financial Resource Strain (CARDIA)    Difficulty of Paying Living Expenses: Not hard at all  Food Insecurity: No Food Insecurity (02/14/2022)   Hunger Vital Sign    Worried About Running Out of Food in the Last Year: Never true    [place] in the Last Year: Never true  Transportation Needs: No Transportation Needs (02/14/2022)   PRAPARE - Hydrologist (Medical): No    Lack of Transportation (Non-Medical): No  Physical Activity: Inactive (02/14/2022)   Exercise Vital Sign    Days of Exercise per Week: 0 days    Minutes of Exercise per Session: 0 min  Stress: Not on file  Social Connections: Not on file     Family History: The patient's family history includes Cancer in her father and maternal grandfather; Diabetes in her father, maternal grandfather, and mother; Heart disease in her mother; Hyperlipidemia in her father and mother;  Hypertension in her brother, father, maternal grandfather, and mother; Parkinson's disease in her maternal grandmother.  ROS:   Please see the history of present illness.    All other systems reviewed and are negative.  EKGs/Labs/Other Studies Reviewed:    24-HR Blood Pressure Monitor (03/22/2022):  24 Hour Ambulatory Blood Pressure Report   Overall Average BP: 145/96 Overall Average HR: 80   Awake Average BP: 147/99 Awake Average HR: 81  Asleep Average BP: 125/79 Asleep Average HR: 78  White Coat Period: Max 151/107, mean 150/104   Impressions: Uncontrolled hypertension with an appropriate nocturnal dip.   TTE 02/22/2022: IMPRESSIONS    1. Left ventricular ejection fraction, by estimation, is 70 to 75%. The  left ventricle has hyperdynamic function with no significant intracavitary  gradient on this exam. The left ventricle has no regional wall motion  abnormalities, appears grossly  normal despite image quality. There is mild left ventricular hypertrophy.  Left ventricular diastolic  parameters were normal.   2. Right ventricular systolic function is normal. The right ventricular  size is normal. Tricuspid regurgitation signal is inadequate for assessing  PA pressure.   3. The mitral valve is normal in structure. No evidence of mitral valve  regurgitation. No evidence of mitral stenosis.   4. The aortic valve is tricuspid. Aortic valve regurgitation is not  visualized. No aortic stenosis is present.   5. The inferior vena cava is normal in size with greater than 50%  respiratory variability, suggesting right atrial pressure of 3 mmHg.   CTA Neck 11/09/15: FINDINGS: Aortic arch: Visualized aortic arch of normal caliber with normal branch pattern. No high-grade stenosis at the origin of the great vessels. Subclavian arteries patent bilaterally.   Right carotid system: Right common carotid artery patent from its origin to the bifurcation. Right ICA widely patent from  the bifurcation to the skullbase. No stenosis, dissection, or vascular occlusion.   Left carotid system: Left common carotid artery patent from its origin to the bifurcation. Left ICA widely patent from the bifurcation to the skullbase. No dissection, stenosis, or vascular occlusion.   Vertebral arteries:Both vertebral arteries arise from the subclavian arteries. Vertebral arteries widely patent without stenosis, dissection, or occlusion.   Skeleton: No acute osseous abnormality. No worrisome lytic or blastic osseous lesions.   Other neck: Visualized lungs are clear. Visualized superior mediastinum demonstrates no acute abnormality. Approximately 15 mm hypodense nodule within the left lobe of thyroid, indeterminate.   Enlarged right level 2a lymph node measures approximately 2 cm in short axis. Multiple additional right level 2B nodes present more posteriorly within the right neck measuring up to 12 mm. Associated inflammatory stranding within this region. Few additional right level 2/3 nodes present more inferiorly measuring up to 8 mm. Few level 5 B nodes also present, measuring up to 1 cm. Right-sided supraclavicular nodes measure up to 8 mm. Findings may be reactive in nature. Suppurative adenitis could have this appearance given the inflammatory stranding. The internal jugular veins not well evaluated on this exam due to timing of the contrast bolus, but are grossly patent. No significant left-sided adenopathy.   Probable retention cyst noted within the left sphenoid sinus.   IMPRESSION: 1. Normal CTA of the neck. 2. Extensive bulky right-sided cervical adenopathy with associated inflammatory stranding, consistent with suppurative adenitis/reactive nodal disease. Clinical followup to resolution recommended. No jugular vein thrombosis identified on this exam, although evaluation somewhat limited by timing of the contrast bolus. 3. Approximate 15 mm heterogeneous left  thyroid nodule, indeterminate. Further evaluation with dedicated thyroid ultrasound recommended. This could be performed on a nonemergent basis.  EKG: EKG was personally reviewed  06/08/22: EKG was not ordered.  02/14/22: Sinus tachycardia. Rate 115 bpm.  Recent Labs: No results found for requested labs within last 365 days.   Recent Lipid Panel No results found for: "CHOL", "TRIG", "HDL", "CHOLHDL", "VLDL", "LDLCALC", "LDLDIRECT"  Physical Exam:    VS:  BP 122/86 (BP Location: Left Arm, Patient Position: Sitting, Cuff Size: Large)   Pulse 84   Ht '5\' 8"'$  (1.727 m)   Wt 270 lb 12.8 oz (122.8 kg)   SpO2 99%   BMI 41.17 kg/m  , BMI Body mass index is 41.17 kg/m. GENERAL:  Well appearing HEENT: Pupils equal round and reactive, fundi not visualized, oral mucosa unremarkable NECK:  No jugular venous distention, waveform within normal limits, carotid upstroke brisk and symmetric, no bruits, no thyromegaly LUNGS:  Clear to auscultation bilaterally HEART:  RRR.  PMI not displaced or sustained,S1 and S2 within normal limits, no S3, no S4, no clicks, no rubs, no murmurs ABD:  Flat, positive bowel sounds normal in frequency in pitch, no bruits, no rebound, no guarding, no midline pulsatile mass, no hepatomegaly, no splenomegaly EXT:  2 plus pulses throughout, no edema, no cyanosis no clubbing SKIN:  No rashes no nodules NEURO:  Cranial nerves II through XII grossly intact, motor grossly intact throughout PSYCH:  Cognitively intact, oriented to person place and time  ASSESSMENT/PLAN:    Essential hypertension Blood pressure still above her goal of <130/80.  She hasn't been checking it regularly at home but notes that it was good when she saw her PCP.  Continue amlodipine.  She notes that her heart rate still races some when she exercises and this limits her exercise capacity.  We will increase carvedilol to 25 mg twice daily.  Keep working on diet and exercise.  Pure  hypercholesterolemia She was recently started on atorvastatin by her PCP.  Agree with this choice.  Tachycardia She had inappropriate sinus tachycardia.  Increasing carvedilol as above.  She notes improvement in her symptoms since starting it.   Screening for Secondary Hypertension:     02/14/2022    4:03 PM  Causes  Drugs/Herbals Screened     - Comments limits salt. occasional wine.  occasional cigars  Renovascular HTN N/A  Sleep Apnea Screened  Thyroid Disease Screened  Hyperaldosteronism N/A  Pheochromocytoma N/A  Cushing's Syndrome N/A    Relevant Labs/Studies:    Latest Ref Rng & Units 08/30/2018    3:43 PM 08/17/2018    6:46 PM 08/11/2018    3:57 PM  Basic Labs  Sodium 135 - 145 mmol/L 135  136    Potassium 3.5 - 5.1 mmol/L 3.8  3.7    Creatinine 0.44 - 1.00 mg/dL 0.70  0.65  0.57        Latest Ref Rng & Units 11/10/2015    3:51 AM 10/29/2012    3:20 PM  Thyroid   TSH 0.350 - 4.500 uIU/mL 4.447  0.860       Disposition:    FU with Raidyn Breiner C. Oval Linsey, MD, [facility] in 2-3 months.  Medication Adjustments/Labs and Tests Ordered: Current medicines are reviewed at length with the patient today.  Concerns regarding medicines are outlined above.   No orders of the defined types were placed in this encounter.  Meds ordered this encounter  Medications   carvedilol (COREG) 25 MG tablet    Sig: Take 1 tablet (25 mg total) by mouth 2 (two) times daily.    Dispense:  180 tablet    Refill:  3    NEW DOSE, D/C 12.5 MG RX   I,Mitra Faeizi,acting as a scribe for [place], MD.,have documented all relevant documentation on the behalf of Skeet Latch, MD,as directed by  Skeet Latch, MD while in the presence of Skeet Latch, MD.   I, [place] Oval Linsey, MD have reviewed all documentation for this visit.  The documentation of the exam, diagnosis, procedures, and orders on 06/08/2022 are all accurate and complete.  Signed, Skeet Latch, MD  06/08/2022  10:28 AM    [place]

## 2022-06-08 NOTE — Assessment & Plan Note (Signed)
She had inappropriate sinus tachycardia.  Increasing carvedilol as above.  She notes improvement in her symptoms since starting it.

## 2022-06-08 NOTE — Assessment & Plan Note (Signed)
Blood pressure still above her goal of <130/80.  She hasn't been checking it regularly at home but notes that it was good when she saw her PCP.  Continue amlodipine.  She notes that her heart rate still races some when she exercises and this limits her exercise capacity.  We will increase carvedilol to 25 mg twice daily.  Keep working on diet and exercise.

## 2022-06-08 NOTE — Patient Instructions (Addendum)
Medication Instructions:  INCREASE YOUR CARVEDILOL TO 25 MG TWICE A DAY   Labwork: NONE  Testing/Procedures: NONE  Follow-Up:  09/05/2022 9:15 am WITH DR [facility]   Any Other Special Instructions Will Be Listed Below (If Applicable).  MONITOR YOUR BLOOD PRESSURE ONE TO TWO TIMES A AND BRING YOUR READINGS TO YOUR FOLLOW UP APPOINTMENT

## 2022-08-09 NOTE — Telephone Encounter (Signed)
Dr Oval Linsey saw patient 05/2022

## 2022-09-05 ENCOUNTER — Ambulatory Visit (HOSPITAL_BASED_OUTPATIENT_CLINIC_OR_DEPARTMENT_OTHER): Payer: 59 | Admitting: Cardiovascular Disease

## 2022-09-05 NOTE — Progress Notes (Incomplete)
Advanced Hypertension Clinic Follow-up Assessment:    Date:  09/05/2022   ID:  Nicole Alexander, DOB [birth year], MRN [id number]  PCP:  Donald Prose, MD  Cardiologist:  None  Nephrologist:  Referring MD: Donald Prose, MD   CC: Hypertension  History of Present Illness:    Nicole Alexander is a 47 y.o. female with a hx of hypertension, here for follow-up. She was initially seen in the [facility] on 02/14/2022. Referral notes from Dr. Garwin Brothers personally reviewed. She saw Dr. Garwin Brothers 09/2021 and her BP was 144/100 on amlodipine, so she was referred to the [facility]pert[facility] Clinic.  She complained of palpitations. She was given a 24-hour ambulatory monitor and an echocardiogram was ordered prior to medication consideration. Her BP monitor showed uncontrolled BP readings at home with an average of 145/96. It was recommended to start HCTZ 25 mg. She had an echo on 02/22/2022 which revealed LVEF 70-75% with hyperdynamic ventricular function and mild LVH. We recommended adding carvedilol 12.5 mg. She noted that she was not taking amlodipine while wearing her monitor.   At the last visit blood pressures were still slightly above goal but were better controlled than prior. Carvedilol was increased as she also had inappropriate tachycardia.   Past Medical History:  Diagnosis Date   Cervical lymphadenitis 11/25/2015   Essential hypertension 02/14/2022   Pure hypercholesterolemia 06/08/2022   Ruptured right tubal ectopic pregnancy causing hemoperitoneum 08/30/2018   Tachycardia 02/14/2022    Past Surgical History:  Procedure Laterality Date   BREAST SURGERY     breast reduction   CHOLECYSTECTOMY N/A 11/28/2012   Procedure: LAPAROSCOPIC CHOLECYSTECTOMY WITH INTRAOPERATIVE CHOLANGIOGRAM;  Surgeon: Zenovia Jarred, MD;  Location: [place];  Service: General;  Laterality: N/A;   DIAGNOSTIC LAPAROSCOPY WITH REMOVAL OF ECTOPIC PREGNANCY N/A 08/30/2018   Procedure: DIAGNOSTIC  LAPAROSCOPY WITH REMOVAL OF ECTOPIC PREGNANCY;  Surgeon: Azucena Fallen, MD;  Location: [facility];  Service: Gynecology;  Laterality: N/A;   LAPAROSCOPIC UNILATERAL SALPINGECTOMY Right 08/30/2018   Procedure: LAPAROSCOPIC UNILATERAL SALPINGECTOMY;  Surgeon: Azucena Fallen, MD;  Location: [facility];  Service: Gynecology;  Laterality: Right;   TONSILLECTOMY      Current Medications: No outpatient medications have been marked as taking for the 09/05/22 encounter (Appointment) with Skeet Latch, MD.     Allergies:   Penicillins and Sulfa antibiotics   Social History   Socioeconomic History   Marital status: Married    Spouse name: Not on file   Number of children: Not on file   Years of education: Not on file   Highest education level: Not on file  Occupational History   Not on file  Tobacco Use   Smoking status: Former    Packs/day: 0.50    Years: 4.00    Total pack years: 2.00    Types: Cigarettes    Quit date: 07/18/1998    Years since quitting: 24.1   Smokeless tobacco: Never  Substance and Sexual Activity   Alcohol use: Not Currently    Comment: occasional   Drug use: No   Sexual activity: Yes    Birth control/protection: None    Comment: married since 12-2003  Other Topics Concern   Not on file  Social History Narrative   Not on file   Social Determinants of Health   Financial Resource Strain: Low Risk  (02/14/2022)   Overall Financial Resource Strain (CARDIA)    Difficulty of Paying Living Expenses: Not hard at all  Food Insecurity: No Food  Insecurity (02/14/2022)   Hunger Vital Sign    Worried About Running Out of Food in the Last Year: Never true    [place] in the Last Year: Never true  Transportation Needs: No Transportation Needs (02/14/2022)   PRAPARE - Hydrologist (Medical): No    Lack of Transportation (Non-Medical): No  Physical Activity: Inactive (02/14/2022)   Exercise Vital Sign    Days of Exercise per Week: 0 days     Minutes of Exercise per Session: 0 min  Stress: Not on file  Social Connections: Not on file     Family History: The patient's family history includes Cancer in her father and maternal grandfather; Diabetes in her father, maternal grandfather, and mother; Heart disease in her mother; Hyperlipidemia in her father and mother; Hypertension in her brother, father, maternal grandfather, and mother; Parkinson's disease in her maternal grandmother.  ROS:   Please see the history of present illness.    All other systems reviewed and are negative.  EKGs/Labs/Other Studies Reviewed:    24-HR Blood Pressure Monitor (03/22/2022):  24 Hour Ambulatory Blood Pressure Report   Overall Average BP: 145/96 Overall Average HR: 80   Awake Average BP: 147/99 Awake Average HR: 81  Asleep Average BP: 125/79 Asleep Average HR: 78  White Coat Period: Max 151/107, mean 150/104   Impressions: Uncontrolled hypertension with an appropriate nocturnal dip.   TTE 02/22/2022: IMPRESSIONS    1. Left ventricular ejection fraction, by estimation, is 70 to 75%. The  left ventricle has hyperdynamic function with no significant intracavitary  gradient on this exam. The left ventricle has no regional wall motion  abnormalities, appears grossly  normal despite image quality. There is mild left ventricular hypertrophy.  Left ventricular diastolic parameters were normal.   2. Right ventricular systolic function is normal. The right ventricular  size is normal. Tricuspid regurgitation signal is inadequate for assessing  PA pressure.   3. The mitral valve is normal in structure. No evidence of mitral valve  regurgitation. No evidence of mitral stenosis.   4. The aortic valve is tricuspid. Aortic valve regurgitation is not  visualized. No aortic stenosis is present.   5. The inferior vena cava is normal in size with greater than 50%  respiratory variability, suggesting right atrial pressure of 3 mmHg.   CTA Neck  11/09/15: FINDINGS: Aortic arch: Visualized aortic arch of normal caliber with normal branch pattern. No high-grade stenosis at the origin of the great vessels. Subclavian arteries patent bilaterally.   Right carotid system: Right common carotid artery patent from its origin to the bifurcation. Right ICA widely patent from the bifurcation to the skullbase. No stenosis, dissection, or vascular occlusion.   Left carotid system: Left common carotid artery patent from its origin to the bifurcation. Left ICA widely patent from the bifurcation to the skullbase. No dissection, stenosis, or vascular occlusion.   Vertebral arteries:Both vertebral arteries arise from the subclavian arteries. Vertebral arteries widely patent without stenosis, dissection, or occlusion.   Skeleton: No acute osseous abnormality. No worrisome lytic or blastic osseous lesions.   Other neck: Visualized lungs are clear. Visualized superior mediastinum demonstrates no acute abnormality. Approximately 15 mm hypodense nodule within the left lobe of thyroid, indeterminate.   Enlarged right level 2a lymph node measures approximately 2 cm in short axis. Multiple additional right level 2B nodes present more posteriorly within the right neck measuring up to 12 mm. Associated inflammatory stranding within this region. Few  additional right level 2/3 nodes present more inferiorly measuring up to 8 mm. Few level 5 B nodes also present, measuring up to 1 cm. Right-sided supraclavicular nodes measure up to 8 mm. Findings may be reactive in nature. Suppurative adenitis could have this appearance given the inflammatory stranding. The internal jugular veins not well evaluated on this exam due to timing of the contrast bolus, but are grossly patent. No significant left-sided adenopathy.   Probable retention cyst noted within the left sphenoid sinus.   IMPRESSION: 1. Normal CTA of the neck. 2. Extensive bulky right-sided  cervical adenopathy with associated inflammatory stranding, consistent with suppurative adenitis/reactive nodal disease. Clinical followup to resolution recommended. No jugular vein thrombosis identified on this exam, although evaluation somewhat limited by timing of the contrast bolus. 3. Approximate 15 mm heterogeneous left thyroid nodule, indeterminate. Further evaluation with dedicated thyroid ultrasound recommended. This could be performed on a nonemergent basis.  EKG: EKG was personally reviewed  06/08/22: EKG was not ordered.  02/14/22: Sinus tachycardia. Rate 115 bpm.  Recent Labs: No results found for requested labs within last 365 days.   Recent Lipid Panel No results found for: "CHOL", "TRIG", "HDL", "CHOLHDL", "VLDL", "LDLCALC", "LDLDIRECT"  Physical Exam:    VS:  There were no vitals taken for this visit. , BMI There is no height or weight on file to calculate BMI. GENERAL:  Well appearing HEENT: Pupils equal round and reactive, fundi not visualized, oral mucosa unremarkable NECK:  No jugular venous distention, waveform within normal limits, carotid upstroke brisk and symmetric, no bruits, no thyromegaly LUNGS:  Clear to auscultation bilaterally HEART:  RRR.  PMI not displaced or sustained,S1 and S2 within normal limits, no S3, no S4, no clicks, no rubs, no murmurs ABD:  Flat, positive bowel sounds normal in frequency in pitch, no bruits, no rebound, no guarding, no midline pulsatile mass, no hepatomegaly, no splenomegaly EXT:  2 plus pulses throughout, no edema, no cyanosis no clubbing SKIN:  No rashes no nodules NEURO:  Cranial nerves II through XII grossly intact, motor grossly intact throughout PSYCH:  Cognitively intact, oriented to person place and time  ASSESSMENT/PLAN:    No problem-specific Assessment & Plan notes found for this encounter.    Screening for Secondary Hypertension:     02/14/2022    4:03 PM  Causes  Drugs/Herbals Screened     - Comments  limits salt. occasional wine.  occasional cigars  Renovascular HTN N/A  Sleep Apnea Screened  Thyroid Disease Screened  Hyperaldosteronism N/A  Pheochromocytoma N/A  Cushing's Syndrome N/A    Relevant Labs/Studies:    Latest Ref Rng & Units 08/30/2018    3:43 PM 08/17/2018    6:46 PM 08/11/2018    3:57 PM  Basic Labs  Sodium 135 - 145 mmol/L 135  136    Potassium 3.5 - 5.1 mmol/L 3.8  3.7    Creatinine 0.44 - 1.00 mg/dL 0.70  0.65  0.57        Latest Ref Rng & Units 11/10/2015    3:51 AM 10/29/2012    3:20 PM  Thyroid   TSH 0.350 - 4.500 uIU/mL 4.447  0.860       Disposition:    FU with Tiffany C. Oval Linsey, MD, [facility] in 2-3 months.  Medication Adjustments/Labs and Tests Ordered: Current medicines are reviewed at length with the patient today.  Concerns regarding medicines are outlined above.   No orders of the defined types were placed in this encounter.  No  orders of the defined types were placed in this encounter.  I,Mitra Faeizi,acting as a Education administrator for [place], MD.,have documented all relevant documentation on the behalf of Skeet Latch, MD,as directed by  Skeet Latch, MD while in the presence of Skeet Latch, MD.   I, [place] Oval Linsey, MD have reviewed all documentation for this visit.  The documentation of the exam, diagnosis, procedures, and orders on 09/05/2022 are all accurate and complete.  Waynetta Pean  09/05/2022 7:34 AM    [facility]

## 2022-09-19 NOTE — Progress Notes (Incomplete)
Advanced Hypertension Clinic Follow-up Assessment:    Date:  09/19/2022   ID:  Nicole Alexander, DOB 01-29-1976, MRN [id number]  PCP:  Donald Prose, MD  Cardiologist:  None  Nephrologist:  Referring MD: Donald Prose, MD   CC: Hypertension  History of Present Illness:    Nicole Alexander is a 47 y.o. female with a hx of hypertension, here for follow-up. She was initially seen in the Advanced Hypertension Clinic on 02/14/2022. Referral notes from Dr. Garwin Brothers personally reviewed. She saw Dr. Garwin Brothers 09/2021 and her BP was 144/100 on amlodipine, so she was referred to the Advanced Hypertension Clinic.  At her last visit, she stated that she did not regularly check her BP at home, but reported that it was always high. She reported palpitations and her HR was at 115 bpm per her EKG. She additionally developed right knee edema, but her swelling had improved. She was given a 24-hour ambulatory monitor and an echocardiogram was ordered prior to medication consideration. Her BP monitor showed uncontrolled BP readings at home with an average of 145/96. It was recommended to start HCTZ 25 mg. She had an echo on 02/22/2022 which revealed LVEF 70-75% with hyperdynamic ventricular function and mild LVH. We recommended adding carvedilol 12.5 mg. She noted that she was not taking amlodipine while wearing her monitor.   Today, she is feeling pretty good. She has been checking her BP at home, and she reports that it fluctuates. However, it has improved with both medications, amlodipine and carvedilol. Her heart rate in clinic today was 84 bpm. When she checks at home, they are in the low-mid 90s. She denies feeling any palpitations. For the past 2 weeks she has not been engaging in physical exercise since returning from her vacation. Previously she was walking around the neighborhood routinely. During her walks she was still aware of her heart rates increasing. She does feel like this limits her exercise.  Regarding her diet she mostly cooks at home, and uses an air fryer occasionally. She has also switched to drinking oat milk. She is compliant with atorvastatin for her cholesterol. She denies any palpitations, chest pain, shortness of breath, or peripheral edema. No lightheadedness, headaches, syncope, orthopnea, or PND.    Past Medical History:  Diagnosis Date   Cervical lymphadenitis 11/25/2015   Essential hypertension 02/14/2022   Pure hypercholesterolemia 06/08/2022   Ruptured right tubal ectopic pregnancy causing hemoperitoneum 08/30/2018   Tachycardia 02/14/2022    Past Surgical History:  Procedure Laterality Date   BREAST SURGERY     breast reduction   CHOLECYSTECTOMY N/A 11/28/2012   Procedure: LAPAROSCOPIC CHOLECYSTECTOMY WITH INTRAOPERATIVE CHOLANGIOGRAM;  Surgeon: Zenovia Jarred, MD;  Location: [place];  Service: General;  Laterality: N/A;   DIAGNOSTIC LAPAROSCOPY WITH REMOVAL OF ECTOPIC PREGNANCY N/A 08/30/2018   Procedure: DIAGNOSTIC LAPAROSCOPY WITH REMOVAL OF ECTOPIC PREGNANCY;  Surgeon: Azucena Fallen, MD;  Location: [facility];  Service: Gynecology;  Laterality: N/A;   LAPAROSCOPIC UNILATERAL SALPINGECTOMY Right 08/30/2018   Procedure: LAPAROSCOPIC UNILATERAL SALPINGECTOMY;  Surgeon: Azucena Fallen, MD;  Location: [facility];  Service: Gynecology;  Laterality: Right;   TONSILLECTOMY      Current Medications: No outpatient medications have been marked as taking for the 09/22/22 encounter (Appointment) with Skeet Latch, MD.     Allergies:   Penicillins and Sulfa antibiotics   Social History   Socioeconomic History   Marital status: Married    Spouse name: Not on file   Number of children: Not on file  Years of education: Not on file   Highest education level: Not on file  Occupational History   Not on file  Tobacco Use   Smoking status: Former    Packs/day: 0.50    Years: 4.00    Total pack years: 2.00    Types: Cigarettes    Quit date: 07/18/1998    Years since  quitting: 24.1   Smokeless tobacco: Never  Substance and Sexual Activity   Alcohol use: Not Currently    Comment: occasional   Drug use: No   Sexual activity: Yes    Birth control/protection: None    Comment: married since 12-2003  Other Topics Concern   Not on file  Social History Narrative   Not on file   Social Determinants of Health   Financial Resource Strain: Low Risk  (02/14/2022)   Overall Financial Resource Strain (CARDIA)    Difficulty of Paying Living Expenses: Not hard at all  Food Insecurity: No Food Insecurity (02/14/2022)   Hunger Vital Sign    Worried About Running Out of Food in the Last Year: Never true    Ran Out of Food in the Last Year: Never true  Transportation Needs: No Transportation Needs (02/14/2022)   PRAPARE - Hydrologist (Medical): No    Lack of Transportation (Non-Medical): No  Physical Activity: Inactive (02/14/2022)   Exercise Vital Sign    Days of Exercise per Week: 0 days    Minutes of Exercise per Session: 0 min  Stress: Not on file  Social Connections: Not on file     Family History: The patient's family history includes Cancer in her father and maternal grandfather; Diabetes in her father, maternal grandfather, and mother; Heart disease in her mother; Hyperlipidemia in her father and mother; Hypertension in her brother, father, maternal grandfather, and mother; Parkinson's disease in her maternal grandmother.  ROS:   Please see the history of present illness.    All other systems reviewed and are negative.  EKGs/Labs/Other Studies Reviewed:    24-HR Blood Pressure Monitor (03/22/2022):  24 Hour Ambulatory Blood Pressure Report   Overall Average BP: 145/96 Overall Average HR: 80   Awake Average BP: 147/99 Awake Average HR: 81  Asleep Average BP: 125/79 Asleep Average HR: 78  White Coat Period: Max 151/107, mean 150/104   Impressions: Uncontrolled hypertension with an appropriate nocturnal dip.    TTE 02/22/2022: IMPRESSIONS    1. Left ventricular ejection fraction, by estimation, is 70 to 75%. The  left ventricle has hyperdynamic function with no significant intracavitary  gradient on this exam. The left ventricle has no regional wall motion  abnormalities, appears grossly  normal despite image quality. There is mild left ventricular hypertrophy.  Left ventricular diastolic parameters were normal.   2. Right ventricular systolic function is normal. The right ventricular  size is normal. Tricuspid regurgitation signal is inadequate for assessing  PA pressure.   3. The mitral valve is normal in structure. No evidence of mitral valve  regurgitation. No evidence of mitral stenosis.   4. The aortic valve is tricuspid. Aortic valve regurgitation is not  visualized. No aortic stenosis is present.   5. The inferior vena cava is normal in size with greater than 50%  respiratory variability, suggesting right atrial pressure of 3 mmHg.   CTA Neck 11/09/15: FINDINGS: Aortic arch: Visualized aortic arch of normal caliber with normal branch pattern. No high-grade stenosis at the origin of the great vessels. Subclavian arteries patent  bilaterally.   Right carotid system: Right common carotid artery patent from its origin to the bifurcation. Right ICA widely patent from the bifurcation to the skullbase. No stenosis, dissection, or vascular occlusion.   Left carotid system: Left common carotid artery patent from its origin to the bifurcation. Left ICA widely patent from the bifurcation to the skullbase. No dissection, stenosis, or vascular occlusion.   Vertebral arteries:Both vertebral arteries arise from the subclavian arteries. Vertebral arteries widely patent without stenosis, dissection, or occlusion.   Skeleton: No acute osseous abnormality. No worrisome lytic or blastic osseous lesions.   Other neck: Visualized lungs are clear. Visualized superior mediastinum demonstrates no  acute abnormality. Approximately 15 mm hypodense nodule within the left lobe of thyroid, indeterminate.   Enlarged right level 2a lymph node measures approximately 2 cm in short axis. Multiple additional right level 2B nodes present more posteriorly within the right neck measuring up to 12 mm. Associated inflammatory stranding within this region. Few additional right level 2/3 nodes present more inferiorly measuring up to 8 mm. Few level 5 B nodes also present, measuring up to 1 cm. Right-sided supraclavicular nodes measure up to 8 mm. Findings may be reactive in nature. Suppurative adenitis could have this appearance given the inflammatory stranding. The internal jugular veins not well evaluated on this exam due to timing of the contrast bolus, but are grossly patent. No significant left-sided adenopathy.   Probable retention cyst noted within the left sphenoid sinus.   IMPRESSION: 1. Normal CTA of the neck. 2. Extensive bulky right-sided cervical adenopathy with associated inflammatory stranding, consistent with suppurative adenitis/reactive nodal disease. Clinical followup to resolution recommended. No jugular vein thrombosis identified on this exam, although evaluation somewhat limited by timing of the contrast bolus. 3. Approximate 15 mm heterogeneous left thyroid nodule, indeterminate. Further evaluation with dedicated thyroid ultrasound recommended. This could be performed on a nonemergent basis.  EKG: EKG was personally reviewed  06/08/22: EKG was not ordered.  02/14/22: Sinus tachycardia. Rate 115 bpm.  Recent Labs: No results found for requested labs within last 365 days.   Recent Lipid Panel No results found for: "CHOL", "TRIG", "HDL", "CHOLHDL", "VLDL", "LDLCALC", "LDLDIRECT"  Physical Exam:    VS:  There were no vitals taken for this visit. , BMI There is no height or weight on file to calculate BMI. GENERAL:  Well appearing HEENT: Pupils equal round and  reactive, fundi not visualized, oral mucosa unremarkable NECK:  No jugular venous distention, waveform within normal limits, carotid upstroke brisk and symmetric, no bruits, no thyromegaly LUNGS:  Clear to auscultation bilaterally HEART:  RRR.  PMI not displaced or sustained,S1 and S2 within normal limits, no S3, no S4, no clicks, no rubs, no murmurs ABD:  Flat, positive bowel sounds normal in frequency in pitch, no bruits, no rebound, no guarding, no midline pulsatile mass, no hepatomegaly, no splenomegaly EXT:  2 plus pulses throughout, no edema, no cyanosis no clubbing SKIN:  No rashes no nodules NEURO:  Cranial nerves II through XII grossly intact, motor grossly intact throughout PSYCH:  Cognitively intact, oriented to person place and time  ASSESSMENT/PLAN:    No problem-specific Assessment & Plan notes found for this encounter.    Screening for Secondary Hypertension:     02/14/2022    4:03 PM  Causes  Drugs/Herbals Screened     - Comments limits salt. occasional wine.  occasional cigars  Renovascular HTN N/A  Sleep Apnea Screened  Thyroid Disease Screened  Hyperaldosteronism N/A  Pheochromocytoma  N/A  Cushing's Syndrome N/A    Relevant Labs/Studies:    Latest Ref Rng & Units 08/30/2018    3:43 PM 08/17/2018    6:46 PM 08/11/2018    3:57 PM  Basic Labs  Sodium 135 - 145 mmol/L 135  136    Potassium 3.5 - 5.1 mmol/L 3.8  3.7    Creatinine 0.44 - 1.00 mg/dL 0.70  0.65  0.57        Latest Ref Rng & Units 11/10/2015    3:51 AM 10/29/2012    3:20 PM  Thyroid   TSH 0.350 - 4.500 uIU/mL 4.447  0.860       Disposition:    FU with Tiffany C. Oval Linsey, MD, [facility] in 2-3 months.  Medication Adjustments/Labs and Tests Ordered: Current medicines are reviewed at length with the patient today.  Concerns regarding medicines are outlined above.   No orders of the defined types were placed in this encounter.  No orders of the defined types were placed in this  encounter.  I,Mitra Faeizi,acting as a Education administrator for [place], MD.,have documented all relevant documentation on the behalf of Skeet Latch, MD,as directed by  Skeet Latch, MD while in the presence of Skeet Latch, MD.   I, [place] Oval Linsey, MD have reviewed all documentation for this visit.  The documentation of the exam, diagnosis, procedures, and orders on 09/19/2022 are all accurate and complete.  Waynetta Pean  09/19/2022 4:41 PM    [facility]

## 2022-09-22 ENCOUNTER — Ambulatory Visit (HOSPITAL_BASED_OUTPATIENT_CLINIC_OR_DEPARTMENT_OTHER): Payer: 59 | Admitting: Cardiovascular Disease

## 2022-10-26 ENCOUNTER — Encounter (HOSPITAL_BASED_OUTPATIENT_CLINIC_OR_DEPARTMENT_OTHER): Payer: Self-pay | Admitting: Cardiovascular Disease

## 2022-10-26 ENCOUNTER — Ambulatory Visit (HOSPITAL_BASED_OUTPATIENT_CLINIC_OR_DEPARTMENT_OTHER): Payer: 59 | Admitting: Cardiovascular Disease

## 2022-10-26 VITALS — BP 129/86 | HR 89 | Ht 68.0 in | Wt 264.7 lb

## 2022-10-26 DIAGNOSIS — E7800 Pure hypercholesterolemia, unspecified: Secondary | ICD-10-CM | POA: Diagnosis not present

## 2022-10-26 DIAGNOSIS — I10 Essential (primary) hypertension: Secondary | ICD-10-CM

## 2022-10-26 MED ORDER — BISOPROLOL FUMARATE 10 MG PO TABS: 10.0000 mg | ORAL_TABLET | Freq: Every day | ORAL | 3 refills | Status: AC

## 2022-10-26 NOTE — Progress Notes (Deleted)
Advanced Hypertension Clinic Follow-up Assessment:    Date:  06/08/2022   ID:  Nicole Alexander, DOB 09-03-1975, MRN [id number]  PCP:  Nicole James, MD  Cardiologist:  None  Nephrologist:  Referring MD: Nicole James, MD   CC: Hypertension  History of Present Illness:    Nicole Alexander is a 47 y.o. female with a hx of hypertension, here for follow-up. She was initially seen in the [facility] on 02/14/2022. Referral notes from Dr. Cherly Alexander personally reviewed. She saw Dr. Cherly Alexander 09/2021 and her BP was 144/100 on amlodipine, so she was referred to the [facility].  Ms. Horne was given a 24-hour ambulatory monitor and an echocardiogram was ordered.  Her ambulatory BP monitor 03/2022 showed uncontrolled BP readings at home with an average of 145/96. It was recommended to start HCTZ 25 mg. She had an echo on 02/22/2022 which revealed LVEF 70-75% with hyperdynamic ventricular function and mild LVH. We recommended adding carvedilol 12.5 mg. She noted that she was not taking amlodipine while wearing her monitor.   At her visit 05/2022, blood pressure remains slightly above goal and she continued to have palpitations so carvedilol was increased.   Past Medical History:  Diagnosis Date   Cervical lymphadenitis 11/25/2015   Essential hypertension 02/14/2022   Pure hypercholesterolemia 06/08/2022   Ruptured right tubal ectopic pregnancy causing hemoperitoneum 08/30/2018   Tachycardia 02/14/2022    Past Surgical History:  Procedure Laterality Date   BREAST SURGERY     breast reduction   CHOLECYSTECTOMY N/A 11/28/2012   Procedure: LAPAROSCOPIC CHOLECYSTECTOMY WITH INTRAOPERATIVE CHOLANGIOGRAM;  Surgeon: Nicole Malady, MD;  Location: [facility];  Service: General;  Laterality: N/A;   DIAGNOSTIC LAPAROSCOPY WITH REMOVAL OF ECTOPIC PREGNANCY N/A 08/30/2018   Procedure: DIAGNOSTIC LAPAROSCOPY WITH REMOVAL OF ECTOPIC PREGNANCY;  Surgeon: Nicole Evans,  MD;  Location: [facility];  Service: Gynecology;  Laterality: N/A;   LAPAROSCOPIC UNILATERAL SALPINGECTOMY Right 08/30/2018   Procedure: LAPAROSCOPIC UNILATERAL SALPINGECTOMY;  Surgeon: Nicole Evans, MD;  Location: [facility];  Service: Gynecology;  Laterality: Right;   TONSILLECTOMY      Current Medications: Current Meds  Medication Sig   acetaminophen (TYLENOL) 500 MG tablet Take 1 tablet (500 mg total) by mouth every 6 (six) hours as needed for mild pain or moderate pain.   amLODipine (NORVASC) 5 MG tablet Take 5 mg by mouth daily.   atorvastatin (LIPITOR) 10 MG tablet Take 10 mg by mouth daily.   cholecalciferol (VITAMIN D) 1000 units tablet Take 1,000 Units by mouth daily.   folic acid (FOLVITE) 1 MG tablet Take 1 mg by mouth daily.   ibuprofen (ADVIL) 200 MG tablet Take 3 tablets (600 mg total) by mouth every 6 (six) hours as needed for moderate pain.   Probiotic Product (PROBIOTIC-10 PO) Take 1 capsule by mouth daily.   [DISCONTINUED] carvedilol (COREG) 12.5 MG tablet Take 1 tablet (12.5 mg total) by mouth 2 (two) times daily.     Allergies:   Penicillins and Sulfa antibiotics   Social History   Socioeconomic History   Marital status: Married    Spouse name: Not on file   Number of children: Not on file   Years of education: Not on file   Highest education level: Not on file  Occupational History   Not on file  Tobacco Use   Smoking status: Former    Packs/day: 0.50    Years: 4.00    Total pack years: 2.00    Types:  Cigarettes    Quit date: 07/18/1998    Years since quitting: 23.9   Smokeless tobacco: Never  Substance and Sexual Activity   Alcohol use: Not Currently    Comment: occasional   Drug use: No   Sexual activity: Yes    Birth control/protection: None    Comment: married since 12-2003  Other Topics Concern   Not on file  Social History Narrative   Not on file   Social Determinants of Health   Financial Resource Strain: Low Risk  (02/14/2022)   Overall  Financial Resource Strain (CARDIA)    Difficulty of Paying Living Expenses: Not hard at all  Food Insecurity: No Food Insecurity (02/14/2022)   Hunger Vital Sign    Worried About Running Out of Food in the Last Year: Never true    Ran Out of Food in the Last Year: Never true  Transportation Needs: No Transportation Needs (02/14/2022)   PRAPARE - Administrator, Civil Service (Medical): No    Lack of Transportation (Non-Medical): No  Physical Activity: Inactive (02/14/2022)   Exercise Vital Sign    Days of Exercise per Week: 0 days    Minutes of Exercise per Session: 0 min  Stress: Not on file  Social Connections: Not on file     Family History: The patient's family history includes Cancer in her father and maternal grandfather; Diabetes in her father, maternal grandfather, and mother; Heart disease in her mother; Hyperlipidemia in her father and mother; Hypertension in her brother, father, maternal grandfather, and mother; Parkinson's disease in her maternal grandmother.  ROS:   Please see the history of present illness.    All other systems reviewed and are negative.  EKGs/Labs/Other Studies Reviewed:    24-HR Blood Pressure Monitor (03/22/2022):  24 Hour Ambulatory Blood Pressure Report   Overall Average BP: 145/96 Overall Average HR: 80   Awake Average BP: 147/99 Awake Average HR: 81  Asleep Average BP: 125/79 Asleep Average HR: 78  White Coat Period: Max 151/107, mean 150/104   Impressions: Uncontrolled hypertension with an appropriate nocturnal dip.   TTE 02/22/2022: IMPRESSIONS    1. Left ventricular ejection fraction, by estimation, is 70 to 75%. The  left ventricle has hyperdynamic function with no significant intracavitary  gradient on this exam. The left ventricle has no regional wall motion  abnormalities, appears grossly  normal despite image quality. There is mild left ventricular hypertrophy.  Left ventricular diastolic parameters were normal.    2. Right ventricular systolic function is normal. The right ventricular  size is normal. Tricuspid regurgitation signal is inadequate for assessing  PA pressure.   3. The mitral valve is normal in structure. No evidence of mitral valve  regurgitation. No evidence of mitral stenosis.   4. The aortic valve is tricuspid. Aortic valve regurgitation is not  visualized. No aortic stenosis is present.   5. The inferior vena cava is normal in size with greater than 50%  respiratory variability, suggesting right atrial pressure of 3 mmHg.   CTA Neck 11/09/15: FINDINGS: Aortic arch: Visualized aortic arch of normal caliber with normal branch pattern. No high-grade stenosis at the origin of the great vessels. Subclavian arteries patent bilaterally.   Right carotid system: Right common carotid artery patent from its origin to the bifurcation. Right ICA widely patent from the bifurcation to the skullbase. No stenosis, dissection, or vascular occlusion.   Left carotid system: Left common carotid artery patent from its origin to the bifurcation. Left ICA  widely patent from the bifurcation to the skullbase. No dissection, stenosis, or vascular occlusion.   Vertebral arteries:Both vertebral arteries arise from the subclavian arteries. Vertebral arteries widely patent without stenosis, dissection, or occlusion.   Skeleton: No acute osseous abnormality. No worrisome lytic or blastic osseous lesions.   Other neck: Visualized lungs are clear. Visualized superior mediastinum demonstrates no acute abnormality. Approximately 15 mm hypodense nodule within the left lobe of thyroid, indeterminate.   Enlarged right level 2a lymph node measures approximately 2 cm in short axis. Multiple additional right level 2B nodes present more posteriorly within the right neck measuring up to 12 mm. Associated inflammatory stranding within this region. Few additional right level 2/3 nodes present more inferiorly  measuring up to 8 mm. Few level 5 B nodes also present, measuring up to 1 cm. Right-sided supraclavicular nodes measure up to 8 mm. Findings may be reactive in nature. Suppurative adenitis could have this appearance given the inflammatory stranding. The internal jugular veins not well evaluated on this exam due to timing of the contrast bolus, but are grossly patent. No significant left-sided adenopathy.   Probable retention cyst noted within the left sphenoid sinus.   IMPRESSION: 1. Normal CTA of the neck. 2. Extensive bulky right-sided cervical adenopathy with associated inflammatory stranding, consistent with suppurative adenitis/reactive nodal disease. Clinical followup to resolution recommended. No jugular vein thrombosis identified on this exam, although evaluation somewhat limited by timing of the contrast bolus. 3. Approximate 15 mm heterogeneous left thyroid nodule, indeterminate. Further evaluation with dedicated thyroid ultrasound recommended. This could be performed on a nonemergent basis.  EKG: EKG was personally reviewed  06/08/22: EKG was not ordered.  02/14/22: Sinus tachycardia. Rate 115 bpm.  Recent Labs: No results found for requested labs within last 365 days.   Recent Lipid Panel No results found for: "CHOL", "TRIG", "HDL", "CHOLHDL", "VLDL", "LDLCALC", "LDLDIRECT"  Physical Exam:    VS:  BP 122/86 (BP Location: Left Arm, Patient Position: Sitting, Cuff Size: Large)   Pulse 84   Ht 5\' 8"  (1.727 m)   Wt 270 lb 12.8 oz (122.8 kg)   SpO2 99%   BMI 41.17 kg/m  , BMI Body mass index is 41.17 kg/m. GENERAL:  Well appearing HEENT: Pupils equal round and reactive, fundi not visualized, oral mucosa unremarkable NECK:  No jugular venous distention, waveform within normal limits, carotid upstroke brisk and symmetric, no bruits, no thyromegaly LUNGS:  Clear to auscultation bilaterally HEART:  RRR.  PMI not displaced or sustained,S1 and S2 within normal limits, no  S3, no S4, no clicks, no rubs, no murmurs ABD:  Flat, positive bowel sounds normal in frequency in pitch, no bruits, no rebound, no guarding, no midline pulsatile mass, no hepatomegaly, no splenomegaly EXT:  2 plus pulses throughout, no edema, no cyanosis no clubbing SKIN:  No rashes no nodules NEURO:  Cranial nerves II through XII grossly intact, motor grossly intact throughout PSYCH:  Cognitively intact, oriented to person place and time  ASSESSMENT/PLAN:    Essential hypertension Blood pressure still above her goal of <130/80.  She hasn't been checking it regularly at home but notes that it was good when she saw her PCP.  Continue amlodipine.  She notes that her heart rate still races some when she exercises and this limits her exercise capacity.  We will increase carvedilol to 25 mg twice daily.  Keep working on diet and exercise.  Pure hypercholesterolemia She was recently started on atorvastatin by her PCP.  Agree with  this choice.  Tachycardia She had inappropriate sinus tachycardia.  Increasing carvedilol as above.  She notes improvement in her symptoms since starting it.   Screening for Secondary Hypertension:     02/14/2022    4:03 PM  Causes  Drugs/Herbals Screened     - Comments limits salt. occasional wine.  occasional cigars  Renovascular HTN N/A  Sleep Apnea Screened  Thyroid Disease Screened  Hyperaldosteronism N/A  Pheochromocytoma N/A  Cushing's Syndrome N/A    Relevant Labs/Studies:    Latest Ref Rng & Units 08/30/2018    3:43 PM 08/17/2018    6:46 PM 08/11/2018    3:57 PM  Basic Labs  Sodium 135 - 145 mmol/L 135  136    Potassium 3.5 - 5.1 mmol/L 3.8  3.7    Creatinine 0.44 - 1.00 mg/dL 9.74  1.63  8.45        Latest Ref Rng & Units 11/10/2015    3:51 AM 10/29/2012    3:20 PM  Thyroid   TSH 0.350 - 4.500 uIU/mL 4.447  0.860       Disposition:    FU with Devlin Mcveigh C. Duke Salvia, MD, [facility] in 2-3 months.  Medication Adjustments/Labs and Tests  Ordered: Current medicines are reviewed at length with the patient today.  Concerns regarding medicines are outlined above.   No orders of the defined types were placed in this encounter.  Meds ordered this encounter  Medications   carvedilol (COREG) 25 MG tablet    Sig: Take 1 tablet (25 mg total) by mouth 2 (two) times daily.    Dispense:  180 tablet    Refill:  3    NEW DOSE, D/C 12.5 MG RX   I,Mitra Faeizi,acting as a scribe for [employer], MD.,have documented all relevant documentation on the behalf of Chilton Si, MD,as directed by  Chilton Si, MD while in the presence of Chilton Si, MD.   I, Cahterine Heinzel C. Duke Salvia, MD have reviewed all documentation for this visit.  The documentation of the exam, diagnosis, procedures, and orders on 06/08/2022 are all accurate and complete.  Signed, Chilton Si, MD  06/08/2022 10:28 AM    [facility]

## 2022-10-26 NOTE — Progress Notes (Signed)
Advanced Hypertension Clinic Follow-up Assessment:    Date:  12/06/2022   ID:  Nicole Alexander, DOB January 06, 1976, MRN [id number]  PCP:  Deatra James, MD  Cardiologist:  None  Nephrologist:  Referring MD: Deatra James, MD   CC: Hypertension  History of Present Illness:    Nicole Alexander is a 47 y.o. female with a hx of hypertension, here for follow-up. She was initially seen in the [facility] on 02/14/2022. Referral notes from Dr. Cherly Hensen personally reviewed. She saw Dr. Cherly Hensen 09/2021 and her BP was 144/100 on amlodipine, so she was referred to the Advanced Hypertension Clinic.  Nicole Alexander was given a 24-hour ambulatory monitor and an echocardiogram was ordered.  Her ambulatory BP monitor 03/2022 showed uncontrolled BP readings at home with an average of 145/96. It was recommended to start HCTZ 25 mg. She had an echo on 02/22/2022 which revealed LVEF 70-75% with hyperdynamic ventricular function and mild LVH. We recommended adding carvedilol 12.5 mg. She noted that she was not taking amlodipine while wearing her monitor.   At her visit 05/2022, blood pressure remains slightly above goal and she continued to have palpitations so carvedilol was increased. Today, she states she is feeling okay for the most part. She has had multiple life stressors including the recent passing of her brother and moving to a new home. Understandably her diet and exercise routines have suffered for the past several months. She is now working on returning to a more healthy diet. During the weekends she sometimes struggles with remembering to take her medications. Her blood pressures increase as a result. This is due to a less strict schedule (may sleep in), as well as forgetting the evening dose more easily since most of her medications she takes in the morning. Otherwise her blood pressure is more controlled during the week. She denies any recent issues with palpitations. She denies any  chest pain, shortness of breath, or peripheral edema. No lightheadedness, headaches, syncope, orthopnea, or PND.   Past Medical History:  Diagnosis Date   Cervical lymphadenitis 11/25/2015   Essential hypertension 02/14/2022   Pure hypercholesterolemia 06/08/2022   Ruptured right tubal ectopic pregnancy causing hemoperitoneum 08/30/2018   Tachycardia 02/14/2022    Past Surgical History:  Procedure Laterality Date   BREAST SURGERY     breast reduction   CHOLECYSTECTOMY N/A 11/28/2012   Procedure: LAPAROSCOPIC CHOLECYSTECTOMY WITH INTRAOPERATIVE CHOLANGIOGRAM;  Surgeon: Liz Malady, MD;  Location: [facility];  Service: General;  Laterality: N/A;   DIAGNOSTIC LAPAROSCOPY WITH REMOVAL OF ECTOPIC PREGNANCY N/A 08/30/2018   Procedure: DIAGNOSTIC LAPAROSCOPY WITH REMOVAL OF ECTOPIC PREGNANCY;  Surgeon: Shea Evans, MD;  Location: [facility];  Service: Gynecology;  Laterality: N/A;   LAPAROSCOPIC UNILATERAL SALPINGECTOMY Right 08/30/2018   Procedure: LAPAROSCOPIC UNILATERAL SALPINGECTOMY;  Surgeon: Shea Evans, MD;  Location: [facility];  Service: Gynecology;  Laterality: Right;   TONSILLECTOMY      Current Medications: Current Meds  Medication Sig   acetaminophen (TYLENOL) 500 MG tablet Take 1 tablet (500 mg total) by mouth every 6 (six) hours as needed for mild pain or moderate pain.   amLODipine (NORVASC) 5 MG tablet Take 5 mg by mouth daily.   atorvastatin (LIPITOR) 10 MG tablet Take 10 mg by mouth daily.   bisoprolol (ZEBETA) 10 MG tablet Take 1 tablet (10 mg total) by mouth daily.   cholecalciferol (VITAMIN D) 1000 units tablet Take 1,000 Units by mouth daily.   folic acid (FOLVITE) 1 MG tablet Take  1 mg by mouth daily.   ibuprofen (ADVIL) 200 MG tablet Take 3 tablets (600 mg total) by mouth every 6 (six) hours as needed for moderate pain.   Probiotic Product (PROBIOTIC-10 PO) Take 1 capsule by mouth daily.   [DISCONTINUED] carvedilol (COREG) 25 MG tablet Take 1 tablet (25 mg total) by  mouth 2 (two) times daily.     Allergies:   Penicillins and Sulfa antibiotics   Social History   Socioeconomic History   Marital status: Married    Spouse name: Not on file   Number of children: Not on file   Years of education: Not on file   Highest education level: Not on file  Occupational History   Not on file  Tobacco Use   Smoking status: Former    Packs/day: 0.50    Years: 4.00    Additional pack years: 0.00    Total pack years: 2.00    Types: Cigarettes    Quit date: 07/18/1998    Years since quitting: 24.4   Smokeless tobacco: Never  Substance and Sexual Activity   Alcohol use: Not Currently    Comment: occasional   Drug use: No   Sexual activity: Yes    Birth control/protection: None    Comment: married since 12-2003  Other Topics Concern   Not on file  Social History Narrative   Not on file   Social Determinants of Health   Financial Resource Strain: Low Risk  (02/14/2022)   Overall Financial Resource Strain (CARDIA)    Difficulty of Paying Living Expenses: Not hard at all  Food Insecurity: No Food Insecurity (02/14/2022)   Hunger Vital Sign    Worried About Running Out of Food in the Last Year: Never true    Ran Out of Food in the Last Year: Never true  Transportation Needs: No Transportation Needs (02/14/2022)   PRAPARE - Administrator, Civil Service (Medical): No    Lack of Transportation (Non-Medical): No  Physical Activity: Inactive (02/14/2022)   Exercise Vital Sign    Days of Exercise per Week: 0 days    Minutes of Exercise per Session: 0 min  Stress: Not on file  Social Connections: Not on file     Family History: The patient's family history includes Cancer in her father and maternal grandfather; Diabetes in her father, maternal grandfather, and mother; Heart disease in her mother; Hyperlipidemia in her father and mother; Hypertension in her brother, father, maternal grandfather, and mother; Parkinson's disease in her maternal  grandmother.  ROS:   Please see the history of present illness.    All other systems reviewed and are negative.  EKGs/Labs/Other Studies Reviewed:    24-HR Blood Pressure Monitor (03/22/2022):  24 Hour Ambulatory Blood Pressure Report   Overall Average BP: 145/96 Overall Average HR: 80   Awake Average BP: 147/99 Awake Average HR: 81  Asleep Average BP: 125/79 Asleep Average HR: 78  White Coat Period: Max 151/107, mean 150/104   Impressions: Uncontrolled hypertension with an appropriate nocturnal dip.   TTE 02/22/2022: IMPRESSIONS    1. Left ventricular ejection fraction, by estimation, is 70 to 75%. The  left ventricle has hyperdynamic function with no significant intracavitary  gradient on this exam. The left ventricle has no regional wall motion  abnormalities, appears grossly  normal despite image quality. There is mild left ventricular hypertrophy.  Left ventricular diastolic parameters were normal.   2. Right ventricular systolic function is normal. The right ventricular  size  is normal. Tricuspid regurgitation signal is inadequate for assessing  PA pressure.   3. The mitral valve is normal in structure. No evidence of mitral valve  regurgitation. No evidence of mitral stenosis.   4. The aortic valve is tricuspid. Aortic valve regurgitation is not  visualized. No aortic stenosis is present.   5. The inferior vena cava is normal in size with greater than 50%  respiratory variability, suggesting right atrial pressure of 3 mmHg.   CTA Neck 11/09/15: IMPRESSION: 1. Normal CTA of the neck. 2. Extensive bulky right-sided cervical adenopathy with associated inflammatory stranding, consistent with suppurative adenitis/reactive nodal disease. Clinical followup to resolution recommended. No jugular vein thrombosis identified on this exam, although evaluation somewhat limited by timing of the contrast bolus. 3. Approximate 15 mm heterogeneous left thyroid  nodule, indeterminate. Further evaluation with dedicated thyroid ultrasound recommended. This could be performed on a nonemergent basis.  EKG: EKG was personally reviewed  10/26/2022:  EKG was not ordered. 06/08/22: EKG was not ordered.  02/14/22: Sinus tachycardia. Rate 115 bpm.  Recent Labs: No results found for requested labs within last 365 days.   Recent Lipid Panel No results found for: "CHOL", "TRIG", "HDL", "CHOLHDL", "VLDL", "LDLCALC", "LDLDIRECT"  Physical Exam:    VS:  BP 129/86 (BP Location: Left Arm, Patient Position: Sitting, Cuff Size: Large)   Pulse 89   Ht 5\' 8"  (1.727 m)   Wt 264 lb 11.2 oz (120.1 kg)   SpO2 97%   BMI 40.25 kg/m  , BMI Body mass index is 40.25 kg/m. GENERAL:  Well appearing HEENT: Pupils equal round and reactive, fundi not visualized, oral mucosa unremarkable NECK:  No jugular venous distention, waveform within normal limits, carotid upstroke brisk and symmetric, no bruits, no thyromegaly LUNGS:  Clear to auscultation bilaterally HEART:  RRR.  PMI not displaced or sustained,S1 and S2 within normal limits, no S3, no S4, no clicks, no rubs, no murmurs ABD:  Flat, positive bowel sounds normal in frequency in pitch, no bruits, no rebound, no guarding, no midline pulsatile mass, no hepatomegaly, no splenomegaly EXT:  2 plus pulses throughout, no edema, no cyanosis no clubbing SKIN:  No rashes no nodules NEURO:  Cranial nerves II through XII grossly intact, motor grossly intact throughout PSYCH:  Cognitively intact, oriented to person place and time  ASSESSMENT/PLAN:    Essential hypertension Blood pressure has been much better controlled.  However she struggles with remembering to take her blood pressure medications more than once per day.  Will have her stop carvedilol and do bisoprolol 10 mg daily.  Continue amlodipine.  She will check her blood pressures at home and let us know if it starts to be over 130/80.  She is going to work on getting back  into her exercise routine.  Pure hypercholesterolemia Continue atorvastatin.  Increase exercise as above.  Will need to reassess fasting lipids and CMP at follow-up.   Screening for Secondary Hypertension:     02/14/2022    4:03 PM  Causes  Drugs/Herbals Screened     - Comments limits salt. occasional wine.  occasional cigars  Renovascular HTN N/A  Sleep Apnea Screened  Thyroid Disease Screened  Hyperaldosteronism N/A  Pheochromocytoma N/A  Cushing's Syndrome N/A    Relevant Labs/Studies:    Latest Ref Rng & Units 08/30/2018    3:43 PM 08/17/2018    6:46 PM 08/11/2018    3:57 PM  Basic Labs  Sodium 135 - 145 mmol/L 135  136  Potassium 3.5 - 5.1 mmol/L 3.8  3.7    Creatinine 0.44 - 1.00 mg/dL 9.52  8.41  3.24        Latest Ref Rng & Units 11/10/2015    3:51 AM 10/29/2012    3:20 PM  Thyroid   TSH 0.350 - 4.500 uIU/mL 4.447  0.860       Disposition:    FU with [place] C. Duke Salvia, MD, [facility] in 6 months.  Medication Adjustments/Labs and Tests Ordered: Current medicines are reviewed at length with the patient today.  Concerns regarding medicines are outlined above.   No orders of the defined types were placed in this encounter.  Meds ordered this encounter  Medications   bisoprolol (ZEBETA) 10 MG tablet    Sig: Take 1 tablet (10 mg total) by mouth daily.    Dispense:  90 tablet    Refill:  3    D/C CARVEDILOL   I,Mathew Stumpf,acting as a scribe for Chilton Si, MD.,have documented all relevant documentation on the behalf of Chilton Si, MD,as directed by  Chilton Si, MD while in the presence of Chilton Si, MD.  I, Saisha Hogue C. Duke Salvia, MD have reviewed all documentation for this visit.  The documentation of the exam, diagnosis, procedures, and orders on 12/06/2022 are all accurate and complete.  Signed, Chilton Si, MD  12/06/2022 12:03 PM    [facility]

## 2022-10-26 NOTE — Assessment & Plan Note (Signed)
Blood pressure has been much better controlled.  However she struggles with remembering to take her blood pressure medications more than once per day.  Will have her stop carvedilol and do bisoprolol 10 mg daily.  Continue amlodipine.  She will check her blood pressures at home and let us know if it starts to be over 130/80.  She is going to work on getting back into her exercise routine.

## 2022-10-26 NOTE — Assessment & Plan Note (Signed)
Continue atorvastatin.  Increase exercise as above.  Will need to reassess fasting lipids and CMP at follow-up.

## 2022-10-26 NOTE — Patient Instructions (Addendum)
Medication Instructions:  STOP CARVEDILOL   START BISOPROLOL 10 MG DAILY   Labwork: NONE  Testing/Procedures: NONE  Follow-Up: 04/11/2023 3:45 pm with Dr Duke Salvia   Any Other Special Instructions Will Be Listed Below (If Applicable).  MONITOR YOUR BLOOD PRESSURE AT HOME. IF IT STARTS TO GO ABOVE 130/80 CALL THE OFFICE   If you need a refill on your cardiac medications before your next appointment, please call your pharmacy.

## 2022-11-10 ENCOUNTER — Ambulatory Visit (HOSPITAL_BASED_OUTPATIENT_CLINIC_OR_DEPARTMENT_OTHER): Payer: 59 | Admitting: Cardiovascular Disease

## 2022-12-06 ENCOUNTER — Encounter (HOSPITAL_BASED_OUTPATIENT_CLINIC_OR_DEPARTMENT_OTHER): Payer: Self-pay | Admitting: Cardiovascular Disease

## 2023-04-11 ENCOUNTER — Encounter (HOSPITAL_BASED_OUTPATIENT_CLINIC_OR_DEPARTMENT_OTHER): Payer: Self-pay | Admitting: Cardiovascular Disease

## 2023-04-11 ENCOUNTER — Ambulatory Visit (HOSPITAL_BASED_OUTPATIENT_CLINIC_OR_DEPARTMENT_OTHER): Payer: 59 | Admitting: Cardiovascular Disease

## 2023-04-11 VITALS — BP 130/84 | HR 75 | Ht 68.0 in | Wt 266.0 lb

## 2023-04-11 DIAGNOSIS — R000 Tachycardia, unspecified: Secondary | ICD-10-CM

## 2023-04-11 DIAGNOSIS — I10 Essential (primary) hypertension: Secondary | ICD-10-CM | POA: Diagnosis not present

## 2023-04-11 DIAGNOSIS — R609 Edema, unspecified: Secondary | ICD-10-CM | POA: Diagnosis not present

## 2023-04-11 DIAGNOSIS — E7800 Pure hypercholesterolemia, unspecified: Secondary | ICD-10-CM | POA: Diagnosis not present

## 2023-04-11 NOTE — Progress Notes (Signed)
Advanced Hypertension Clinic Follow-up Assessment:    Date:  04/12/2023   ID:  Nicole Alexander, DOB 01-21-1976, MRN [id number]  PCP:  Deatra James, MD  Cardiologist:  None  Nephrologist:  Referring MD: Deatra James, MD   CC: Hypertension  History of Present Illness:    Nicole Alexander is a 47 y.o. female with a hx of hypertension, here for follow-up. She was initially seen in the [facility] on 02/14/2022. Referral notes from Dr. Cherly Hensen personally reviewed. She saw Dr. Cherly Hensen 09/2021 and her BP was 144/100 on amlodipine, so she was referred to the [facility].  Nicole Alexander was given a 24-hour ambulatory monitor and an echocardiogram was ordered.  Her ambulatory BP monitor 03/2022 showed uncontrolled BP readings at home with an average of 145/96. It was recommended to start HCTZ 25 mg. She had an echo on 02/22/2022 which revealed LVEF 70-75% with hyperdynamic ventricular function and mild LVH. We recommended adding carvedilol 12.5 mg. She noted that she was not taking amlodipine while wearing her monitor.   At her visit 05/2022, blood pressure remained slightly above goal and she continued to have palpitations so carvedilol was increased.  At her visit 10/2022 she was under a lot of stress after her brother passed and moving homes. She was less active and her BP had increased. She switched from carvedilol to bisoprolol and was encouraged to start back exercising.   Today, her main complaint is pain and swelling in her right knee for a few days which more recently progressed to a sharp pain in her right calf. 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 For pain management she has been taking tylenol for a few days. Her home blood pressures have been stable, similar to her reading of 130/84 in the office. When she is able to exercise she usually goes  walking. She denies any anginal symptoms. Her breathing has been stable. She denies any palpitations, chest pain, shortness of breath, peripheral edema, lightheadedness, headaches, syncope, orthopnea, or PND.   Past Medical History:  Diagnosis Date   Cervical lymphadenitis 11/25/2015   Essential hypertension 02/14/2022   Pure hypercholesterolemia 06/08/2022   Ruptured right tubal ectopic pregnancy causing hemoperitoneum 08/30/2018   Tachycardia 02/14/2022    Past Surgical History:  Procedure Laterality Date   BREAST SURGERY     breast reduction   CHOLECYSTECTOMY N/A 11/28/2012   Procedure: LAPAROSCOPIC CHOLECYSTECTOMY WITH INTRAOPERATIVE CHOLANGIOGRAM;  Surgeon: Liz Malady, MD;  [facility];  Service: General;  Laterality: N/A;   DIAGNOSTIC LAPAROSCOPY WITH REMOVAL OF ECTOPIC PREGNANCY N/A 08/30/2018   Procedure: DIAGNOSTIC LAPAROSCOPY WITH REMOVAL OF ECTOPIC PREGNANCY;  Surgeon: Shea Evans, MD;  Location: [facility];  Service: Gynecology;  Laterality: N/A;   LAPAROSCOPIC UNILATERAL SALPINGECTOMY Right 08/30/2018   Procedure: LAPAROSCOPIC UNILATERAL SALPINGECTOMY;  Surgeon: Shea Evans, MD;  Location: [facility];  Service: Gynecology;  Laterality: Right;   TONSILLECTOMY      Current Medications: Current Meds  Medication Sig   acetaminophen (TYLENOL) 500 MG tablet Take 1 tablet (500 mg total) by mouth every 6 (six) hours as needed for mild pain or moderate pain.   amLODipine (NORVASC) 5 MG tablet Take 5 mg by mouth daily.   atorvastatin (LIPITOR) 10 MG tablet Take 10 mg by mouth daily.   bisoprolol (ZEBETA) 10 MG tablet Take 1 tablet (10 mg total) by mouth daily.  cholecalciferol (VITAMIN D) 1000 units tablet Take 1,000 Units by mouth daily.   folic acid (FOLVITE) 1 MG tablet Take 1 mg by mouth daily.   ibuprofen (ADVIL) 200 MG tablet Take 3 tablets (600 mg total) by mouth every 6 (six) hours as needed for moderate pain.   Probiotic Product (PROBIOTIC-10 PO) Take 1 capsule by  mouth daily.     Allergies:   Penicillins and Sulfa antibiotics   Social History   Socioeconomic History   Marital status: Married    Spouse name: Not on file   Number of children: Not on file   Years of education: Not on file   Highest education level: Not on file  Occupational History   Not on file  Tobacco Use   Smoking status: Former    Current packs/day: 0.00    Average packs/day: 0.5 packs/day for 4.0 years (2.0 ttl pk-yrs)    Types: Cigarettes    Start date: 07/18/1994    Quit date: 07/18/1998    Years since quitting: 24.7   Smokeless tobacco: Never  Substance and Sexual Activity   Alcohol use: Not Currently    Comment: occasional   Drug use: No   Sexual activity: Yes    Birth control/protection: None    Comment: married since 12-2003  Other Topics Concern   Not on file  Social History Narrative   Not on file   Social Determinants of Health   Financial Resource Strain: Low Risk  (02/14/2022)   Overall Financial Resource Strain (CARDIA)    Difficulty of Paying Living Expenses: Not hard at all  Food Insecurity: No Food Insecurity (02/14/2022)   Hunger Vital Sign    Worried About Running Out of Food in the Last Year: Never true    Ran Out of Food in the Last Year: Never true  Transportation Needs: No Transportation Needs (02/14/2022)   PRAPARE - Administrator, Civil Service (Medical): No    Lack of Transportation (Non-Medical): No  Physical Activity: Inactive (02/14/2022)   Exercise Vital Sign    Days of Exercise per Week: 0 days    Minutes of Exercise per Session: 0 min  Stress: Not on file  Social Connections: Not on file     Family History: The patient's family history includes Cancer in her father and maternal grandfather; Diabetes in her father, maternal grandfather, and mother; Heart disease in her mother; Hyperlipidemia in her father and mother; Hypertension in her brother, father, maternal grandfather, and mother; Parkinson's disease in her  maternal grandmother.  ROS:   Please see the history of present illness.    (+) Right calf pain All other systems reviewed and are negative.  EKGs/Labs/Other Studies Reviewed:    24-HR Blood Pressure Monitor (03/22/2022):  24 Hour Ambulatory Blood Pressure Report   Overall Average BP: 145/96 Overall Average HR: 80   Awake Average BP: 147/99 Awake Average HR: 81  Asleep Average BP: 125/79 Asleep Average HR: 78  White Coat Period: Max 151/107, mean 150/104   Impressions: Uncontrolled hypertension with an appropriate nocturnal dip.   TTE 02/22/2022: IMPRESSIONS   1. Left ventricular ejection fraction, by estimation, is 70 to 75%. The  left ventricle has hyperdynamic function with no significant intracavitary  gradient on this exam. The left ventricle has no regional wall motion  abnormalities, appears grossly  normal despite image quality. There is mild left ventricular hypertrophy.  Left ventricular diastolic parameters were normal.   2. Right ventricular systolic function is normal.  The right ventricular  size is normal. Tricuspid regurgitation signal is inadequate for assessing  PA pressure.   3. The mitral valve is normal in structure. No evidence of mitral valve  regurgitation. No evidence of mitral stenosis.   4. The aortic valve is tricuspid. Aortic valve regurgitation is not  visualized. No aortic stenosis is present.   5. The inferior vena cava is normal in size with greater than 50%  respiratory variability, suggesting right atrial pressure of 3 mmHg.   CTA Neck 11/09/15: IMPRESSION: 1. Normal CTA of the neck. 2. Extensive bulky right-sided cervical adenopathy with associated inflammatory stranding, consistent with suppurative adenitis/reactive nodal disease. Clinical followup to resolution recommended. No jugular vein thrombosis identified on this exam, although evaluation somewhat limited by timing of the contrast bolus. 3. Approximate 15 mm heterogeneous  left thyroid nodule, indeterminate. Further evaluation with dedicated thyroid ultrasound recommended. This could be performed on a nonemergent basis.  EKG: EKG was personally reviewed  04/11/2023:  Sinus rhythm. Rate 75 bpm. 10/26/2022:  EKG was not ordered. 06/08/22: EKG was not ordered.  02/14/22: Sinus tachycardia. Rate 115 bpm.  Recent Labs: No results found for requested labs within last 365 days.   Recent Lipid Panel No results found for: "CHOL", "TRIG", "HDL", "CHOLHDL", "VLDL", "LDLCALC", "LDLDIRECT"  Physical Exam:    VS:  BP 130/84 (BP Location: Left Arm, Patient Position: Sitting, Cuff Size: Large)   Pulse 75   Ht 5\' 8"  (1.727 m)   Wt 266 lb (120.7 kg)   SpO2 98%   BMI 40.45 kg/m  , BMI Body mass index is 40.45 kg/m. GENERAL:  Well appearing HEENT: Pupils equal round and reactive, fundi not visualized, oral mucosa unremarkable NECK:  No jugular venous distention, waveform within normal limits, carotid upstroke brisk and symmetric, no bruits, no thyromegaly LUNGS:  Clear to auscultation bilaterally HEART:  RRR.  PMI not displaced or sustained,S1 and S2 within normal limits, no S3, no S4, no clicks, no rubs, no murmurs ABD:  Flat, positive bowel sounds normal in frequency in pitch, no bruits, no rebound, no guarding, no midline pulsatile mass, no hepatomegaly, no splenomegaly.  EXT:  2 plus pulses throughout, no edema, no cyanosis no clubbing. Reproducible tenderness of right calf. SKIN:  No rashes no nodules NEURO:  Cranial nerves II through XII grossly intact, motor grossly intact throughout PSYCH:  Cognitively intact, oriented to person place and time  ASSESSMENT/PLAN:    # Right Calf Pain New onset, sharp pain with some swelling. No recent trauma. 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 Concern for possible deep vein thrombosis (DVT) given the symptoms. -Order venous ultrasound of right lower extremity to rule out DVT.  # Hypertension Well  controlled on current regimen of Amlodipine and Bisoprolol. -Continue Amlodipine and Bisoprolol. -Refill A[facility]odipine prescription and [facility] to [facility].  # Hyperlipidemia Managed by Dr. Wynelle Link. No recent labs available in current record. -Request recent cholesterol labs from Dr. Chase Caller office. -Continue atorvastatin  # Follow-up Patient's hypertension and hyperlipidemia appear well controlled. -Schedule follow-up appointment in 1 year. -Discuss potential "graduation" from hypertension clinic at next visit if control remains good.      Screening for Secondary Hypertension:     02/14/2022    4:03 PM  Causes  Drugs/Herbals Screened     - Comments limits salt. occasional wine.  occasional cigars  Renovascular HTN N/A  Sleep Apnea Screened  Thyroid Disease Screened  Hyperaldosteronism N/A  Pheochromocytoma N/A  Cushing's Syndrome N/A  Relevant Labs/Studies:    Latest Ref Rng & Units 08/30/2018    3:43 PM 08/17/2018    6:46 PM 08/11/2018    3:57 PM  Basic Labs  Sodium 135 - 145 mmol/L 135  136    Potassium 3.5 - 5.1 mmol/L 3.8  3.7    Creatinine 0.44 - 1.00 mg/dL 4.09  8.11  9.14        Latest Ref Rng & Units 11/10/2015    3:51 AM 10/29/2012    3:20 PM  Thyroid   TSH 0.350 - 4.500 uIU/mL 4.447  0.860      Disposition:    FU with Da Authement C. Duke Salvia, MD, [facility] in 1 year.   Medication Adjustments/Labs and Tests Ordered: Current medicines are reviewed at length with the patient today.  Concerns regarding medicines are outlined above.   Orders Placed This Encounter  Procedures   EKG 12-Lead   VAS [place] LOWER EXTREMITY VENOUS (DVT)   No orders of the defined types were placed in this encounter.  I,Mathew Stumpf,acting as a Neurosurgeon for Chilton Si, MD.,have documented all relevant documentation on the behalf of Chilton Si, MD,as directed by  Chilton Si, MD while in the presence of Chilton Si, MD.  I, Kristi Hyer C. Duke Salvia, MD have reviewed all documentation for  this visit.  The documentation of the exam, diagnosis, procedures, and orders on 04/12/2023 are all accurate and complete.  Signed, Chilton Si, MD  04/12/2023 9:55 AM    [facility]

## 2023-04-11 NOTE — Patient Instructions (Signed)
Medication Instructions:  Your physician recommends that you continue on your current medications as directed. Please refer to the Current Medication list given to you today. Testing/Procedures:  Your provider has recommended a right lower extremity doppler to rule out DVT.    Follow-Up: 1 year follow up with Dr. Duke Salvia in [facility]

## 2023-04-11 NOTE — Progress Notes (Deleted)
Advanced Hypertension Clinic Follow-up Assessment:    Date:  04/11/2023   ID:  Nicole Alexander, DOB [birth year], MRN [id number]  PCP:  Deatra James, MD  Cardiologist:  None  Nephrologist:  Referring MD: Deatra James, MD   CC: Hypertension  History of Present Illness:    Nicole Alexander is a 47 y.o. female with a hx of hypertension, here for follow-up. She was initially seen in the [facility] Hypertension Clinic on 02/14/2022. Referral notes from Dr. Cherly Hensen personally reviewed. She saw Dr. Cherly Hensen 09/2021 and her BP was 144/100 on amlodipine, so she was referred to the Advanced Hypertension Clinic.  Nicole Alexander was given a 24-hour ambulatory monitor and an echocardiogram was ordered.  Her ambulatory BP monitor 03/2022 showed uncontrolled BP readings at home with an average of 145/96. It was recommended to start HCTZ 25 mg. She had an echo on 02/22/2022 which revealed LVEF 70-75% with hyperdynamic ventricular function and mild LVH. We recommended adding carvedilol 12.5 mg. She noted that she was not taking amlodipine while wearing her monitor.   At her visit 05/2022, blood pressure remains slightly above goal and she continued to have palpitations so carvedilol was increased.  At her visit 10/2022 she was under a lot of stress after her brother passed and moving homes.  She was less active and her BP had increased. She switched from carvedilol to bisoprolol and encouraged to start back exercising.     Past Medical History:  Diagnosis Date   Cervical lymphadenitis 11/25/2015   Essential hypertension 02/14/2022   Pure hypercholesterolemia 06/08/2022   Ruptured right tubal ectopic pregnancy causing hemoperitoneum 08/30/2018   Tachycardia 02/14/2022    Past Surgical History:  Procedure Laterality Date   BREAST SURGERY     breast reduction   CHOLECYSTECTOMY N/A 11/28/2012   Procedure: LAPAROSCOPIC CHOLECYSTECTOMY WITH INTRAOPERATIVE CHOLANGIOGRAM;  Surgeon: Liz Malady, MD;   Location: [facility] OR;  Service: General;  Laterality: N/A;   DIAGNOSTIC LAPAROSCOPY WITH REMOVAL OF ECTOPIC PREGNANCY N/A 08/30/2018   Procedure: DIAGNOSTIC LAPAROSCOPY WITH REMOVAL OF ECTOPIC PREGNANCY;  Surgeon: Shea Evans, MD;  Location: [facility];  Service: Gynecology;  Laterality: N/A;   LAPAROSCOPIC UNILATERAL SALPINGECTOMY Right 08/30/2018   Procedure: LAPAROSCOPIC UNILATERAL SALPINGECTOMY;  Surgeon: Shea Evans, MD;  Location: [facility];  Service: Gynecology;  Laterality: Right;   TONSILLECTOMY      Current Medications: No outpatient medications have been marked as taking for the 04/11/23 encounter (Appointment) with Chilton Si, MD.     Allergies:   Penicillins and Sulfa antibiotics   Social History   Socioeconomic History   Marital status: Married    Spouse name: Not on file   Number of children: Not on file   Years of education: Not on file   Highest education level: Not on file  Occupational History   Not on file  Tobacco Use   Smoking status: Former    Current packs/day: 0.00    Average packs/day: 0.5 packs/day for 4.0 years (2.0 ttl pk-yrs)    Types: Cigarettes    Start date: 07/18/1994    Quit date: 07/18/1998    Years since quitting: 24.7   Smokeless tobacco: Never  Substance and Sexual Activity   Alcohol use: Not Currently    Comment: occasional   Drug use: No   Sexual activity: Yes    Birth control/protection: None    Comment: married since 12-2003  Other Topics Concern   Not on file  Social History Narrative   Not  on file   Social Determinants of Health   Financial Resource Strain: Low Risk  (02/14/2022)   Overall Financial Resource Strain (CARDIA)    Difficulty of Paying Living Expenses: Not hard at all  Food Insecurity: No Food Insecurity (02/14/2022)   Hunger Vital Sign    Worried About Running Out of Food in the Last Year: Never true    Ran Out of Food in the Last Year: Never true  Transportation Needs: No Transportation Needs (02/14/2022)    PRAPARE - Administrator, Civil Service (Medical): No    Lack of Transportation (Non-Medical): No  Physical Activity: Inactive (02/14/2022)   Exercise Vital Sign    Days of Exercise per Week: 0 days    Minutes of Exercise per Session: 0 min  Stress: Not on file  Social Connections: Not on file     Family History: The patient's family history includes Cancer in her father and maternal grandfather; Diabetes in her father, maternal grandfather, and mother; Heart disease in her mother; Hyperlipidemia in her father and mother; Hypertension in her brother, father, maternal grandfather, and mother; Parkinson's disease in her maternal grandmother.  ROS:   Please see the history of present illness.    All other systems reviewed and are negative.  EKGs/Labs/Other Studies Reviewed:    24-HR Blood Pressure Monitor (03/22/2022):  24 Hour Ambulatory Blood Pressure Report   Overall Average BP: 145/96 Overall Average HR: 80   Awake Average BP: 147/99 Awake Average HR: 81  Asleep Average BP: 125/79 Asleep Average HR: 78  White Coat Period: Max 151/107, mean 150/104   Impressions: Uncontrolled hypertension with an appropriate nocturnal dip.   TTE 02/22/2022: IMPRESSIONS    1. Left ventricular ejection fraction, by estimation, is 70 to 75%. The  left ventricle has hyperdynamic function with no significant intracavitary  gradient on this exam. The left ventricle has no regional wall motion  abnormalities, appears grossly  normal despite image quality. There is mild left ventricular hypertrophy.  Left ventricular diastolic parameters were normal.   2. Right ventricular systolic function is normal. The right ventricular  size is normal. Tricuspid regurgitation signal is inadequate for assessing  PA pressure.   3. The mitral valve is normal in structure. No evidence of mitral valve  regurgitation. No evidence of mitral stenosis.   4. The aortic valve is tricuspid. Aortic valve  regurgitation is not  visualized. No aortic stenosis is present.   5. The inferior vena cava is normal in size with greater than 50%  respiratory variability, suggesting right atrial pressure of 3 mmHg.   CTA Neck 11/09/15: IMPRESSION: 1. Normal CTA of the neck. 2. Extensive bulky right-sided cervical adenopathy with associated inflammatory stranding, consistent with suppurative adenitis/reactive nodal disease. Clinical followup to resolution recommended. No jugular vein thrombosis identified on this exam, although evaluation somewhat limited by timing of the contrast bolus. 3. Approximate 15 mm heterogeneous left thyroid nodule, indeterminate. Further evaluation with dedicated thyroid ultrasound recommended. This could be performed on a nonemergent basis.  EKG: EKG was personally reviewed  10/26/2022:  EKG was not ordered. 06/08/22: EKG was not ordered.  02/14/22: Sinus tachycardia. Rate 115 bpm.  Recent Labs: No results found for requested labs within last 365 days.   Recent Lipid Panel No results found for: "CHOL", "TRIG", "HDL", "CHOLHDL", "VLDL", "LDLCALC", "LDLDIRECT"  Physical Exam:    VS:  There were no vitals taken for this visit. , BMI There is no height or weight on file to  calculate BMI. GENERAL:  Well appearing HEENT: Pupils equal round and reactive, fundi not visualized, oral mucosa unremarkable NECK:  No jugular venous distention, waveform within normal limits, carotid upstroke brisk and symmetric, no bruits, no thyromegaly LUNGS:  Clear to auscultation bilaterally HEART:  RRR.  PMI not displaced or sustained,S1 and S2 within normal limits, no S3, no S4, no clicks, no rubs, no murmurs ABD:  Flat, positive bowel sounds normal in frequency in pitch, no bruits, no rebound, no guarding, no midline pulsatile mass, no hepatomegaly, no splenomegaly EXT:  2 plus pulses throughout, no edema, no cyanosis no clubbing SKIN:  No rashes no nodules NEURO:  Cranial nerves II  through XII grossly intact, motor grossly intact throughout PSYCH:  Cognitively intact, oriented to person place and time  ASSESSMENT/PLAN:    No problem-specific Assessment & Plan notes found for this encounter.    Screening for Secondary Hypertension:     02/14/2022    4:03 PM  Causes  Drugs/Herbals Screened     - Comments limits salt. occasional wine.  occasional cigars  Renovascular HTN N/A  Sleep Apnea Screened  Thyroid Disease Screened  Hyperaldosteronism N/A  Pheochromocytoma N/A  Cushing's Syndrome N/A    Relevant Labs/Studies:    Latest Ref Rng & Units 08/30/2018    3:43 PM 08/17/2018    6:46 PM 08/11/2018    3:57 PM  Basic Labs  Sodium 135 - 145 mmol/L 135  136    Potassium 3.5 - 5.1 mmol/L 3.8  3.7    Creatinine 0.44 - 1.00 mg/dL 6.57  8.46  9.62        Latest Ref Rng & Units 11/10/2015    3:51 AM 10/29/2012    3:20 PM  Thyroid   TSH 0.350 - 4.500 uIU/mL 4.447  0.860       Disposition:    FU with Adelle Zachar C. Duke Salvia, MD, [facility] in 6 months.  Medication Adjustments/Labs and Tests Ordered: Current medicines are reviewed at length with the patient today.  Concerns regarding medicines are outlined above.   No orders of the defined types were placed in this encounter.  No orders of the defined types were placed in this encounter.  I,Mathew Stumpf,acting as a Neurosurgeon for Chilton Si, MD.,have documented all relevant documentation on the behalf of Chilton Si, MD,as directed by  Chilton Si, MD while in the presence of Chilton Si, MD.  I, Levis Nazir C. Duke Salvia, MD have reviewed all documentation for this visit.  The documentation of the exam, diagnosis, procedures, and orders on 04/11/2023 are all accurate and complete.  Signed, Chilton Si, MD  04/11/2023 8:27 AM    [facility]

## 2023-04-12 ENCOUNTER — Encounter (HOSPITAL_BASED_OUTPATIENT_CLINIC_OR_DEPARTMENT_OTHER): Payer: Self-pay | Admitting: Cardiovascular Disease

## 2023-04-18 ENCOUNTER — Ambulatory Visit (HOSPITAL_COMMUNITY)
Admission: RE | Admit: 2023-04-18 | Discharge: 2023-04-18 | Disposition: A | Discharge status: Home / Self Care | Payer: 59 | Source: Ambulatory Visit | Attending: Cardiology | Admitting: Cardiology

## 2023-04-18 DIAGNOSIS — R609 Edema, unspecified: Secondary | ICD-10-CM

## 2023-04-19 ENCOUNTER — Telehealth (HOSPITAL_BASED_OUTPATIENT_CLINIC_OR_DEPARTMENT_OTHER): Payer: Self-pay | Admitting: Cardiovascular Disease

## 2023-04-19 MED ORDER — AMLODIPINE BESYLATE 5 MG PO TABS: 5.0000 mg | ORAL_TABLET | Freq: Every day | ORAL | 3 refills | Status: AC

## 2023-04-19 NOTE — Telephone Encounter (Signed)
*  STAT* If patient is at the pharmacy, call can be transferred to refill team.   1. Which medications need to be refilled? (please list name of each medication and dose if known)  Amlodipine and Folic Acid   2. Would you like to learn more about the convenience, safety, & potential cost savings by using the [facility]?      3. Are you open to using the [facility] (Type [facility]..   4. Which pharmacy/location (including street and city if local pharmacy) is medication to be sent to? [facility] [address] [address]   5. Do they need a 30 day or 90 day supply?  30 days and refills

## 2023-04-19 NOTE — Telephone Encounter (Signed)
Advised patient could send in Amlodipine but would need to get Folic Acid from PCP, verbalized understanding

## 2023-04-25 ENCOUNTER — Telehealth (HOSPITAL_BASED_OUTPATIENT_CLINIC_OR_DEPARTMENT_OTHER): Payer: Self-pay

## 2023-04-25 NOTE — Telephone Encounter (Addendum)
Results called to patient who verbalizes understanding!   ----- Message from [facility] sent at 04/25/2023  3:53 PM EDT ----- There are no blood clots in the legs.

## 2023-12-29 ENCOUNTER — Encounter: Payer: Self-pay | Admitting: Diagnostic Neuroimaging

## 2023-12-29 ENCOUNTER — Ambulatory Visit: Admitting: Diagnostic Neuroimaging

## 2023-12-29 VITALS — BP 138/95 | HR 69 | Ht 69.0 in | Wt 270.0 lb

## 2023-12-29 DIAGNOSIS — G250 Essential tremor: Secondary | ICD-10-CM

## 2023-12-29 NOTE — Progress Notes (Signed)
 [facility]  PATIENT: Nicole Alexander DOB: 08/05/1975  REFERRING CLINICIAN: Sun, Vyvyan, MD HISTORY FROM: patient  REASON FOR VISIT: new consult   HISTORICAL  CHIEF COMPLAINT:  Chief Complaint  Patient presents with   New Patient (Initial Visit)    Pt alone, rm 7. She noticed about 5-6 months ago she developed tremor in head. States that intermittently comes and goes. If not daily she notices every other day and can alter on how often occurs throughout the day. No imaging completed.     HISTORY OF PRESENT ILLNESS:   48 year old female here for evaluation of head tremors.  January 2025 started having onset of mild intermittent head tremors lasting 20 to 30 seconds at a time.  No specific triggering aggravating factors.  Symptoms can occur on a daily basis.  No tremor in hands, body or legs.  No gait or balance difficulty.  No family history of tremor.  Patient does have some history of mild anxiety, racing thoughts, difficulty sleeping for many years.  She averages 6 hours of sleep per night.  Sometimes she can stay up late on the weekends.  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   REVIEW OF SYSTEMS: Full 14 system review of systems performed and negative with exception of: as per HPI.  ALLERGIES: Allergies  Allergen Reactions   Penicillins Swelling    swole up like a balloon Has patient had a PCN reaction causing immediate rash, facial/tongue/throat swelling, SOB or lightheadedness with hypotension: YES Has patient had a PCN reaction causing severe rash involving mucus membranes or skin necrosis: NO Has patient had a PCN reaction that required hospitalization NO Has patient had a PCN reaction occurring within the last 10 years: NO If all of the above answers are NO, then may proceed with Cephalosporin use.   Sulfa Antibiotics     HOME MEDICATIONS: Outpatient Medications Prior to Visit  Medication  Sig Dispense Refill   acetaminophen  (TYLENOL ) 500 MG tablet Take 1 tablet (500 mg total) by mouth every 6 (six) hours as needed for mild pain or moderate pain. 30 tablet 0   amLODipine  (NORVASC ) 5 MG tablet Take 1 tablet (5 mg total) by mouth daily. 90 tablet 3   atorvastatin (LIPITOR) 10 MG tablet Take 10 mg by mouth daily.     bisoprolol  (ZEBETA ) 10 MG tablet Take 1 tablet (10 mg total) by mouth daily. 90 tablet 3   ibuprofen  (ADVIL ) 200 MG tablet Take 3 tablets (600 mg total) by mouth every 6 (six) hours as needed for moderate pain. 30 tablet 0   Probiotic Product (PROBIOTIC-10 PO) Take 1 capsule by mouth daily.     cholecalciferol (VITAMIN D) 1000 units tablet Take 1,000 Units by mouth daily.     folic acid (FOLVITE) 1 MG tablet Take 1 mg by mouth daily.     No facility-administered medications prior to visit.    PAST MEDICAL HISTORY: Past Medical History:  Diagnosis Date   Cervical lymphadenitis 11/25/2015   Essential hypertension 02/14/2022   Pure hypercholesterolemia 06/08/2022   Ruptured right tubal ectopic pregnancy causing hemoperitoneum 08/30/2018   Tachycardia 02/14/2022    PAST SURGICAL HISTORY: Past Surgical History:  Procedure Laterality Date   BREAST SURGERY     breast reduction   CHOLECYSTECTOMY N/A 11/28/2012   Procedure: LAPAROSCOPIC CHOLECYSTECTOMY WITH INTRAOPERATIVE CHOLANGIOGRAM;  Surgeon: Cloyce Darby, MD;  Location: [facility] OR;  Service: General;  Laterality: N/A;  DIAGNOSTIC LAPAROSCOPY WITH REMOVAL OF ECTOPIC PREGNANCY N/A 08/30/2018   Procedure: DIAGNOSTIC LAPAROSCOPY WITH REMOVAL OF ECTOPIC PREGNANCY;  Surgeon: Terri Fester, MD;  Location: [facility];  Service: Gynecology;  Laterality: N/A;   LAPAROSCOPIC UNILATERAL SALPINGECTOMY Right 08/30/2018   Procedure: LAPAROSCOPIC UNILATERAL SALPINGECTOMY;  Surgeon: Terri Fester, MD;  Location: [facility];  Service: Gynecology;  Laterality: Right;   TONSILLECTOMY      FAMILY HISTORY: Family History  Problem  Relation Age of Onset   Heart disease Mother        PTCA/stent   Hypertension Mother    Diabetes Mother    Hyperlipidemia Mother    Cancer Father        lung   Hypertension Father    Diabetes Father    Hyperlipidemia Father    Hypertension Brother    Parkinson's disease Maternal Grandmother    Hypertension Maternal Grandfather    Diabetes Maternal Grandfather    Cancer Maternal Grandfather        bone    SOCIAL HISTORY: Social History   Socioeconomic History   Marital status: Married    Spouse name: Not on file   Number of children: Not on file   Years of education: Not on file   Highest education level: Not on file  Occupational History   Not on file  Tobacco Use   Smoking status: Former    Current packs/day: 0.00    Average packs/day: 0.5 packs/day for 4.0 years (2.0 ttl pk-yrs)    Types: Cigarettes    Start date: 07/18/1994    Quit date: 07/18/1998    Years since quitting: 25.4   Smokeless tobacco: Never  Substance and Sexual Activity   Alcohol use: Not Currently    Comment: occasional   Drug use: No   Sexual activity: Yes    Birth control/protection: None    Comment: married since 12-2003  Other Topics Concern   Not on file  Social History Narrative   Not on file   Social Drivers of Health   Financial Resource Strain: Low Risk  (02/14/2022)   Overall Financial Resource Strain (CARDIA)    Difficulty of Paying Living Expenses: Not hard at all  Food Insecurity: No Food Insecurity (02/14/2022)   Hunger Vital Sign    Worried About Running Out of Food in the Last Year: Never true    Ran Out of Food in the Last Year: Never true  Transportation Needs: No Transportation Needs (02/14/2022)   PRAPARE - Administrator, Civil Service (Medical): No    Lack of Transportation (Non-Medical): No  Physical Activity: Inactive (02/14/2022)   Exercise Vital Sign    Days of Exercise per Week: 0 days    Minutes of Exercise per Session: 0 min  Stress: Not on file   Social Connections: Not on file  Intimate Partner Violence: Not on file     PHYSICAL EXAM  GENERAL EXAM/CONSTITUTIONAL: Vitals:  Vitals:   12/29/23 1057  BP: (!) 138/95  Pulse: 69  Weight: 270 lb (122.5 kg)  Height: 5' 9 (1.753 m)   Body mass index is 39.87 kg/m. Wt Readings from Last 3 Encounters:  12/29/23 270 lb (122.5 kg)  04/11/23 266 lb (120.7 kg)  10/26/22 264 lb 11.2 oz (120.1 kg)   Patient is in no distress; well developed, nourished and groomed; neck is supple  CARDIOVASCULAR: Examination of carotid arteries is normal; no carotid bruits Regular rate and rhythm, no murmurs Examination of peripheral vascular system by  observation and palpation is normal  EYES: Ophthalmoscopic exam of optic discs and posterior segments is normal; no papilledema or hemorrhages No results found.  MUSCULOSKELETAL: Gait, strength, tone, movements noted in Neurologic exam below  NEUROLOGIC: MENTAL STATUS:      No data to display         awake, alert, oriented to person, place and time recent and remote memory intact normal attention and concentration language fluent, comprehension intact, naming intact fund of knowledge appropriate  CRANIAL NERVE:  2nd - no papilledema on fundoscopic exam 2nd, 3rd, 4th, 6th - pupils equal and reactive to light, visual fields full to confrontation, extraocular muscles intact, no nystagmus 5th - facial sensation symmetric 7th - facial strength symmetric 8th - hearing intact 9th - palate elevates symmetrically, uvula midline 11th - shoulder shrug symmetric 12th - tongue protrusion midline  MOTOR:  normal bulk and tone, full strength in the BUE, BLE  SENSORY:  normal and symmetric to light touch, temperature, vibration  COORDINATION:  finger-nose-finger, fine finger movements normal  REFLEXES:  deep tendon reflexes 1+ and symmetric  GAIT/STATION:  narrow based gait     DIAGNOSTIC DATA (LABS, IMAGING, TESTING) - I  reviewed patient records, labs, notes, testing and imaging myself where available.  Lab Results  Component Value Date   WBC 13.9 (H) 08/30/2018   HGB 10.7 (L) 08/30/2018   HCT 32.2 (L) 08/30/2018   MCV 87.3 08/30/2018   PLT 402 (H) 08/30/2018      Component Value Date/Time   NA 135 08/30/2018 1543   K 3.8 08/30/2018 1543   CL 104 08/30/2018 1543   CO2 21 (L) 08/30/2018 1543   GLUCOSE 109 (H) 08/30/2018 1543   BUN 7 08/30/2018 1543   CREATININE 0.70 08/30/2018 1543   CREATININE 0.92 10/29/2012 1519   CALCIUM 9.2 08/30/2018 1543   PROT 7.9 08/30/2018 1543   ALBUMIN 4.3 08/30/2018 1543   AST 25 08/30/2018 1543   ALT 36 08/30/2018 1543   ALKPHOS 47 08/30/2018 1543   BILITOT 0.6 08/30/2018 1543   GFRNONAA >60 08/30/2018 1543   GFRAA >60 08/30/2018 1543   No results found for: CHOL, HDL, LDLCALC, LDLDIRECT, TRIG, CHOLHDL No results found for: GNFA2Z No results found for: VITAMINB12 Lab Results  Component Value Date   TSH 4.447 11/10/2015    Aug 2024 TSH 2.37   ASSESSMENT AND PLAN  48 y.o. year old female here with:  Dx:  1. Benign head tremor     PLAN:  Mild intermittent head tremor (no tremor today; intermittent 20-30 seconds tremor; no triggers; enhanced physiologic tremor vs essential tremor) - mild symptoms; monitor for now - consider increased beta blocker or primidone if sxs significantly worsen  INSOMNIA/ ANXIETY - reviewed sleep hygiene - consider psychology evaluation  Return for return to PCP, pending if symptoms worsen or fail to improve.    Omega Bible, MD 12/29/2023, 11:24 AM Certified in Neurology, Neurophysiology and Neuroimaging  [facility] [address], [address] [place], [place] [address] [phone]

## 2023-12-29 NOTE — Patient Instructions (Addendum)
  Mild intermittent head tremor (no tremor today; intermittent 20-30 seconds tremor; no triggers; enhanced physiologic tremor vs essential tremor) - mild symptoms; monitor for now - consider increased beta blocker or primidone if sxs significantly worsen  INSOMNIA/ ANXIETY - reviewed sleep hygiene - consider psychology evaluation

## 2024-06-06 ENCOUNTER — Encounter: Payer: Self-pay | Admitting: Podiatry

## 2024-06-06 ENCOUNTER — Ambulatory Visit (INDEPENDENT_AMBULATORY_CARE_PROVIDER_SITE_OTHER)

## 2024-06-06 ENCOUNTER — Ambulatory Visit: Admitting: Podiatry

## 2024-06-06 DIAGNOSIS — M778 Other enthesopathies, not elsewhere classified: Secondary | ICD-10-CM

## 2024-06-06 DIAGNOSIS — G629 Polyneuropathy, unspecified: Secondary | ICD-10-CM

## 2024-06-06 DIAGNOSIS — M7752 Other enthesopathy of left foot: Secondary | ICD-10-CM

## 2024-06-06 DIAGNOSIS — M722 Plantar fascial fibromatosis: Secondary | ICD-10-CM

## 2024-06-06 DIAGNOSIS — M7672 Peroneal tendinitis, left leg: Secondary | ICD-10-CM

## 2024-06-06 MED ORDER — DICLOFENAC SODIUM 75 MG PO TBEC
75.0000 mg | DELAYED_RELEASE_TABLET | Freq: Two times a day (BID) | ORAL | 2 refills | Status: AC
Start: 2024-06-06 — End: ?

## 2024-06-06 MED ORDER — TRIAMCINOLONE ACETONIDE 10 MG/ML IJ SUSP
10.0000 mg | Freq: Once | INTRAMUSCULAR | Status: AC
  Administered 2024-06-06: 10 mg via INTRA_ARTICULAR

## 2024-06-06 NOTE — Patient Instructions (Signed)

## 2024-06-07 ENCOUNTER — Ambulatory Visit: Admitting: Podiatry

## 2024-06-07 NOTE — Progress Notes (Signed)
 Subjective:   Patient ID: Nicole Alexander, female   DOB: 48 y.o.   MRN: [id number]   HPI Patient presents with a lot of pain underneath the left heel and also concerned about some tingling she gets in both of her feet and states that the pain is worse in the heel but also seems to be getting pain in the outside of the foot more recently.  Patient does not smoke likes to be active   Review of Systems  All other systems reviewed and are negative.       Objective:  Physical Exam Vitals and nursing note reviewed.  Constitutional:      Appearance: She is well-developed.  Pulmonary:     Effort: Pulmonary effort is normal.  Musculoskeletal:        General: Normal range of motion.  Skin:    General: Skin is warm.  Neurological:     Mental Status: She is alert.     Neurovascular status was found to be intact with no indication of loss of sharp dull vibratory.  Patient is found to have exquisite discomfort plantar aspect left heel at the insertion tendon calcaneus and also has discomfort lateral ankle around the peroneal which is probably compensatory.  Low-grade tingling in both feet and that has been relatively recent and she does not remember injury and patient does  have good digital perfusion and is well-oriented     Assessment:  Acute plantar fasciitis left with probable compensation of the peroneal group and possibility for low-grade mild neuropathic like symptoms     Plan:  H&P all conditions reviewed sterile prep and injected the plantar fascia left 3 mg dexamethasone  Kenalog  5 mg Xylocaine  applied sterile dressing and discussed possible treatment of the peroneal or neuropathy if symptoms persist.  Dispensed fascial brace properly fitted into the arch to lift the arch and take stress off the heel and placed on oral diclofenac  75 mg twice daily.  Reappoint to recheck 2 weeks  X-rays indicate large plantar spur with moderate depression of the arch

## 2024-06-20 ENCOUNTER — Ambulatory Visit: Admitting: Podiatry

## 2024-06-20 ENCOUNTER — Encounter: Payer: Self-pay | Admitting: Podiatry

## 2024-06-20 VITALS — Ht 69.0 in | Wt 270.0 lb

## 2024-06-20 DIAGNOSIS — M7672 Peroneal tendinitis, left leg: Secondary | ICD-10-CM

## 2024-06-20 DIAGNOSIS — M722 Plantar fascial fibromatosis: Secondary | ICD-10-CM | POA: Diagnosis not present

## 2024-06-23 NOTE — Progress Notes (Signed)
 Subjective:   Patient ID: Nicole Alexander, female   DOB: 48 y.o.   MRN: [id number]   HPI Patient states she is improving left but still having some pain   ROS      Objective:  Physical Exam  Neurovascular status intact with improvement of discomfort of the left lateral foot still moderately painful but better than it was.  Patient is walking with a better heel-toe gait and is using brace     Assessment:  Improvement in peroneal tendinitis left moderate plantar fasciitis left     Plan:  H&P reviewed and I went ahead today advised on supportive shoes and ice therapy as needed.  Patient will be seen back as symptoms indicate may require further treatment
